# Patient Record
Sex: Male | Born: 1960 | Race: White | Hispanic: No | Smoking: Never smoker
Health system: Southern US, Community
[De-identification: ages and names within clinical notes are randomized; demographics above are authoritative.]

## PROBLEM LIST (undated history)

## (undated) ENCOUNTER — Emergency Department (HOSPITAL_COMMUNITY): Admission: EM | Payer: Self-pay | Source: Home / Self Care

## (undated) DIAGNOSIS — F419 Anxiety disorder, unspecified: Secondary | ICD-10-CM

## (undated) DIAGNOSIS — J90 Pleural effusion, not elsewhere classified: Secondary | ICD-10-CM

## (undated) DIAGNOSIS — Z9889 Other specified postprocedural states: Secondary | ICD-10-CM

## (undated) DIAGNOSIS — I10 Essential (primary) hypertension: Secondary | ICD-10-CM

## (undated) DIAGNOSIS — M51379 Other intervertebral disc degeneration, lumbosacral region without mention of lumbar back pain or lower extremity pain: Secondary | ICD-10-CM

## (undated) DIAGNOSIS — F32A Depression, unspecified: Secondary | ICD-10-CM

## (undated) DIAGNOSIS — F329 Major depressive disorder, single episode, unspecified: Secondary | ICD-10-CM

## (undated) DIAGNOSIS — I639 Cerebral infarction, unspecified: Secondary | ICD-10-CM

## (undated) DIAGNOSIS — K7290 Hepatic failure, unspecified without coma: Secondary | ICD-10-CM

## (undated) DIAGNOSIS — I429 Cardiomyopathy, unspecified: Secondary | ICD-10-CM

## (undated) DIAGNOSIS — F1011 Alcohol abuse, in remission: Secondary | ICD-10-CM

## (undated) DIAGNOSIS — K7460 Unspecified cirrhosis of liver: Secondary | ICD-10-CM

## (undated) DIAGNOSIS — I480 Paroxysmal atrial fibrillation: Secondary | ICD-10-CM

## (undated) DIAGNOSIS — M549 Dorsalgia, unspecified: Secondary | ICD-10-CM

## (undated) DIAGNOSIS — G8929 Other chronic pain: Secondary | ICD-10-CM

## (undated) DIAGNOSIS — E785 Hyperlipidemia, unspecified: Secondary | ICD-10-CM

## (undated) DIAGNOSIS — M5137 Other intervertebral disc degeneration, lumbosacral region: Secondary | ICD-10-CM

## (undated) HISTORY — DX: Other chronic pain: G89.29

## (undated) HISTORY — DX: Other intervertebral disc degeneration, lumbosacral region without mention of lumbar back pain or lower extremity pain: M51.379

## (undated) HISTORY — DX: Dorsalgia, unspecified: M54.9

## (undated) HISTORY — DX: Alcohol abuse, in remission: F10.11

## (undated) HISTORY — DX: Essential (primary) hypertension: I10

## (undated) HISTORY — DX: Major depressive disorder, single episode, unspecified: F32.9

## (undated) HISTORY — DX: Hyperlipidemia, unspecified: E78.5

## (undated) HISTORY — DX: Anxiety disorder, unspecified: F41.9

## (undated) HISTORY — DX: Other intervertebral disc degeneration, lumbosacral region: M51.37

## (undated) HISTORY — DX: Depression, unspecified: F32.A

## (undated) HISTORY — DX: Other specified postprocedural states: Z98.890

## (undated) HISTORY — DX: Cerebral infarction, unspecified: I63.9

---

## 1994-02-14 DIAGNOSIS — Z9889 Other specified postprocedural states: Secondary | ICD-10-CM

## 1994-02-14 HISTORY — PX: CARDIAC CATHETERIZATION: SHX172

## 1994-02-14 HISTORY — DX: Other specified postprocedural states: Z98.890

## 2001-02-14 DIAGNOSIS — I639 Cerebral infarction, unspecified: Secondary | ICD-10-CM

## 2001-02-14 HISTORY — DX: Cerebral infarction, unspecified: I63.9

## 2001-06-04 ENCOUNTER — Inpatient Hospital Stay (HOSPITAL_COMMUNITY): Admission: EM | Admit: 2001-06-04 | Discharge: 2001-06-05 | Payer: Self-pay | Admitting: *Deleted

## 2001-08-27 ENCOUNTER — Encounter: Payer: Self-pay | Admitting: *Deleted

## 2001-08-27 ENCOUNTER — Emergency Department (HOSPITAL_COMMUNITY): Admission: EM | Admit: 2001-08-27 | Discharge: 2001-08-27 | Payer: Self-pay | Admitting: *Deleted

## 2001-10-29 ENCOUNTER — Emergency Department (HOSPITAL_COMMUNITY): Admission: EM | Admit: 2001-10-29 | Discharge: 2001-10-29 | Payer: Self-pay | Admitting: Emergency Medicine

## 2002-07-02 ENCOUNTER — Emergency Department (HOSPITAL_COMMUNITY): Admission: EM | Admit: 2002-07-02 | Discharge: 2002-07-02 | Payer: Self-pay | Admitting: Emergency Medicine

## 2003-03-17 ENCOUNTER — Inpatient Hospital Stay (HOSPITAL_COMMUNITY): Admission: EM | Admit: 2003-03-17 | Discharge: 2003-03-18 | Payer: Self-pay | Admitting: Emergency Medicine

## 2003-03-21 ENCOUNTER — Ambulatory Visit (HOSPITAL_COMMUNITY): Admission: RE | Admit: 2003-03-21 | Discharge: 2003-03-21 | Payer: Self-pay | Admitting: Neurology

## 2007-07-24 ENCOUNTER — Emergency Department (HOSPITAL_COMMUNITY): Admission: EM | Admit: 2007-07-24 | Discharge: 2007-07-24 | Payer: Self-pay | Admitting: Emergency Medicine

## 2009-08-30 ENCOUNTER — Encounter: Payer: Self-pay | Admitting: Physician Assistant

## 2009-08-31 ENCOUNTER — Encounter: Payer: Self-pay | Admitting: Physician Assistant

## 2009-09-09 ENCOUNTER — Ambulatory Visit: Payer: Self-pay | Admitting: Family Medicine

## 2009-09-09 ENCOUNTER — Encounter: Payer: Self-pay | Admitting: Physician Assistant

## 2009-09-09 DIAGNOSIS — I10 Essential (primary) hypertension: Secondary | ICD-10-CM

## 2009-09-09 DIAGNOSIS — M5137 Other intervertebral disc degeneration, lumbosacral region: Secondary | ICD-10-CM

## 2009-09-09 DIAGNOSIS — R109 Unspecified abdominal pain: Secondary | ICD-10-CM | POA: Insufficient documentation

## 2009-09-09 DIAGNOSIS — R748 Abnormal levels of other serum enzymes: Secondary | ICD-10-CM | POA: Insufficient documentation

## 2009-09-11 ENCOUNTER — Ambulatory Visit: Payer: Self-pay | Admitting: Gastroenterology

## 2009-09-11 ENCOUNTER — Ambulatory Visit (HOSPITAL_COMMUNITY): Admission: RE | Admit: 2009-09-11 | Discharge: 2009-09-11 | Payer: Self-pay | Admitting: Internal Medicine

## 2009-09-11 ENCOUNTER — Encounter: Payer: Self-pay | Admitting: Gastroenterology

## 2009-09-14 ENCOUNTER — Encounter: Payer: Self-pay | Admitting: Gastroenterology

## 2009-09-15 ENCOUNTER — Telehealth: Payer: Self-pay | Admitting: Physician Assistant

## 2009-09-22 LAB — CONVERTED CEMR LAB
ALT: 91 units/L — ABNORMAL HIGH (ref 0–53)
AST: 125 units/L — ABNORMAL HIGH (ref 0–37)
Albumin: 4.7 g/dL (ref 3.5–5.2)
BUN: 14 mg/dL (ref 6–23)
CO2: 24 meq/L (ref 19–32)
Calcium: 9.4 mg/dL (ref 8.4–10.5)
Chloride: 99 meq/L (ref 96–112)
Creatinine, Ser: 1.05 mg/dL (ref 0.40–1.50)
HCV Ab: NEGATIVE
Hepatitis B Surface Ag: NEGATIVE
Potassium: 4.7 meq/L (ref 3.5–5.3)

## 2009-09-23 ENCOUNTER — Ambulatory Visit (HOSPITAL_COMMUNITY): Admission: RE | Admit: 2009-09-23 | Discharge: 2009-09-23 | Payer: Self-pay | Admitting: Gastroenterology

## 2009-09-23 ENCOUNTER — Ambulatory Visit: Payer: Self-pay | Admitting: Gastroenterology

## 2009-09-24 ENCOUNTER — Telehealth: Payer: Self-pay | Admitting: Physician Assistant

## 2009-09-30 ENCOUNTER — Ambulatory Visit: Payer: Self-pay | Admitting: Family Medicine

## 2009-10-01 ENCOUNTER — Ambulatory Visit (HOSPITAL_COMMUNITY): Admission: RE | Admit: 2009-10-01 | Discharge: 2009-10-01 | Payer: Self-pay | Admitting: Urology

## 2010-01-21 ENCOUNTER — Inpatient Hospital Stay (HOSPITAL_COMMUNITY): Admission: EM | Admit: 2010-01-21 | Discharge: 2009-09-01 | Payer: Self-pay | Admitting: Emergency Medicine

## 2010-03-18 NOTE — Progress Notes (Signed)
Summary: referral ortho  Phone Note Call from Patient   Summary of Call: can pt see ortho instead of neurosurgeon. Wants to see harrison. [phone] Initial call taken by: Rudene Anda,  September 15, 2009 8:24 AM  Follow-up for Phone Call        He could see Dr Romeo Apple, but I dont think he does the back injections like he had previously. If he wants to try the injections again then we could refer to Dr Eduard Clos.   Is his previous neurosurg no longer in practice? Follow-up by: Esperanza Sheets PA,  September 15, 2009 9:57 AM  Additional Follow-up for Phone Call Additional follow up Details #1::        called patient, left message Additional Follow-up by: Adella Hare LPN,  September 16, 2009 2:03 PM    Additional Follow-up for Phone Call Additional follow up Details #2::    called patient, left message Follow-up by: Adella Hare LPN,  September 21, 2009 8:28 AM

## 2010-03-18 NOTE — Assessment & Plan Note (Signed)
Summary: LEFT FLANK PAIN,ABNORMAL SERUM ENZYM LEVELS/CONSULT FOR TCS/SS   Visit Type:  Consult Referring Provider:  Syliva Overman Primary Care Provider:  Syliva Overman  Chief Complaint:  L side abd pain and goes around to back.  History of Present Illness: [name] is a pleasant 50 y/o WM, patient of Dr. Lodema Hong, who presents for further evaluation of left flank pain and abnormal LFTs. He was hospitalized a couple of weeks ago when the pain first started. He had CT which was abnormal with cecum in mid-abdomen and abnormal pneumatosis and adjacent mesenteric stranding. Appendix was normal. No free air. There was larger collection of gas along upper margin of cecum which could be luminal gas or pneumatosis. There was diverticula and fatty liver. Patient was seen by Dr. Caesar Bookman. No indication for surgery found but recommended to have colonoscopy.  Pain has persisted. It is no better. He notes pain in left flank. No dysuria or hematuria. BM 2 per day without melena, brbpr. No heartburn. No skin rash. He notes tremors since d/c from hospital. He wonders if it is from stress and pain. He has alcohol of heavy alcohol mostly on weekends. Drinks liquour (1/5th) and some beers. Drinks during the week some as well. Pain unrelated to meals. Sometimes worse with movement. H/O DDD, lumbar region but usually with back pain, pain center and radiated to buttocks.  Last alcohol yesterday, couple of mixed drinks. 24 ounces beer Tuesday. States his is stressed about recent loss of brother and son going to prison for extended period of time.  Current Medications (verified): 1)  Oxycodone-Acetaminophen 5-325 Mg Tabs (Oxycodone-Acetaminophen) .... Take 1 Every 6 Hrs As Needed For Pain 2)  Ibuprofen 200 Mg Tabs (Ibuprofen) .... As Needed  Allergies (verified): 1)  ! * Bee Stings  Past History:  Past Medical History: possible heart attack- 36 and 80 possible stroke- 2003 Hypertension DDD lumbar  spine  Past Surgical History: Heart cath- 96, Dr. Nicholaus Bloom  Family History: mother living- heart dz father deceased- cancer brother deceased- melanoma sister living- ? No FH of liver disease Paternal uncle, colon cancer  Social History: Self employed- odd jobs Single One grown child Never Smoked Alcohol use- weekends, 1/5 each week, couple beers on weekend. Drug use-no Regular exercise-yes  Review of Systems General:  Denies fever, chills, sweats, anorexia, fatigue, weakness, and weight loss. Eyes:  Denies vision loss. ENT:  Denies nasal congestion, sore throat, hoarseness, and difficulty swallowing. CV:  Denies chest pains, angina, palpitations, dyspnea on exertion, and peripheral edema. Resp:  Denies dyspnea at rest, dyspnea with exercise, cough, sputum, and wheezing. GI:  See HPI. GU:  Denies urinary burning, blood in urine, urinary frequency, and urinary hesitancy. MS:  Denies joint pain / LOM and low back pain. Derm:  Denies rash and itching. Neuro:  Denies weakness, frequent headaches, memory loss, and confusion. Psych:  Complains of anxiety; denies depression. Endo:  Denies unusual weight change. Heme:  Denies bruising and bleeding. Allergy:  Denies hives and rash.  Vital Signs:  Patient profile:   50 year old male Height:      76.5 inches Weight:      209 pounds BMI:     25.20 Temp:     98.2 degrees F oral Pulse rate:   80 / minute BP sitting:   128 / 88  (left arm) Cuff size:   regular  Vitals Entered By: Hendricks Limes LPN (September 11, 2009 11:22 AM)  Physical Exam  General:  Well developed,  well nourished, no acute distress. Head:  Normocephalic and atraumatic. Eyes:  Conjunctivae pink, no scleral icterus.  Mouth:  Oropharyngeal mucosa moist, pink.  No lesions, erythema or exudate.    Neck:  Supple; no masses or thyromegaly. Lungs:  Clear throughout to auscultation. Heart:  Regular rate and rhythm; no murmurs, rubs,  or bruits. Abdomen:  Soft. Pain in  left flank to palpation. No rebound or guarding. No CVA tenderness. No rash. No abd bruit or hernia, HSM or masses.  Rectal:  deferred until time of colonoscopy.   Extremities:  No clubbing, cyanosis, edema or deformities noted. Neurologic:  Alert and  oriented x4;  grossly normal neurologically. Skin:  Intact without significant lesions or rashes. Cervical Nodes:  No significant cervical adenopathy. Psych:  Alert and cooperative. Normal mood and affect.  Impression & Recommendations:  Problem # 1:  FLANK PAIN, LEFT (ICD-789.09)  Left flank pain unlikely related to GI source. Pain is unrelated to meals or BMs. ?referred pain from back. Given abnormal CT findings before and ongiong abd pain, will repeat CT prior to TCS.   Orders: Consultation Level IV (65784)  Problem # 2:  OTHER NONSPECIFIC ABNORMAL SERUM ENZYME LEVELS (ICD-790.5)  Likely secondary to ongoing alcohol use. Hep B and C markers are negative. Unlikely related to his pain. He does have fatty liver as well. Advise no further alcohol. Recheck LFTs in four weeks as planned by Dr. Lodema Hong.  Orders: Consultation Level IV (69629)  Problem # 3:  ALCOHOL ABUSE (ICD-305.00)  Patient with mild tremors today...persistent for couple weeks per his report. He desires to quit alcohol. Start Librium. No alcohol while using Librium. Advised to go to ED if tremors or pain worse.   Orders: Consultation Level IV ([id number]) Prescriptions: CHLORDIAZEPOXIDE HCL 25 MG CAPS (CHLORDIAZEPOXIDE HCL) one to two by mouth every 4 hours as needed anxiety/tremors. Do not exceed 10 per day. May cause drowsiness. Do not cosume alcohol.  #30 x 0   Entered and Authorized by:   [name]   Signed by:   [name] Battles Jenia Klepper PA-C on 09/11/2009   Method used:   Print then Give to Patient   RxID:   [id number]  I would like to thank Dr. Lodema Hong for allowing [place] to take part in the care of this nice patient.   Appended Document: LEFT FLANK  PAIN,ABNORMAL SERUM ENZYM LEVELS/CONSULT FOR TCS/SS Please schedule TCS with [facility] in OR due to h/o alcohol use. Reason for TCS, screening, left flank pain, abnormal CT. The sooner the better please.  Appended Document: LEFT FLANK PAIN,ABNORMAL SERUM ENZYM LEVELS/CONSULT FOR TCS/SS Spoke w/ pt.  Has appt w/ [facility] in OR on 8/10 @ 730.  Also aware of pre-op appt 8/8 @ 1230p.  Mother will be by to pick up RX and instructions per pt.  Appended Document: LEFT FLANK PAIN,ABNORMAL SERUM ENZYM LEVELS/CONSULT FOR TCS/SS Pt seen for pre-op. Pt anxious and shaking, BP 150/100, HR 90. Rx: Serax #10 1 by mouth q6h as needed anxiety, rfx0.

## 2010-03-18 NOTE — Assessment & Plan Note (Signed)
Summary: follow up - room 1   Vital Signs:  Patient profile:   50 year old male Height:      76.5 inches Weight:      216.75 pounds BMI:     26.13 O2 Sat:      99 % on Room air Pulse rate:   98 / minute Resp:     16 per minute BP sitting:   130 / 80  (left arm)  Vitals Entered By: Adella Hare LPN (September 30, 2009 1:17 PM) CC: follow-up visit Is Patient Diabetic? No Pain Assessment Patient in pain? yes     Location: left flank  Intensity: 6 Type: sharp Onset of pain  Constant Comments did not bring meds to ov   Referring Provider:  Syliva Overman Primary Provider:  Syliva Overman  CC:  follow-up visit.  History of Present Illness: Pt presents today for f/u. States he saw a urologist today.  Thinks he passed some kidney stones 5 days ago.  Still having intermittent Lt flank pain.  Per pt UA at urologist today nl.  Had blood work drawn & is having another CT scan tomorrow am.  He is out of pain meds.  Used them only when needed.  Pt has also seen GI and had colonoscopy done.  Is due for repeat liver enzymes in about 1 week.  Will need to see what was drawn at urologist office today to duplicate labs.  Pt states he has significantly cut back on alcohol.  Did drink about 10 beers this past Sat night.  Is struggling with the shakes when he doesnt drink.  He states that he doesnt feel like he is craving the alcohol, but more that he cant stand the shakes.  Dr Dian Situ prescribed Librium for him.  He states this really didnt help.  He did try his girlfriends Xanax though and this worked better for him.  No fever, nausea, vomiting or diarrhea.    Pt states he is taking a multivitamin  two times a day now.   Allergies: 1)  ! * Bee Stings  Past History:  Past medical history reviewed for relevance to current acute and chronic problems.  Past Medical History: Reviewed history from 09/11/2009 and no changes required. possible heart attack- 89 and 89 possible stroke-  2003 Hypertension DDD lumbar spine  Review of Systems General:  Denies chills and fever. CV:  Denies chest pain or discomfort and shortness of breath with exertion. Resp:  Denies shortness of breath. GI:  Denies abdominal pain, change in bowel habits, nausea, and vomiting. Psych:  Denies suicidal thoughts/plans and thoughts /plans of harming others.  Physical Exam  General:  Well-developed,well-nourished,in no acute distress; alert,appropriate and cooperative throughout examination Head:  Normocephalic and atraumatic without obvious abnormalities. No apparent alopecia or balding. Ears:  External ear exam shows no significant lesions or deformities.  Otoscopic examination reveals clear canals, tympanic membranes are intact bilaterally without bulging, retraction, inflammation or discharge. Hearing is grossly normal bilaterally. Nose:  External nasal examination shows no deformity or inflammation. Nasal mucosa are pink and moist without lesions or exudates. Mouth:  Oral mucosa and oropharynx without lesions or exudates.   Neck:  No deformities, masses, or tenderness noted. Lungs:  Normal respiratory effort, chest expands symmetrically. Lungs are clear to auscultation, no crackles or wheezes. Heart:  Normal rate and regular rhythm. S1 and S2 normal without gallop, murmur, click, rub or other extra sounds. Msk:  LS spine:  Nontender lumbar spinous processes and  musculature.  Is TTP posterolateral ribs/ flank. Pulses:  R and L carotid,radial,femoral,dorsalis pedis and posterior tibial pulses are full and equal bilaterally Neurologic:  alert & oriented X3 and gait normal.  Tremors noted. Cervical Nodes:  No lymphadenopathy noted Psych:  Cognition and judgment appear intact. Alert and cooperative with normal attention span and concentration. No apparent delusions, illusions, hallucinations   Impression & Recommendations:  Problem # 1:  FLANK PAIN, LEFT (ICD-789.09) Assessment Unchanged Renal  vs musculoskeletal.  Pt is seeing urologist.  Will await his evaluation.  His updated medication list for this problem includes:    Oxycodone-acetaminophen 5-325 Mg Tabs (Oxycodone-acetaminophen) .Marland Kitchen... Take 1 every 6 hrs as needed for pain    Ibuprofen 200 Mg Tabs (Ibuprofen) .Marland Kitchen... As needed  Problem # 2:  ALCOHOL WITHDRAWAL (ICD-291.81) Assessment: New Encouraged AA mtgs.  Pt resistant.  Feels that if he can get past the shakes he will do OK.  Discussed use of Xanax is short term only, and has the potential to be habit forming.  Advised pt not to consume ETOH and take his oxycodone or Xanax.  Problem # 3:  OTHER NONSPECIFIC ABNORMAL SERUM ENZYME LEVELS (ICD-790.5) Assessment: Comment Only F/u labs due approx 1 week.  Will see what is included in blood work drawn by urologist today.    Complete Medication List: 1)  Oxycodone-acetaminophen 5-325 Mg Tabs (Oxycodone-acetaminophen) .... Take 1 every 6 hrs as needed for pain 2)  Ibuprofen 200 Mg Tabs (Ibuprofen) .... As needed 3)  Alprazolam 0.5 Mg Tabs (Alprazolam) .... Take 1 tablet every 8 hours as needed  Patient Instructions: 1)  Please schedule a follow-up appointment in 2 weeks. 2)  I have prescribed Alprazolam (Xanax) to help with your shakes from alcohol withdrawal.  As we discussed this is for short term use and has the potential to become habit forming. 3)  I have refilled your pain medication also.  Use this as neede for pain. 4)  We will wait to see what the Urologist finds, and your test results before sending you to a back doctor. Prescriptions: ALPRAZOLAM 0.5 MG TABS (ALPRAZOLAM) take 1 tablet every 8 hours as needed  #30 x 0   Entered and Authorized by:   Esperanza Sheets PA   Signed by:   Esperanza Sheets PA on 09/30/2009   Method used:   Print then Give to Patient   RxID:   [id number] OXYCODONE-ACETAMINOPHEN 5-325 MG TABS (OXYCODONE-ACETAMINOPHEN) take 1 every 6 hrs as needed for pain  #40 x 0   Entered and Authorized by:    Esperanza Sheets PA   Signed by:   Esperanza Sheets PA on 09/30/2009   Method used:   Print then Give to Patient   RxID:   [id number]

## 2010-03-18 NOTE — Letter (Signed)
Summary: ct order  ct order   Imported By: Hendricks Limes LPN 04/54/0981 19:14:78  _____________________________________________________________________  External Attachment:    Type:   Image     Comment:   External Document

## 2010-03-18 NOTE — Letter (Signed)
Summary: TCS order  TCS order   Imported By: Minna Merritts 09/14/2009 15:22:38  _____________________________________________________________________  External Attachment:    Type:   Image     Comment:   External Document

## 2010-03-18 NOTE — Progress Notes (Signed)
  Phone Note Outgoing Call   Summary of Call: Call pt .  He needs a follow up appt with me the end of August. Initial call taken by: Esperanza Sheets PA,  September 24, 2009 9:14 AM  Follow-up for Phone Call        no answer x2 days will send a letter Follow-up by: Lind Guest,  September 25, 2009 2:43 PM  Additional Follow-up for Phone Call Additional follow up Details #1::        Called office and Luann to schedule Additional Follow-up by: Everitt Amber LPN,  September 25, 2009 2:46 PM     Appended Document:  appt 8.17.11 with dawn sampson

## 2010-03-18 NOTE — Assessment & Plan Note (Signed)
Summary: new patient- room 1   Vital Signs:  Patient profile:   50 year old male Height:      76.5 inches Weight:      211 pounds BMI:     25.44 O2 Sat:      97 % on Room air Pulse rate:   100 / minute Resp:     16 per minute BP sitting:   104 / 60  (left arm)  Vitals Entered By: Adella Hare LPN (September 09, 2009 9:14 AM) CC: new patient Is Patient Diabetic? No Pain Assessment Patient in pain? yes     Location: left flank Intensity: 7 Type: aching Onset of pain  Constant   CC:  new patient.  History of Present Illness: New pt here to establish care with new PCP. Pt was admitted last week for Lt flank pain. He states the pain started about 3 weeks ago as an aching and has worsened.  No radiation.  Constant pain but sometimes sharp.  It hurts to walk.  Bending forward increases pain, but no change with twisting.  + HS awakening due to pain. No nausea or vomiting.  BM's nl.  No blood or melena. Urination has been normal.  States 1 x yesterday looked like pepper in the commode after urinating.  Hx of htn.  Was on Toprol in the past, but hasnt taken it x yrs.  Has seen Dr Tresa Endo, cardiologist, many yrs ago.  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  Saw neurosurg in [place] previously.  Has had injections.  Last seen 7-8 yrs ago.  Labs, Abd & CT Pelvis, UA and Surg Consult reviewed.  Hosp dischg summary not avail.      Current Medications (verified): 1)  Oxycodone-Acetaminophen 5-325 Mg Tabs (Oxycodone-Acetaminophen) .... One To Two Tablets By Mouth Every Four Hours As Needed  Allergies (verified): 1)  ! * Bee Stings  Past History:  Past medical, surgical, family and social histories (including risk factors) reviewed for relevance to current acute and chronic problems.  Past Medical History: possible heart attack- 1 and 89 possible stroke- 2003 Hypertension  Past Surgical History: Heart cath- 96 PMH reviewed for relevance  Family  History: Reviewed history and no changes required. mother living- heart dz father deceased- cancer brother deceased- melanoma sister living- ?  Social History: Reviewed history and no changes required. Self employed- odd jobs Single One grown child Never Smoked Alcohol use- weekends, socially Drug use-no Regular exercise-yes Smoking Status:  never Drug Use:  no Does Patient Exercise:  yes  Review of Systems General:  Denies chills and fever. CV:  Denies chest pain or discomfort. Resp:  Denies cough and shortness of breath. GI:  Denies abdominal pain, bloody stools, change in bowel habits, dark tarry stools, nausea, and vomiting. GU:  Denies dysuria, hematuria, and urinary frequency. MS:  Complains of low back pain; PAIN LT FLANK AREA. Neuro:  Denies numbness and tingling.  Physical Exam  General:  alert, well-developed, well-nourished, well-hydrated, and uncomfortable-appearing.   Head:  Normocephalic and atraumatic without obvious abnormalities. No apparent alopecia or balding. Ears:  External ear exam shows no significant lesions or deformities.  Otoscopic examination reveals clear canals, tympanic membranes are intact bilaterally without bulging, retraction, inflammation or discharge. Hearing is grossly normal bilaterally. Nose:  External nasal examination shows no deformity or inflammation. Nasal mucosa are pink and moist without lesions or exudates. Mouth:  Oral mucosa and oropharynx without lesions or exudates.  Teeth  in good repair. Neck:  No deformities, masses, or tenderness noted. Lungs:  Normal respiratory effort, chest expands symmetrically. Lungs are clear to auscultation, no crackles or wheezes. Heart:  Normal rate and regular rhythm. S1 and S2 normal without gallop, murmur, click, rub or other extra sounds. Abdomen:  soft, normal bowel sounds, and no masses.  Liver palp 1-2 finger breadths inferior to CVA.  TTP Lateral Lt mid abd without guarding.  Is still TTP  with abd muscles flexed. Msk:  LS Spine:  FROM.  Pt able to stand on heels and toes, but reports increased pain when standing on toes.  Nontender to palp thoracic and lumbar spinous processes, paraspinal muscles, SI joints and sciatic notches bilat.  Does have soft tissue TTP Lateral Lt back/flank. Neurologic:  alert & oriented X3.  Pt changes positions slowly, and gait is slow and slightly hunched forward. Cervical Nodes:  No lymphadenopathy noted Psych:  Cognition and judgment appear intact. Alert and cooperative with normal attention span and concentration. No apparent delusions, illusions, hallucinations   Impression & Recommendations:  Problem # 1:  FLANK PAIN, LEFT (ICD-789.09) Assessment Unchanged GI and GU causes have been excluded.  Suspect pain is due to Lumbar DDD.  His updated medication list for this problem includes:    Oxycodone-acetaminophen 5-325 Mg Tabs (Oxycodone-acetaminophen) ..... One to two tablets by mouth every four hours as needed    Oxycodone-acetaminophen 5-325 Mg Tabs (Oxycodone-acetaminophen) .Marland Kitchen... Take 1 every 6 hrs as needed for pain  Orders: Gastroenterology Referral (GI) Neurosurgeon Referral (Neurosurgeon) T-Comprehensive Metabolic Panel [phone])  Problem # 2:  DEGENERATIVE DISC DISEASE, LUMBAR SPINE (ICD-722.52) Assessment: Comment Only Will refer pt back to neurosurg for eval & mgmt.  Orders: Neurosurgeon Referral (Neurosurgeon)  Problem # 3:  OTHER NONSPECIFIC ABNORMAL SERUM ENZYME LEVELS (ICD-790.5) Assessment: New Discussed with pt his elevated LFTs.  Discussed that this is most likely due to ETOH and recommended he abstain.  Orders: Gastroenterology Referral (GI) T-Comprehensive Metabolic Panel [phone]) T-Hepatitis Profile Acute ([phone])  Problem # 4:  HYPERTENSION (ICD-401.9) Assessment: Improved Hx of.  BP currently controlled.  Problem # 5:  ALCOHOL ABUSE (ICD-305.00) Assessment: Comment Only  Complete Medication  List: 1)  Oxycodone-acetaminophen 5-325 Mg Tabs (Oxycodone-acetaminophen) .... One to two tablets by mouth every four hours as needed 2)  Oxycodone-acetaminophen 5-325 Mg Tabs (Oxycodone-acetaminophen) .... Take 1 every 6 hrs as needed for pain  Patient Instructions: 1)  Please schedule a follow-up appointment in 1 month. 2)  I recommend you avoid alcohol due to your abnormal liver lab tests. 3)  I am referring you back to the back specialist you have seen before. 4)  I am referring you to GI 5)  I have refilled your pain medication to get you through until you see your back dr. Prescriptions: OXYCODONE-ACETAMINOPHEN 5-325 MG TABS (OXYCODONE-ACETAMINOPHEN) take 1 every 6 hrs as needed for pain  #40 x 0   Entered and Authorized by:   Esperanza Sheets PA   Signed by:   Esperanza Sheets PA on 09/09/2009   Method used:   Print then Give to Patient   RxID:   [id number]

## 2010-04-18 ENCOUNTER — Emergency Department (HOSPITAL_COMMUNITY)
Admission: EM | Admit: 2010-04-18 | Discharge: 2010-04-18 | Disposition: A | Discharge status: Home / Self Care | Payer: Self-pay | Attending: Emergency Medicine | Admitting: Emergency Medicine

## 2010-04-18 DIAGNOSIS — M79609 Pain in unspecified limb: Secondary | ICD-10-CM | POA: Insufficient documentation

## 2010-04-18 DIAGNOSIS — R209 Unspecified disturbances of skin sensation: Secondary | ICD-10-CM | POA: Insufficient documentation

## 2010-04-18 DIAGNOSIS — M549 Dorsalgia, unspecified: Secondary | ICD-10-CM | POA: Insufficient documentation

## 2010-05-01 LAB — DIFFERENTIAL
Basophils Absolute: 0 10*3/uL (ref 0.0–0.1)
Basophils Relative: 0 % (ref 0–1)
Eosinophils Absolute: 0.1 10*3/uL (ref 0.0–0.7)
Eosinophils Relative: 1 % (ref 0–5)
Lymphs Abs: 2 10*3/uL (ref 0.7–4.0)
Monocytes Absolute: 1.2 10*3/uL — ABNORMAL HIGH (ref 0.1–1.0)
Monocytes Relative: 16 % — ABNORMAL HIGH (ref 3–12)
Neutro Abs: 2.3 10*3/uL (ref 1.7–7.7)
Neutrophils Relative %: 44 % (ref 43–77)

## 2010-05-01 LAB — URINALYSIS, ROUTINE W REFLEX MICROSCOPIC
Bilirubin Urine: NEGATIVE
Ketones, ur: NEGATIVE mg/dL
Nitrite: NEGATIVE
Protein, ur: NEGATIVE mg/dL
Urobilinogen, UA: 0.2 mg/dL (ref 0.0–1.0)
pH: 5.5 (ref 5.0–8.0)

## 2010-05-01 LAB — BASIC METABOLIC PANEL
BUN: 29 mg/dL — ABNORMAL HIGH (ref 6–23)
CO2: 24 mEq/L (ref 19–32)
Calcium: 8.3 mg/dL — ABNORMAL LOW (ref 8.4–10.5)
Chloride: 108 mEq/L (ref 96–112)
Creatinine, Ser: 1.51 mg/dL — ABNORMAL HIGH (ref 0.4–1.5)
GFR calc Af Amer: >60 mL/min (ref >60)
GFR calc non Af Amer: >60 mL/min (ref >60)
Glucose, Bld: 127 mg/dL — ABNORMAL HIGH (ref 70–99)
Potassium: 3.9 mEq/L (ref 3.5–5.1)
Potassium: 4.3 mEq/L (ref 3.5–5.1)
Sodium: 141 mEq/L (ref 135–145)

## 2010-05-01 LAB — URINE CULTURE: Colony Count: NO GROWTH

## 2010-05-01 LAB — HEPATIC FUNCTION PANEL
ALT: 165 U/L — ABNORMAL HIGH (ref 0–53)
Albumin: 4.3 g/dL (ref 3.5–5.2)
Indirect Bilirubin: 0.5 mg/dL (ref 0.3–0.9)
Total Protein: 8 g/dL (ref 6.0–8.3)

## 2010-05-01 LAB — CBC
HCT: 42.3 % (ref 39.0–52.0)
HCT: 44.7 % (ref 39.0–52.0)
Hemoglobin: 14.3 g/dL (ref 13.0–17.0)
MCH: 35.4 pg — ABNORMAL HIGH (ref 26.0–34.0)
MCH: 35.8 pg — ABNORMAL HIGH (ref 26.0–34.0)
MCHC: 34.4 g/dL (ref 30.0–36.0)
MCV: 104.1 fL — ABNORMAL HIGH (ref 78.0–100.0)
Platelets: 140 10*3/uL — ABNORMAL LOW (ref 150–400)
RBC: 4.04 MIL/uL — ABNORMAL LOW (ref 4.22–5.81)
RDW: 14.1 % (ref 11.5–15.5)
WBC: 7.7 10*3/uL (ref 4.0–10.5)

## 2010-05-01 LAB — ETHANOL: Alcohol, Ethyl (B): 417 mg/dL (ref 0–10)

## 2010-05-05 ENCOUNTER — Emergency Department (HOSPITAL_COMMUNITY)
Admission: EM | Admit: 2010-05-05 | Discharge: 2010-05-05 | Disposition: A | Discharge status: Home / Self Care | Payer: Self-pay | Attending: Emergency Medicine | Admitting: Emergency Medicine

## 2010-05-05 DIAGNOSIS — M545 Low back pain, unspecified: Secondary | ICD-10-CM | POA: Insufficient documentation

## 2010-05-05 DIAGNOSIS — IMO0002 Reserved for concepts with insufficient information to code with codable children: Secondary | ICD-10-CM | POA: Insufficient documentation

## 2010-05-07 ENCOUNTER — Emergency Department (HOSPITAL_COMMUNITY)
Admission: EM | Admit: 2010-05-07 | Discharge: 2010-05-07 | Disposition: A | Discharge status: Home / Self Care | Payer: Self-pay | Attending: Emergency Medicine | Admitting: Emergency Medicine

## 2010-05-07 DIAGNOSIS — I10 Essential (primary) hypertension: Secondary | ICD-10-CM | POA: Insufficient documentation

## 2010-05-07 DIAGNOSIS — Z8679 Personal history of other diseases of the circulatory system: Secondary | ICD-10-CM | POA: Insufficient documentation

## 2010-05-07 DIAGNOSIS — M549 Dorsalgia, unspecified: Secondary | ICD-10-CM | POA: Insufficient documentation

## 2010-05-07 DIAGNOSIS — G8929 Other chronic pain: Secondary | ICD-10-CM | POA: Insufficient documentation

## 2010-05-07 DIAGNOSIS — I252 Old myocardial infarction: Secondary | ICD-10-CM | POA: Insufficient documentation

## 2010-05-15 ENCOUNTER — Emergency Department (HOSPITAL_COMMUNITY)
Admission: EM | Admit: 2010-05-15 | Discharge: 2010-05-15 | Disposition: A | Discharge status: Home / Self Care | Payer: Self-pay | Attending: Emergency Medicine | Admitting: Emergency Medicine

## 2010-05-15 DIAGNOSIS — G8929 Other chronic pain: Secondary | ICD-10-CM | POA: Insufficient documentation

## 2010-05-15 DIAGNOSIS — M549 Dorsalgia, unspecified: Secondary | ICD-10-CM | POA: Insufficient documentation

## 2010-05-20 ENCOUNTER — Telehealth: Payer: Self-pay | Admitting: Physician Assistant

## 2010-05-20 NOTE — Telephone Encounter (Signed)
No medication till he is seen, he has not been here since August 2011, I suggest urgent care  Until he is able to be evaluated here. He was given 2 prescriptions only from this office last year in the Summer

## 2010-05-20 NOTE — Telephone Encounter (Signed)
Will advise patient. °

## 2010-05-24 ENCOUNTER — Telehealth: Payer: Self-pay | Admitting: Physician Assistant

## 2010-05-24 ENCOUNTER — Ambulatory Visit: Payer: Self-pay | Admitting: Family Medicine

## 2010-05-24 NOTE — Telephone Encounter (Signed)
error 

## 2010-05-24 NOTE — Telephone Encounter (Signed)
His wife states that he has been in excruciating pain from his back and he had an appt this week and it was canceled until June. She said he cannot wait because he has no insurance and can't go to the urgent care or ER. Needs something to last until his rescheduled appt. Wife was upset.

## 2010-05-24 NOTE — Telephone Encounter (Signed)
pls let pt/know I am sorry there are no appts available, I suggest  He goes to the ED  For evaluation and management of his severe pain

## 2010-05-25 ENCOUNTER — Telehealth: Payer: Self-pay | Admitting: Physician Assistant

## 2010-05-25 NOTE — Telephone Encounter (Signed)
Was requesting pain meds but Dr. Lodema Hong has never seen this patient (he was Dawn's patient). He was here last 7 months ago and per Dr Lodema Hong, advised urgent care or ER for pain relief until OV

## 2010-06-16 NOTE — Telephone Encounter (Signed)
error 

## 2010-07-02 NOTE — H&P (Signed)
[name], [name]                         ACCOUNT NO.:  [id number]   MEDICAL RECORD NO.:  [id number]                   PATIENT TYPE:  EMS   LOCATION:  ED                                   FACILITY:  [facility]   PHYSICIAN:  Vania Rea, M.D.              DATE OF BIRTH:  Apr 06, 1960   DATE OF ADMISSION:  03/17/2003  DATE OF DISCHARGE:                                HISTORY & PHYSICAL   PRIMARY CARE PHYSICIAN:  Unassigned.   CHIEF COMPLAINT:  Episode of blindness yesterday evening.   HISTORY OF PRESENT ILLNESS:  This is a 50 year old Caucasian man with a  history of ETOH abuse.  He was playing cards with friends yesterday evening  when he had a sudden onset of total blindness for about 15 minutes.  The  patient says that everything went gray and he could not even see shadows.  There was no associated nausea, vomiting, or tinnitus.  There was no chest  pain or palpitations.  The patient was apparently crying in panic, although  he does not remember this.  The patient notes that after the episode he  tried to stand and felt off balance.  The patient has been noticing that the  back of his neck feels stiff.  He denies fever, cough, or pain.  The patient  drinks six to eight beers per day regularly and works as a Firefighter.  He says he can go without drinking for periods of  two weeks and has no unusual sequelae.  Never had DTs.  Denies chest pain  and shortness of breath.   The patient's medical record describe syncope associated with polysubstance  abuse when alcohol, benzodiazepines, and cocaine were found in his systems.  The patient says that apart from the alcohol, the substances were given to  him without him knowing and that he does not abuse drugs.   PAST MEDICAL HISTORY:  Significant for hypertension.   MEDICATIONS:  Toprol XL 50 mg daily.   ALLERGIES:  No known drug allergies.   SOCIAL HISTORY:  He has never used tobacco.  Alcohol as above.   Six to eight  packs per day for the past 25 years.  Denies drug abuse.  Works as a International aid/development worker  of wells.  Has a 39 year old child in good health.  He has one brother and  one sister, both in good health.  Has never been married.   FAMILY HISTORY:  His mother has a history of atrial fibrillation treated  with ablation.  His father died of cancer of the throat at age 48 in 66.   REVIEW OF SYSTEMS:  No further contributory.   PHYSICAL EXAMINATION:  GENERAL APPEARANCE:  An anxious young man sitting up  on the stretcher.  VITAL SIGNS:  Temperature 99.7 degrees, pulse 108, respirations 24, blood  pressure 150/92, saturation 98% on room air.  HEENT:  Pink and  mildly icteric.  Pupils are equal and reactive.  There is  no nystagmus.  His extraocular muscles are intact.  NECK:  There is no carotid bruit.  CHEST:  Clear to auscultation bilaterally.  CARDIOVASCULAR:  Regular rhythm.  No murmurs, rubs, or gallops.  ABDOMEN:  Soft and nontender.  No organomegaly.  EXTREMITIES:  No edema.  NERVOUS SYSTEM:  He is alert and oriented x 3.  He has a coarse generalized  tremor.  His multisensory systems are intact.  Deep tendon reflexes normal.   LABORATORY DATA:  White count 5.4 with a normal differential, hematocrit  47.9, MCV 97, RDW 13.5, platelets 214.  His chemistries are normal apart  from a glucose of 130.  His first set of troponins are normal.  His CT scan  is negative for any acute abnormality.   ASSESSMENT:  Acute bilateral blindness suggestive of central problem and  also ethanol abuse, although he seems to be in denial.   PLAN:  1. We will admit him for full neurologic workup, including an MRI.  2. Will gets laboratories for VDRL and vitamins.  3. Will do Accu-Cheks for 24 hours since he has elevated glucose.  4. We will replace vitamins B1, B11, and multivitamins.  5. Neurology consult.     ___________________________________________                                         Vania Rea, M.D.   LC/MEDQ  D:  03/17/2003  T:  03/17/2003  Job:  [id number]

## 2010-07-02 NOTE — Discharge Summary (Signed)
[place]  Patient:    [name], [name] Visit Number: [id number] MRN: [id number]          Service Type: MED Location: 2000 2004 01 Attending Physician:  Darlin Priestly Dictated by:   Marya Fossa, P.A. Admit Date:  06/04/2001 Disc. Date: 06/05/01   CC:         Lennette Bihari, M.D.   Discharge Summary  ADMISSION DIAGNOSES: 1. Syncope. 2. Polysubstance use. 3. Hypertension. 4. History of palpitations. 5. Remote normal coronaries.  DISCHARGE DIAGNOSES: 1. Syncope, secondary to polysubstance abuse, no cardiac arrhythmia    identified. 2. Polysubstance abuse - offered treatment program - patient refused. 3. Hypokalemia - repleted. 4. Hypertension. 5. History of palpitations. 6. Remote normal coronaries.  HISTORY OF PRESENT ILLNESS:  [name] is a 50 year old white male with a history of normal coronaries, occasional PVC, and hypertension.  He has seen Dr. Tresa Endo in the past, but not since 1999.  He is noncompliant with medications.  He got up this morning around 4:30 a.m., went to work to pick up some papers, went back home to get a suitcase since he was working out of town.  When he back out to the car, he promptly passed out.  He does not know how long he was out and had no prodrome.  Apparently a brother and cousin found him some time after 5:30 or 6 oclock.  The patient felt drowsy and anxious, but no chest pain, arm pain, disorientation, tongue bite or loss of bowel or bladder control.  The patient presented to [facility] for evaluation.  Upon presentation, he was hemodynamically stable, but nervous and hyperventilating.  He was treated with morphine and IV Lopressor for tachycardia in the low 110s.  His urine toxicology screen was positive for cocaine, amphetamines, benzodiazepines, and blood alcohol level was elevated at 225.  Because of the patients history of palpitations and hypertension, the emergency  room physician at [facility] felt it prudent to transfer him to [facility] for cardiac evaluation of syncope.  Upon arrival, the patient was stable.  EKG showed sinus rhythm with no acute ST or T wave abnormalities.  He will be admitted for telemetry observation to identify any potential arrhythmia.  Will check orthostatics.  Will recheck a urine toxicology screen. Will check cardiac enzymes x2.  Will treat him with beta blocker for blood pressure control and put him on DT precautions.  We have a low index of suspicion that this is cardiac and likely more related to polysubstance abuse.  PROCEDURE:  None.  CONSULTING PHYSICIANS:  Case management.  COMPLICATIONS:  None.  HOSPITAL COURSE:  [name] was admitted to [facility] on June 04, 2001, transferred from [place].  He was hemodynamically stable and experiencing no chest pain, no shortness of breath, and no lightheadedness or dizziness.  Orthostatics were done and were negative.  Admission labs showed a hemoglobin of 16.4 and platelets 220, potassium 3.4, BUN 9, creatinine 1.2. INR 1.1.  CPK elevated at 1194 and MB 8.2 with a relative index of 0.6 and troponin I of 0.01.  Cardiac enzymes were repeated and came back with a CK of 979, MB 5.6, relative index 0.6, and troponin 0.01.  His urine toxicology screen was repeated as the patient adamantly denied substance abuse and again was positive for cocaine, benzodiazepines, and amphetamines.  It was also positive for opiates, but he had received morphine in the emergency room at [facility].  He remained stable overnight and had no arrhythmia on telemetry.  On June 05, 2001, we asked care management to discuss inpatient and outpatient treatment programs for substance abuse with the patient.  The patient refused both programs.  We have found no cardiac etiology for the patients syncope and feel this is primarily related to substance abuse.  The  patient needs to establish himself with a primary care Joclyn Alsobrook.  DISCHARGE MEDICATIONS: 1. Labetolol 100 mg b.i.d. He should not take Toprol while taking labetolol. 2. Vitamin B1 100 mg a day.  ACTIVITY:  As tolerated.  DIET:  As before.  We have asked him to stop alcohol and substance abuse.  DISCHARGE INSTRUCTIONS:  He is to call with any problems or questions.  He is to keep his prior scheduled appointment with Dr. Tresa Endo and will need to establish himself with a primary care Topher Buenaventura. Dictated by:   Marya Fossa, P.A. Attending Physician:  Darlin Priestly DD:  06/05/01 TD:  06/05/01 Job: [id number]
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
## 2010-07-02 NOTE — Procedures (Signed)
[name], BABINGTON                         ACCOUNT NO.:  [id number]   MEDICAL RECORD NO.:  [id number]                   PATIENT TYPE:  OUT   LOCATION:  RESP                                 FACILITY:  [facility]   PHYSICIAN:  Kofi A. Gerilyn Pilgrim, M.D.              DATE OF BIRTH:  01/25/1961   DATE OF PROCEDURE:  DATE OF DISCHARGE:  03/21/2003                                EEG INTERPRETATION   INDICATIONS FOR PROCEDURE:  This is a 50 year old who is suspected of having  a seizure.   FINDINGS:  A 16 channel recording is conducted for approximately 20 minutes.  There is a posterior rhythm of 8 hertz, maximum __________ eye opening.  There is higher beat activity seen in the frontal areas. A significant  portion of the recording is observed during stage II sleep with sleep  spindles and K-complexes well formed. Photic stimulation does not elicit any  abnormal responses. There is no focal slowing or epileptiform activity seen.   IMPRESSION:  This recording essentially is unremarkable. There is no  evidence of epileptiform activity.      ___________________________________________                                            [name] Gerilyn Pilgrim, M.D.   KAD/MEDQ  D:  03/24/2003  T:  03/24/2003  Job:  [id number]

## 2010-07-16 ENCOUNTER — Encounter: Payer: Self-pay | Admitting: Physician Assistant

## 2010-07-19 ENCOUNTER — Encounter: Payer: Self-pay | Admitting: Family Medicine

## 2010-07-19 ENCOUNTER — Ambulatory Visit (INDEPENDENT_AMBULATORY_CARE_PROVIDER_SITE_OTHER): Payer: Self-pay | Admitting: Family Medicine

## 2010-07-19 VITALS — BP 124/84 | HR 97 | Resp 16 | Ht 74.5 in | Wt 224.1 lb

## 2010-07-19 DIAGNOSIS — Z23 Encounter for immunization: Secondary | ICD-10-CM

## 2010-07-19 DIAGNOSIS — F329 Major depressive disorder, single episode, unspecified: Secondary | ICD-10-CM

## 2010-07-19 DIAGNOSIS — R5383 Other fatigue: Secondary | ICD-10-CM

## 2010-07-19 DIAGNOSIS — M5137 Other intervertebral disc degeneration, lumbosacral region: Secondary | ICD-10-CM

## 2010-07-19 DIAGNOSIS — K429 Umbilical hernia without obstruction or gangrene: Secondary | ICD-10-CM | POA: Insufficient documentation

## 2010-07-19 DIAGNOSIS — F419 Anxiety disorder, unspecified: Secondary | ICD-10-CM | POA: Insufficient documentation

## 2010-07-19 DIAGNOSIS — M549 Dorsalgia, unspecified: Secondary | ICD-10-CM

## 2010-07-19 DIAGNOSIS — I10 Essential (primary) hypertension: Secondary | ICD-10-CM

## 2010-07-19 DIAGNOSIS — F1010 Alcohol abuse, uncomplicated: Secondary | ICD-10-CM

## 2010-07-19 DIAGNOSIS — Z125 Encounter for screening for malignant neoplasm of prostate: Secondary | ICD-10-CM

## 2010-07-19 MED ORDER — KETOROLAC TROMETHAMINE 60 MG/2ML IM SOLN
60.0000 mg | Freq: Once | INTRAMUSCULAR | Status: AC
  Administered 2010-07-19: 60 mg via INTRAMUSCULAR

## 2010-07-19 MED ORDER — FLUOXETINE HCL 10 MG PO CAPS: 10.0000 mg | ORAL_CAPSULE | Freq: Every day | ORAL | Status: DC

## 2010-07-19 NOTE — Patient Instructions (Addendum)
F/U jn  2.5 months.  Stop drinking  New med for depression.  Old meds as before  You are being referred to surgeon about the hernia, he will order any necessary tests  Labs fasting are due You will be referred for an mRI of your low back to evaluate the back pain radiating to your buttocks and causing numbness. Pain med is  prescribed, initially from this office and whenI have more info on your back if you need this , you will be referred to the specailalist you need. If the med I prescribe for pain is inadequate you will need to see a pain specialist, and I will be happy to have you see one. You cannot  get pain meds from any other prescriber while getting med from this office, this is a part of pain contract which you need to sign

## 2010-07-20 ENCOUNTER — Encounter: Payer: Self-pay | Admitting: Family Medicine

## 2010-07-20 LAB — CBC WITH DIFFERENTIAL/PLATELET
Eosinophils Absolute: 0.2 10*3/uL (ref 0.0–0.7)
Eosinophils Relative: 3 % (ref 0–5)
HCT: 47.1 % (ref 39.0–52.0)
Hemoglobin: 15.7 g/dL (ref 13.0–17.0)
Lymphs Abs: 2 10*3/uL (ref 0.7–4.0)
MCH: 35 pg — ABNORMAL HIGH (ref 26.0–34.0)
MCV: 104.9 fL — ABNORMAL HIGH (ref 78.0–100.0)
Monocytes Absolute: 0.9 10*3/uL (ref 0.1–1.0)
Monocytes Relative: 13 % — ABNORMAL HIGH (ref 3–12)
RBC: 4.49 MIL/uL (ref 4.22–5.81)

## 2010-07-20 LAB — BASIC METABOLIC PANEL
BUN: 12 mg/dL (ref 6–23)
CO2: 30 mEq/L (ref 19–32)
Calcium: 9.6 mg/dL (ref 8.4–10.5)
Creat: 0.93 mg/dL (ref 0.50–1.35)
Glucose, Bld: 90 mg/dL (ref 70–99)
Sodium: 143 mEq/L (ref 135–145)

## 2010-07-20 LAB — HEPATIC FUNCTION PANEL
Albumin: 4.1 g/dL (ref 3.5–5.2)
Alkaline Phosphatase: 80 U/L (ref 39–117)
Total Protein: 7.1 g/dL (ref 6.0–8.3)

## 2010-07-20 MED ORDER — HYDROCODONE-ACETAMINOPHEN 5-500 MG PO TABS: 1.0000 | ORAL_TABLET | Freq: Three times a day (TID) | ORAL | Status: DC | PRN

## 2010-07-20 MED ORDER — PREDNISONE (PAK) 5 MG PO TABS: 5.0000 mg | ORAL_TABLET | ORAL | Status: DC

## 2010-07-26 ENCOUNTER — Telehealth: Payer: Self-pay | Admitting: Family Medicine

## 2010-07-26 NOTE — Telephone Encounter (Signed)
Referrals for 2 MRI and 1 ct scan have already been entered I am uncertain what is being requested, pls follow up on this , let me know if I need to do anything more, also spk with pt pls so he can get an update on what's going on

## 2010-07-27 ENCOUNTER — Telehealth: Payer: Self-pay | Admitting: Family Medicine

## 2010-07-28 NOTE — Assessment & Plan Note (Signed)
uncontrolled pain with reported increased debility, will order MRI studies of spine

## 2010-07-28 NOTE — Telephone Encounter (Signed)
pls refer to the area where I showed you to look in char review under referrals, any probs , check with me pls(I believe this is already taken care of based on direct conversation yesterday, no need to respond unless there is a problem)

## 2010-07-28 NOTE — Assessment & Plan Note (Signed)
Counseled to quit alcohol use. 

## 2010-07-28 NOTE — Assessment & Plan Note (Signed)
Painful will refer to surgeon and for abd ct scan

## 2010-07-28 NOTE — Progress Notes (Signed)
  Subjective:    Patient ID: [name], male    DOB: 03-13-1960, 50 y.o.   MRN: [id number]  HPI Pt in for evaluation with a primary c/o mid and low back pain which ha been present for months. He reports radiation to the legs, denies numbness or weakness in the legs, denies incontinence of stool or urine, reports great limitation in abiltiy to function due to pain. He does state he still is drinking too much and wants to quit. He reports being depressed due to limitation in mobility and chronic pain. He denies suicidal or homicidal ideation, and does not hallucinate. C/o tender periumbilical swelling , wants help   Review of Systems    Denies recent fever or chills. Denies sinus pressure, nasal congestion, ear pain or sore throat. Denies chest congestion, productive cough or wheezing. Denies chest pains, palpitations, and leg swelling Denies  nausea, vomiting,diarrhea or constipation.   Denies seizure, numbness, or tingling.     Objective:   Physical Exam Patient alert and oriented and appears to be in pain. HEENT: No facial asymmetry, EOMI, no sinus tenderness, Oropharynx and moist.Poor dentition  Neck supple no adenopathy.  Chest: Clear to auscultation bilaterally.  CVS: S1, S2 no murmurs, no S3.No edema  ABD: Soft tender. Umbilical hernia  MS: decreased  ROM spine, shoulders, hips and knees.  Skin: Intact, no ulcerations or rash noted.  Psych: Good eye contact, normal affect. Memory intact not anxious or depressed appearing.  CNS: CN 2-12 intact,      Assessment & Plan:

## 2010-07-28 NOTE — Assessment & Plan Note (Signed)
New dx will start fluoxetine

## 2010-07-30 ENCOUNTER — Ambulatory Visit (HOSPITAL_COMMUNITY)
Admission: RE | Admit: 2010-07-30 | Discharge: 2010-07-30 | Disposition: A | Discharge status: Home / Self Care | Payer: Self-pay | Source: Ambulatory Visit | Attending: Family Medicine | Admitting: Family Medicine

## 2010-07-30 ENCOUNTER — Other Ambulatory Visit: Payer: Self-pay

## 2010-07-30 DIAGNOSIS — M5137 Other intervertebral disc degeneration, lumbosacral region: Secondary | ICD-10-CM | POA: Insufficient documentation

## 2010-07-30 DIAGNOSIS — M545 Low back pain, unspecified: Secondary | ICD-10-CM | POA: Insufficient documentation

## 2010-07-30 DIAGNOSIS — M51379 Other intervertebral disc degeneration, lumbosacral region without mention of lumbar back pain or lower extremity pain: Secondary | ICD-10-CM | POA: Insufficient documentation

## 2010-07-30 DIAGNOSIS — K429 Umbilical hernia without obstruction or gangrene: Secondary | ICD-10-CM

## 2010-07-30 DIAGNOSIS — R209 Unspecified disturbances of skin sensation: Secondary | ICD-10-CM | POA: Insufficient documentation

## 2010-07-30 DIAGNOSIS — M5126 Other intervertebral disc displacement, lumbar region: Secondary | ICD-10-CM | POA: Insufficient documentation

## 2010-07-30 MED ORDER — IOHEXOL 300 MG/ML  SOLN
100.0000 mL | Freq: Once | INTRAMUSCULAR | Status: AC | PRN
  Administered 2010-07-30: 100 mL via INTRAVENOUS

## 2010-10-06 ENCOUNTER — Encounter: Payer: Self-pay | Admitting: Family Medicine

## 2010-10-06 ENCOUNTER — Ambulatory Visit (INDEPENDENT_AMBULATORY_CARE_PROVIDER_SITE_OTHER): Payer: Self-pay | Admitting: Family Medicine

## 2010-10-06 VITALS — BP 170/80 | HR 108 | Ht 76.5 in | Wt 222.1 lb

## 2010-10-06 DIAGNOSIS — M5137 Other intervertebral disc degeneration, lumbosacral region: Secondary | ICD-10-CM

## 2010-10-06 DIAGNOSIS — F419 Anxiety disorder, unspecified: Secondary | ICD-10-CM

## 2010-10-06 DIAGNOSIS — F329 Major depressive disorder, single episode, unspecified: Secondary | ICD-10-CM

## 2010-10-06 DIAGNOSIS — F411 Generalized anxiety disorder: Secondary | ICD-10-CM

## 2010-10-06 DIAGNOSIS — M51379 Other intervertebral disc degeneration, lumbosacral region without mention of lumbar back pain or lower extremity pain: Secondary | ICD-10-CM

## 2010-10-06 DIAGNOSIS — F1010 Alcohol abuse, uncomplicated: Secondary | ICD-10-CM

## 2010-10-06 DIAGNOSIS — F3289 Other specified depressive episodes: Secondary | ICD-10-CM

## 2010-10-06 MED ORDER — GABAPENTIN 300 MG PO CAPS: 300.0000 mg | ORAL_CAPSULE | Freq: Three times a day (TID) | ORAL | Status: DC

## 2010-10-06 MED ORDER — METHYLPREDNISOLONE ACETATE 40 MG/ML IJ SUSP
40.0000 mg | Freq: Once | INTRAMUSCULAR | Status: AC
  Administered 2010-10-06: 40 mg via INTRAMUSCULAR

## 2010-10-06 MED ORDER — FLUOXETINE HCL 20 MG PO CAPS: 20.0000 mg | ORAL_CAPSULE | Freq: Every day | ORAL | Status: DC

## 2010-10-06 MED ORDER — KETOROLAC TROMETHAMINE 60 MG/2ML IM SOLN
60.0000 mg | Freq: Once | INTRAMUSCULAR | Status: AC
  Administered 2010-10-06: 60 mg via INTRAMUSCULAR

## 2010-10-06 NOTE — Assessment & Plan Note (Addendum)
The patient has mild disc bulge at L4-L5 he also has mild disc bulge at T5-T6 with mild impingement in the lumbar region. There is no evidence of spinal stenosis. Information was obtained from MRI performed in June. He appears to have significant pain even though his MRI does not show severe disc disease. He is currently maintained on hydrocodone. I do not get any refills as he was given a prescription with 5 refills 2 months ago. I was did advise him to use only for severe pain as he is running out of pain medication. I will add Neurontin to his regimen secondary to his radicular symptoms. He is uninsured therefore he is unable to pay to see a neurosurgeon for a second opinion or epidural injections. A steroid injection given for inflammation, Toradol given for pain

## 2010-10-06 NOTE — Assessment & Plan Note (Signed)
Start fluoxetine

## 2010-10-06 NOTE — Assessment & Plan Note (Signed)
The patient has both depression and anxiety. He was very weary to start the antidepressant initially secondary to his sister been started on Zoloft and had mood changes. I have recommended to him not to use his family members medications. He needs to be on long-term medication as he has history of alcoholism which is severe and very recent. He is also regarding on a narcotic and I did not feel comfortable giving him another habit-forming drug today.

## 2010-10-06 NOTE — Patient Instructions (Addendum)
For your nerves start the fluoxetine. Take 1 tablet daily, do not take any medications from your family members For your back pain continue the pain medication Start the new medication Neurontin for the nerve pain associated with your back- take 1 tablet at bedtime for 1 week, then increase to 1 tablet twice a day for 1 week, then increase to 1 tablet three times a day  Continue to walk as much as possible We will keep an eye out on your blood Follow-up visit in 2 weeks

## 2010-10-06 NOTE — Assessment & Plan Note (Signed)
Patient denies any recent alcohol. We'll continue to monitor, he does have significant tremor which concerns me that he may be drinking on and off

## 2010-10-06 NOTE — Progress Notes (Signed)
  Subjective:    Patient ID: [name], male    DOB: 22-May-1960, 50 y.o.   MRN: [id number]  HPI Chronic back pain- patient here to followup on his back pain. He states he is in severe pain today. He's been using the oxycodone 3 times a day per report he was given a prescription with 5 refills at his visit in June and states that he only has one refill left and a few pills and is bottle currently. He is currently under going workup to obtain disability. His pain is unchanged. It is in his lower back and radiates to both buttocks. He has noticed that he has fallen because of the pain and now he is unable to do any of his previous jobs. He was seen by a neurosurgeon in the past and given epidural injections however this is been greater than 5 years ago. He was given a steroid dose pack at the last visit which he said helped for the approximately 2 weeks. ROS- He denies any change in bowel or bladder, denies tingling numbness in feet, has numb sensation over tail bone  Anxiety- patient states he has severe anxiety. He has been taking Xanax from his family members to help calm himself. He has chronic shaking of the limbs however feels it has been worse recently. He denies any alcohol use since June 2012. He states his son is also going to jail for the next 10-20 years and that is making him anxious. He also is unable to work which also makes him anxious and depressed at times.ROS-  He denies any suicidal ideations, denies any hallucinations. He did not take the medication initially because he states his sister went crazy while taking  Hypertension- patient's chart states he has history of hypertension. He has not required any medications in the past few years. Patient states his blood pressure was elevated and his meeting with his new case manager yesterday his blood pressure is elevated today but he thinks is because he is in severe pain. I reviewed his past few blood pressures and they have all had a  systolic of 120 to 130. ROS- He denies chest pain   Review of Systems  per above     Objective:   Physical Exam GEN- NAD, alert and oriented x3, appears uncomfortable in chair, twisting and turning  HEENT- PERRL, EOMI, non injected sclera, pink conjunctiva,  Neck- Supple,  CVS- RRR, no murmur RESP-CTAB EXT- No edema Pulses- Radial, DP- 2+ Back- TTP in lumbar spine and across paraspinals, +SLR bilat, strength lower ext equal bilat   DTR - symmetric bilat (slightly hyper-reflexic), pain with extension and flexion Neuro- tremor in upper ext, no asterexis noted, tremor worse with arms extended. (pt reports chronic tremor) Pscyh- very anxious appearing, no apparent hallucinations, not depressed appearing, good eye contact, normal speech      Assessment & Plan:

## 2010-10-06 NOTE — Assessment & Plan Note (Signed)
He is elevated blood pressure on today's exam. I reviewed previous blood pressures which have all been normal. I will treat his pain per above. We'll recheck his blood pressure in 2 weeks when he follows up on his new medications

## 2010-10-22 ENCOUNTER — Encounter: Payer: Self-pay | Admitting: Family Medicine

## 2010-10-22 ENCOUNTER — Ambulatory Visit (INDEPENDENT_AMBULATORY_CARE_PROVIDER_SITE_OTHER): Payer: Self-pay | Admitting: Family Medicine

## 2010-10-22 VITALS — BP 162/98 | HR 98 | Resp 16 | Ht 74.5 in | Wt 219.8 lb

## 2010-10-22 DIAGNOSIS — F411 Generalized anxiety disorder: Secondary | ICD-10-CM

## 2010-10-22 DIAGNOSIS — I10 Essential (primary) hypertension: Secondary | ICD-10-CM

## 2010-10-22 DIAGNOSIS — M5137 Other intervertebral disc degeneration, lumbosacral region: Secondary | ICD-10-CM

## 2010-10-22 DIAGNOSIS — F419 Anxiety disorder, unspecified: Secondary | ICD-10-CM

## 2010-10-22 DIAGNOSIS — M51379 Other intervertebral disc degeneration, lumbosacral region without mention of lumbar back pain or lower extremity pain: Secondary | ICD-10-CM

## 2010-10-22 MED ORDER — LISINOPRIL-HYDROCHLOROTHIAZIDE 10-12.5 MG PO TABS: 1.0000 | ORAL_TABLET | Freq: Every day | ORAL | Status: DC

## 2010-10-22 NOTE — Progress Notes (Signed)
  Subjective:    Patient ID: [name], male    DOB: [birth year], 50 y.o.   MRN: [id number]  HPI  F/u meds    HTN-- Mother checked bP and it was running high yesterday, 170/80's, no CP, no HA , reviewed recent blood pressures. Was on BP meds in the past.   Back pain-- improved with neurontin, he has cut back to twice a day on his chronic pain meds, since this is helping, he is taking Neurontin three times a day   Anxiety- taking prozac, feels better, thinks this is also helping his tremor a lot. Would like to continue. No abnormal dreams, thoughts, no apparent SI   Review of Systems  Per above     Objective:   Physical Exam   GEN- NAD, alert and oriented    CVS- RRR, no murmur    RESP-CTAB    EXT- no edema     Neuro- tremor in upper ext bilat    Psych- anxious appearing, no apparent hallucinations, not depressed appearing       Assessment & Plan:

## 2010-10-22 NOTE — Patient Instructions (Signed)
F/U 6 weeks for blood pressure  Continue your neurontin Continue the prozac  Start the HCTZ for blood pressure

## 2010-10-24 NOTE — Assessment & Plan Note (Signed)
Continue prozac, hopefully pt will continue on medications Denies ETOH for past 4 months

## 2010-10-24 NOTE — Assessment & Plan Note (Signed)
Will start low dose ACE/HCTZ

## 2010-10-24 NOTE — Assessment & Plan Note (Signed)
Continue neurontin and hydrocodone

## 2010-10-27 ENCOUNTER — Emergency Department (HOSPITAL_COMMUNITY): Payer: Self-pay

## 2010-10-27 ENCOUNTER — Emergency Department (HOSPITAL_COMMUNITY)
Admission: EM | Admit: 2010-10-27 | Discharge: 2010-10-27 | Disposition: A | Discharge status: Home / Self Care | Payer: Self-pay | Attending: Surgery | Admitting: Surgery

## 2010-10-27 DIAGNOSIS — Z8673 Personal history of transient ischemic attack (TIA), and cerebral infarction without residual deficits: Secondary | ICD-10-CM | POA: Insufficient documentation

## 2010-10-27 DIAGNOSIS — S335XXA Sprain of ligaments of lumbar spine, initial encounter: Secondary | ICD-10-CM | POA: Insufficient documentation

## 2010-10-27 DIAGNOSIS — R079 Chest pain, unspecified: Secondary | ICD-10-CM | POA: Insufficient documentation

## 2010-10-27 DIAGNOSIS — G8929 Other chronic pain: Secondary | ICD-10-CM | POA: Insufficient documentation

## 2010-10-27 DIAGNOSIS — I252 Old myocardial infarction: Secondary | ICD-10-CM | POA: Insufficient documentation

## 2010-10-27 DIAGNOSIS — R109 Unspecified abdominal pain: Secondary | ICD-10-CM | POA: Insufficient documentation

## 2010-10-27 DIAGNOSIS — M545 Low back pain, unspecified: Secondary | ICD-10-CM | POA: Insufficient documentation

## 2010-10-27 DIAGNOSIS — T07XXXA Unspecified multiple injuries, initial encounter: Secondary | ICD-10-CM | POA: Insufficient documentation

## 2010-10-27 DIAGNOSIS — IMO0002 Reserved for concepts with insufficient information to code with codable children: Secondary | ICD-10-CM | POA: Insufficient documentation

## 2010-10-27 DIAGNOSIS — F1010 Alcohol abuse, uncomplicated: Secondary | ICD-10-CM | POA: Insufficient documentation

## 2010-10-27 LAB — DIFFERENTIAL
Eosinophils Absolute: 0 10*3/uL (ref 0.0–0.7)
Lymphocytes Relative: 24 % (ref 12–46)
Lymphs Abs: 2.2 10*3/uL (ref 0.7–4.0)
Monocytes Relative: 7 % (ref 3–12)
Neutro Abs: 6.3 10*3/uL (ref 1.7–7.7)
Neutrophils Relative %: 69 % (ref 43–77)

## 2010-10-27 LAB — CBC
Hemoglobin: 16.7 g/dL (ref 13.0–17.0)
MCH: 35.5 pg — ABNORMAL HIGH (ref 26.0–34.0)
MCV: 97.5 fL (ref 78.0–100.0)
Platelets: 173 10*3/uL (ref 150–400)
RBC: 4.71 MIL/uL (ref 4.22–5.81)
WBC: 9.2 10*3/uL (ref 4.0–10.5)

## 2010-10-27 LAB — POCT I-STAT, CHEM 8
BUN: 18 mg/dL (ref 6–23)
Chloride: 106 meq/L (ref 96–112)
HCT: 51 % (ref 39.0–52.0)
Potassium: 4.5 meq/L (ref 3.5–5.1)
Sodium: 137 meq/L (ref 135–145)

## 2010-11-04 ENCOUNTER — Telehealth: Payer: Self-pay | Admitting: Family Medicine

## 2010-11-04 DIAGNOSIS — M5137 Other intervertebral disc degeneration, lumbosacral region: Secondary | ICD-10-CM

## 2010-11-04 NOTE — Telephone Encounter (Signed)
He was given 5 refills in June 2012, by Dr. Lodema Hong. Can you please call his pharmacy and verify what he getting and if he has refills. If they ask about the neurontin, I am not changing his medication at this time, and I have sent forms back secondary to his alcoholism

## 2010-11-05 ENCOUNTER — Telehealth: Payer: Self-pay | Admitting: Family Medicine

## 2010-11-05 MED ORDER — HYDROCODONE-ACETAMINOPHEN 5-500 MG PO TABS: 1.0000 | ORAL_TABLET | Freq: Three times a day (TID) | ORAL | Status: DC | PRN

## 2010-11-05 NOTE — Telephone Encounter (Signed)
I reviewed database, I will give him 1 month refill, he must come pick it up and he must keep appt in October before any further medications

## 2010-11-05 NOTE — Telephone Encounter (Signed)
Patient aware script is available for pick up 

## 2010-11-05 NOTE — Telephone Encounter (Signed)
Patient aware.

## 2010-12-03 ENCOUNTER — Encounter: Payer: Self-pay | Admitting: Family Medicine

## 2010-12-03 ENCOUNTER — Ambulatory Visit (INDEPENDENT_AMBULATORY_CARE_PROVIDER_SITE_OTHER): Payer: Self-pay | Admitting: Family Medicine

## 2010-12-03 VITALS — BP 112/72 | HR 97 | Resp 16 | Ht 74.0 in | Wt 211.4 lb

## 2010-12-03 DIAGNOSIS — I10 Essential (primary) hypertension: Secondary | ICD-10-CM

## 2010-12-03 DIAGNOSIS — M51379 Other intervertebral disc degeneration, lumbosacral region without mention of lumbar back pain or lower extremity pain: Secondary | ICD-10-CM

## 2010-12-03 DIAGNOSIS — F419 Anxiety disorder, unspecified: Secondary | ICD-10-CM

## 2010-12-03 DIAGNOSIS — F411 Generalized anxiety disorder: Secondary | ICD-10-CM

## 2010-12-03 DIAGNOSIS — S161XXA Strain of muscle, fascia and tendon at neck level, initial encounter: Secondary | ICD-10-CM

## 2010-12-03 DIAGNOSIS — F1010 Alcohol abuse, uncomplicated: Secondary | ICD-10-CM

## 2010-12-03 DIAGNOSIS — M5137 Other intervertebral disc degeneration, lumbosacral region: Secondary | ICD-10-CM

## 2010-12-03 MED ORDER — METHYLPREDNISOLONE ACETATE 40 MG/ML IJ SUSP
40.0000 mg | Freq: Once | INTRAMUSCULAR | Status: AC
  Administered 2010-12-03: 40 mg via INTRAMUSCULAR

## 2010-12-03 MED ORDER — FLUOXETINE HCL 20 MG PO CAPS: 20.0000 mg | ORAL_CAPSULE | Freq: Every day | ORAL | Status: DC

## 2010-12-03 MED ORDER — GABAPENTIN 300 MG PO CAPS: 300.0000 mg | ORAL_CAPSULE | Freq: Three times a day (TID) | ORAL | Status: DC

## 2010-12-03 MED ORDER — HYDROCODONE-ACETAMINOPHEN 5-500 MG PO TABS: 1.0000 | ORAL_TABLET | Freq: Three times a day (TID) | ORAL | Status: DC | PRN

## 2010-12-03 MED ORDER — LISINOPRIL-HYDROCHLOROTHIAZIDE 10-12.5 MG PO TABS: 1.0000 | ORAL_TABLET | Freq: Every day | ORAL | Status: DC

## 2010-12-03 MED ORDER — KETOROLAC TROMETHAMINE 30 MG/ML IJ SOLN
30.0000 mg | Freq: Once | INTRAMUSCULAR | Status: AC
  Administered 2010-12-03: 30 mg via INTRAMUSCULAR

## 2010-12-03 MED ORDER — CYCLOBENZAPRINE HCL 10 MG PO TABS: 10.0000 mg | ORAL_TABLET | Freq: Every evening | ORAL | Status: DC | PRN

## 2010-12-03 NOTE — Patient Instructions (Addendum)
Continue your current medications Take the pain medication as prescribed Use the muscle relaxant at bedtime for the next week Try a heating pad to the neck I advise you not to drink any alcohol Get your blood work before your next visit - Do not eat after midnight Follow-up in 4 months
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
## 2010-12-05 DIAGNOSIS — S161XXA Strain of muscle, fascia and tendon at neck level, initial encounter: Secondary | ICD-10-CM | POA: Insufficient documentation

## 2010-12-05 NOTE — Assessment & Plan Note (Signed)
Hydrocodone refilled.  

## 2010-12-05 NOTE — Assessment & Plan Note (Addendum)
MSK strain, reviewed CT neck no acute pathology. Flexeril for next week. Enouraged ROM

## 2010-12-05 NOTE — Assessment & Plan Note (Signed)
Continue Prozac, pt feels this has helped a lot with his mood. He denies any connection between the recent events above and his mood.

## 2010-12-05 NOTE — Assessment & Plan Note (Signed)
Good control, continue current med
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
## 2010-12-22 ENCOUNTER — Telehealth: Payer: Self-pay | Admitting: Family Medicine

## 2010-12-24 NOTE — Telephone Encounter (Signed)
I do not see anything in her note stating he is out of work, if he has been disabled for some time from another provider, he either needs that provider to send the information, or to have that information sent here

## 2010-12-24 NOTE — Telephone Encounter (Signed)
Dr Rhona Raider patient

## 2010-12-27 NOTE — Telephone Encounter (Signed)
Called pt and he is aware of this. And states its okay he has the disc

## 2011-01-28 ENCOUNTER — Encounter (HOSPITAL_COMMUNITY): Payer: Self-pay

## 2011-01-28 ENCOUNTER — Emergency Department (HOSPITAL_COMMUNITY)
Admission: EM | Admit: 2011-01-28 | Discharge: 2011-01-28 | Disposition: A | Discharge status: Home / Self Care | Payer: Self-pay | Attending: Emergency Medicine | Admitting: Emergency Medicine

## 2011-01-28 DIAGNOSIS — M5431 Sciatica, right side: Secondary | ICD-10-CM

## 2011-01-28 DIAGNOSIS — M5430 Sciatica, unspecified side: Secondary | ICD-10-CM | POA: Insufficient documentation

## 2011-01-28 DIAGNOSIS — Z8673 Personal history of transient ischemic attack (TIA), and cerebral infarction without residual deficits: Secondary | ICD-10-CM | POA: Insufficient documentation

## 2011-01-28 DIAGNOSIS — Z7982 Long term (current) use of aspirin: Secondary | ICD-10-CM | POA: Insufficient documentation

## 2011-01-28 DIAGNOSIS — I10 Essential (primary) hypertension: Secondary | ICD-10-CM | POA: Insufficient documentation

## 2011-01-28 MED ORDER — CYCLOBENZAPRINE HCL 10 MG PO TABS: 10.0000 mg | ORAL_TABLET | Freq: Three times a day (TID) | ORAL | Status: AC | PRN

## 2011-01-28 MED ORDER — METHYLPREDNISOLONE ACETATE PF 80 MG/ML IJ SUSP
80.0000 mg | Freq: Once | INTRAMUSCULAR | Status: AC
  Administered 2011-01-28: 80 mg via INTRAMUSCULAR
  Filled 2011-01-28: qty 1

## 2011-01-28 MED ORDER — OXYCODONE-ACETAMINOPHEN 5-325 MG PO TABS: 1.0000 | ORAL_TABLET | ORAL | Status: AC | PRN

## 2011-01-28 MED ORDER — MORPHINE SULFATE 10 MG/ML IJ SOLN
10.0000 mg | Freq: Once | INTRAMUSCULAR | Status: AC
  Administered 2011-01-28: 10 mg via INTRAMUSCULAR
  Filled 2011-01-28: qty 1

## 2011-01-28 NOTE — ED Provider Notes (Signed)
History     CSN: [id number] Arrival date & time: 01/28/2011  4:36 PM   First MD Initiated Contact with Patient 01/28/11 1705      Chief Complaint  Patient presents with  . Back Pain    (Consider location/radiation/quality/duration/timing/severity/associated sxs/prior treatment) HPI Comments: Patient says that this week his right leg would get numb, forcing him to sit down. Then today he was out feeding his dogs in his right leg gave out and he fell to the ground. He has a history of lumbar disc disease, and is on pain management with Vicodin, prescribed by Dr. Jeanice Lim, his pain management specialist. He was going to spend [place] in [place] with his girlfriend, and she has his bottle of pain medicines.  There has been no recent injury.    Patient is a 50 y.o. male presenting with back pain.  Back Pain  This is a recurrent problem. The problem occurs every several days. The problem has been gradually worsening. The pain is associated with no known injury. The pain is present in the lumbar spine. The quality of the pain is described as shooting. The pain radiates to the right knee. The pain is at a severity of 8/10. The pain is severe. The symptoms are aggravated by bending and twisting. Associated symptoms include numbness, leg pain and weakness. Treatments tried: He says that he was planning to spend Christmas in [place] with his girlfriend, and that she has his pain medicines.   Risk factors: He has chronic back pain, and is on Vicodin as prescribed by his pain management physician, Dr. Jeanice Lim.    Past Medical History  Diagnosis Date  . Hypertension   . Stroke 2003    Past Surgical History  Procedure Date  . Cardiac catheterization 1996    Family History  Problem Relation Age of Onset  . Heart disease Mother   . Cancer Father   . Cancer Brother     melanoma    History  Substance Use Topics  . Smoking status: Never Smoker   . Smokeless tobacco: Not on file  .  Alcohol Use: No      Review of Systems  HENT: Negative.   Eyes: Negative.   Respiratory: Negative.   Cardiovascular: Negative.   Gastrointestinal: Negative.   Genitourinary: Negative.  Negative for difficulty urinating.  Musculoskeletal: Positive for back pain.  Neurological: Positive for weakness and numbness.  Psychiatric/Behavioral: Negative.     Allergies  Review of patient's allergies indicates no known allergies.  Home Medications   Current Outpatient Rx  Name Route Sig Dispense Refill  . ASPIRIN EC 325 MG PO TBEC Oral Take 325 mg by mouth daily.      Marland Kitchen FLUOXETINE HCL 20 MG PO CAPS Oral Take 20 mg by mouth daily. For nerves     . HYDROCODONE-ACETAMINOPHEN 5-500 MG PO TABS Oral Take 1 tablet by mouth every 8 (eight) hours as needed for pain. 45 tablet 3  . LISINOPRIL-HYDROCHLOROTHIAZIDE 10-12.5 MG PO TABS Oral Take 1 tablet by mouth daily. 30 tablet 3  . PSEUDOEPH-DOXYLAMINE-DM-APAP 60-7.07-13-998 MG/30ML PO LIQD Oral Take 15 mLs by mouth as needed. For cold symptoms     . CYCLOBENZAPRINE HCL 10 MG PO TABS Oral Take 1 tablet (10 mg total) by mouth 3 (three) times daily as needed for muscle spasms. 15 tablet 0  . OXYCODONE-ACETAMINOPHEN 5-325 MG PO TABS Oral Take 1 tablet by mouth every 4 (four) hours as needed for pain. 20 tablet 0  BP 124/90  Pulse 106  Temp(Src) 97.6 F (36.4 C) (Oral)  Resp 22  Ht 6\' 2"  (1.88 m)  Wt 212 lb (96.163 kg)  BMI 27.22 kg/m2  SpO2 100%  Physical Exam  Nursing note and vitals reviewed. Constitutional: He is oriented to person, place, and time.       Thin middle aged man in moderate distress complaining of back pain.  HENT:  Head: Normocephalic and atraumatic.  Right Ear: External ear normal.  Left Ear: External ear normal.  Mouth/Throat: Oropharynx is clear and moist.  Eyes: Conjunctivae and EOM are normal. Pupils are equal, round, and reactive to light.  Neck: Normal range of motion. Neck supple.  Cardiovascular: Normal rate,  regular rhythm and normal heart sounds.   Pulmonary/Chest: Effort normal and breath sounds normal.  Abdominal: Soft. Bowel sounds are normal. He exhibits no distension. There is no tenderness.  Musculoskeletal:       He localizes pain to the lumbar region and into the lateral aspect of his right thigh.  There is mild tenderness of the lumbar region to palpation.  There is no palpable bony deformity of the lumbar spine.  Neurological: He is alert and oriented to person, place, and time.       No sensory or motor deficit.   Skin: Skin is warm and dry.  Psychiatric: He has a normal mood and affect. His behavior is normal.    ED Course  Procedures (including critical care time)  5:42 PM Pt was seen and physical exam was performed.  Imaging was not ordered as there was no new injury.  Rx with IM Morphine and 80 mg Depomedrol IM for his acute symptoms, and Rx for Percocet and Flexeril until he gets his medications back from the girlfriend.  He should followup with Dr. Syliva Overman, who is covering for Dr. Jeanice Lim who is on maternity leave.  1. Sciatica of right side            Carleene Cooper III, MD 01/28/11 [phone]

## 2011-01-28 NOTE — ED Notes (Addendum)
Pt presents with flare up of chronic back pain. Pt states he was feeding dogs when pain started. Pt states his leg went numb and the pt fell onto back.

## 2011-02-22 ENCOUNTER — Encounter: Payer: Self-pay | Admitting: Family Medicine

## 2011-02-22 ENCOUNTER — Ambulatory Visit (INDEPENDENT_AMBULATORY_CARE_PROVIDER_SITE_OTHER): Payer: Self-pay | Admitting: Family Medicine

## 2011-02-22 VITALS — BP 100/60 | HR 99 | Resp 16 | Ht 74.0 in | Wt 213.1 lb

## 2011-02-22 DIAGNOSIS — F3289 Other specified depressive episodes: Secondary | ICD-10-CM

## 2011-02-22 DIAGNOSIS — F1010 Alcohol abuse, uncomplicated: Secondary | ICD-10-CM

## 2011-02-22 DIAGNOSIS — J40 Bronchitis, not specified as acute or chronic: Secondary | ICD-10-CM

## 2011-02-22 DIAGNOSIS — M5137 Other intervertebral disc degeneration, lumbosacral region: Secondary | ICD-10-CM

## 2011-02-22 DIAGNOSIS — F329 Major depressive disorder, single episode, unspecified: Secondary | ICD-10-CM

## 2011-02-22 DIAGNOSIS — M51379 Other intervertebral disc degeneration, lumbosacral region without mention of lumbar back pain or lower extremity pain: Secondary | ICD-10-CM

## 2011-02-22 DIAGNOSIS — I10 Essential (primary) hypertension: Secondary | ICD-10-CM

## 2011-02-22 MED ORDER — CYCLOBENZAPRINE HCL 10 MG PO TABS: 10.0000 mg | ORAL_TABLET | Freq: Two times a day (BID) | ORAL | Status: DC | PRN

## 2011-02-22 MED ORDER — DOXYCYCLINE HYCLATE 100 MG PO CAPS: 100.0000 mg | ORAL_CAPSULE | Freq: Two times a day (BID) | ORAL | Status: DC

## 2011-02-22 MED ORDER — KETOROLAC TROMETHAMINE 60 MG/2ML IM SOLN
60.0000 mg | Freq: Once | INTRAMUSCULAR | Status: AC
  Administered 2011-02-22: 60 mg via INTRAMUSCULAR

## 2011-02-22 MED ORDER — METHYLPREDNISOLONE ACETATE 40 MG/ML IJ SUSP
40.0000 mg | Freq: Once | INTRAMUSCULAR | Status: AC
  Administered 2011-02-22: 40 mg via INTRAMUSCULAR

## 2011-02-22 MED ORDER — FLUOXETINE HCL 40 MG PO CAPS: 40.0000 mg | ORAL_CAPSULE | Freq: Every day | ORAL | Status: DC

## 2011-02-22 NOTE — Patient Instructions (Signed)
I am treating you for bronchitis. Take the antibiotic one tablet in the morning one tablet in the evening for 7 days. For your back start the Flexeril one tablet twice a day as needed , continue the Vicodin.  He you have received a shot of steroids and pain medication today.  For your mood and anxiety complete the Prozac you have now then start the   new prescription for 40 mg  once a day. We will call you and a letter is ready for Social Security office. Followup in 6 weeks. We will discuss her labs at the next visit.

## 2011-02-22 NOTE — Progress Notes (Signed)
  Subjective:    Patient ID: [name], male    DOB: Mar 16, 1960, 51 y.o.   MRN: [id number]  HPI   10 days of cough and chest congestion, cough non productive, +fever, yesterday had temp to 101F, has tried tylenol, nyquil, sinus medication which has not helped very much +SOB, non smoker   Back pain- needs letter for medicaid stating his history and current medications. , continues to have low back pain with radicular symptoms, had 2 falls recently where he feels his hips buckle and his back gives out, seen in ED given short term course of narcotics as his meds were not available, asking for flexeril as this helped and stronger pain medication  HTN- tolerating BP , meds  Mood- feels he has been down more, especially since he cant get disabilty and would like intervention for his back. Feels he needs more meds, asked for ativan which he has used in the past. Denies recent alcohol  Review of Systems - per above  GEN- + fatigue,+ fever, weight loss,weakness, recent illness HEENT- denies eye drainage, change in vision, nasal discharge, CVS- denies chest pain, palpitations ABD- denies N/V, change in stools, abd pain GU- denies dysuria, hematuria, dribbling, incontinence Psych- +anxiety, no SI, +depression    Objective:   Physical Exam GEN- NAD, alert and oriented x3 HEENT- PERRL, EOMI, non injected sclera, pink conjunctiva, MMM, oropharynx clear, TM clear bilat, no rhinorrhea Neck- Supple,  Lymph- no cervical nodes CVS- RRR, no murmur MSK- TTP lumbar spine, +SLR bilat, strength lower ext equal bilat, no pain with inversion/eversion at hip RESP-course rhonchi bilat, no wheeze, normal WOB EXT- No edema Pulses- Radial, DP- 2+ TTP in lumbar spine and across paraspinals, +SLR bilat, strength lower ext equal bilat   DTR - symmetric bilat (slightly hyper-reflexic), pain with extension and flexion Neuro- resting tremor , no asterexis noted, Pscyh- not overly anxious, no apparent  hallucinations, not depressed appearing, good eye contact, normal speech         Assessment & Plan:

## 2011-02-23 DIAGNOSIS — J40 Bronchitis, not specified as acute or chronic: Secondary | ICD-10-CM | POA: Insufficient documentation

## 2011-02-23 MED ORDER — SULFAMETHOXAZOLE-TRIMETHOPRIM 800-160 MG PO TABS: 1.0000 | ORAL_TABLET | Freq: Two times a day (BID) | ORAL | Status: AC

## 2011-02-23 NOTE — Assessment & Plan Note (Addendum)
Add flexeril, pt to stay with hydrocodone, on pain contract, he would benefit from intervention Steroid injection and toradol given

## 2011-02-23 NOTE — Assessment & Plan Note (Addendum)
Course of antibiotics Steroid Injection given

## 2011-02-23 NOTE — Assessment & Plan Note (Signed)
Denies recent etoh use. He did have a relapse recently therefore I am avoiding benzos

## 2011-02-23 NOTE — Assessment & Plan Note (Signed)
Well controlled 

## 2011-02-23 NOTE — Assessment & Plan Note (Signed)
Deteriorated, will increase prozac to 40mg 

## 2011-02-27 ENCOUNTER — Encounter: Payer: Self-pay | Admitting: Family Medicine

## 2011-02-27 ENCOUNTER — Other Ambulatory Visit: Payer: Self-pay | Admitting: Family Medicine

## 2011-03-09 LAB — COMPREHENSIVE METABOLIC PANEL
ALT: 57 U/L — ABNORMAL HIGH (ref 0–53)
AST: 31 U/L (ref 0–37)
Albumin: 4.7 g/dL (ref 3.5–5.2)
BUN: 18 mg/dL (ref 6–23)
CO2: 27 mEq/L (ref 19–32)
Calcium: 9.8 mg/dL (ref 8.4–10.5)
Chloride: 98 mEq/L (ref 96–112)
Creat: 1.37 mg/dL — ABNORMAL HIGH (ref 0.50–1.35)
Potassium: 4.3 mEq/L (ref 3.5–5.3)

## 2011-03-09 LAB — LIPID PANEL
Cholesterol: 211 mg/dL — ABNORMAL HIGH (ref 0–200)
HDL: 48 mg/dL (ref >39)
Triglycerides: 105 mg/dL (ref <150)

## 2011-03-09 LAB — CBC
MCHC: 33.7 g/dL (ref 30.0–36.0)
RDW: 13.3 % (ref 11.5–15.5)
WBC: 8 10*3/uL (ref 4.0–10.5)

## 2011-03-25 ENCOUNTER — Telehealth: Payer: Self-pay | Admitting: Family Medicine

## 2011-03-25 NOTE — Telephone Encounter (Signed)
Completed as requested

## 2011-04-05 ENCOUNTER — Encounter: Payer: Self-pay | Admitting: Family Medicine

## 2011-04-05 ENCOUNTER — Ambulatory Visit (INDEPENDENT_AMBULATORY_CARE_PROVIDER_SITE_OTHER): Payer: Self-pay | Admitting: Family Medicine

## 2011-04-05 VITALS — BP 148/90 | HR 130 | Resp 18 | Ht 74.0 in | Wt 209.1 lb

## 2011-04-05 DIAGNOSIS — F419 Anxiety disorder, unspecified: Secondary | ICD-10-CM

## 2011-04-05 DIAGNOSIS — I10 Essential (primary) hypertension: Secondary | ICD-10-CM

## 2011-04-05 DIAGNOSIS — F411 Generalized anxiety disorder: Secondary | ICD-10-CM

## 2011-04-05 DIAGNOSIS — L503 Dermatographic urticaria: Secondary | ICD-10-CM

## 2011-04-05 DIAGNOSIS — E785 Hyperlipidemia, unspecified: Secondary | ICD-10-CM | POA: Insufficient documentation

## 2011-04-05 DIAGNOSIS — M5137 Other intervertebral disc degeneration, lumbosacral region: Secondary | ICD-10-CM

## 2011-04-05 DIAGNOSIS — R748 Abnormal levels of other serum enzymes: Secondary | ICD-10-CM

## 2011-04-05 DIAGNOSIS — F329 Major depressive disorder, single episode, unspecified: Secondary | ICD-10-CM

## 2011-04-05 DIAGNOSIS — F3289 Other specified depressive episodes: Secondary | ICD-10-CM

## 2011-04-05 MED ORDER — METHYLPREDNISOLONE ACETATE 40 MG/ML IJ SUSP
40.0000 mg | Freq: Once | INTRAMUSCULAR | Status: AC
  Administered 2011-04-05: 40 mg via INTRAMUSCULAR

## 2011-04-05 MED ORDER — HYDROCODONE-ACETAMINOPHEN 5-500 MG PO TABS: 1.0000 | ORAL_TABLET | Freq: Three times a day (TID) | ORAL | Status: DC | PRN

## 2011-04-05 MED ORDER — KETOROLAC TROMETHAMINE 30 MG/ML IJ SOLN
30.0000 mg | Freq: Once | INTRAMUSCULAR | Status: AC
  Administered 2011-04-05: 30 mg via INTRAMUSCULAR

## 2011-04-05 MED ORDER — ALPRAZOLAM 0.5 MG PO TABS: 0.5000 mg | ORAL_TABLET | Freq: Two times a day (BID) | ORAL | Status: DC | PRN

## 2011-04-05 MED ORDER — CYCLOBENZAPRINE HCL 10 MG PO TABS: 10.0000 mg | ORAL_TABLET | Freq: Two times a day (BID) | ORAL | Status: DC | PRN

## 2011-04-05 MED ORDER — LISINOPRIL-HYDROCHLOROTHIAZIDE 10-12.5 MG PO TABS: 1.0000 | ORAL_TABLET | Freq: Every day | ORAL | Status: DC

## 2011-04-05 NOTE — Assessment & Plan Note (Signed)
Elevated blood pressure, pt out of pain meds, continue BP med, previously well controlled, labs reviewed

## 2011-04-05 NOTE — Assessment & Plan Note (Signed)
Improvement in elevated liver enzymes. This is likely due to cessation of alcohol

## 2011-04-05 NOTE — Assessment & Plan Note (Signed)
Continue Prozac. I will add low-dose benzodiazepine at this time. He has been very compliant with his medications. He denies any recent alcohol use. With the rash which I believe is associated with his anxiety and his tremor he would benefit from anxiolytic in addition to SSRI. We discussed the risk and benefits of this medication. He is on a pain contract if there is any violation his Benzo and narcotic will be stopped

## 2011-04-05 NOTE — Progress Notes (Signed)
  Subjective:    Patient ID: [name], male    DOB: 1960-09-01, 50 y.o.   MRN: [id number]  HPI  Patient presents to followup his anxiety. At her last visit his Prozac was increased to 40 mg. He's been out of his Prozac and his pain medication for the past 3 days. He notices a big difference off the medication and previously it was helping with his symptoms. He's not sleeping very well and is very anxious. He shakes are worse than normal. He also notices a rash that comes up when he shakes her back in his stress level is high.  Rash- per above rash noticed when he has severe anxiety does not itch. He denies any pustules or drainage from the lesions. He typically gets a red marks that pop up over his skin. He denies any new medication, new soap, new lotion,   Back- due for refill on his chronic pain medication  Denies etoh use Labs reviewed  Review of Systems    GEN- denies fatigue, fever, weight loss,weakness, recent illness HEENT- denies eye drainage, change in vision, nasal discharge, CVS- denies chest pain, palpitations ABD- denies N/V, change in stools, abd pain GU- denies dysuria, hematuria, dribbling, incontinence Psych- +anxiety, no SI, +depression Skin- +rash    Objective:   Physical Exam GEN- NAD, alert and oriented x3 CVS- tachycardic, no murmur MSK- TTP lumbar spine, +SLR bilat, strength lower ext equal bilat, no pain with inversion/eversion at hip RESP-CTAB  Skin- blanching erythematous macules on upper ext and chest, when marks made with nail, lesion appears, no excoriations Neuro- resting tremor worse than normal , no asterexis noted, Pscyh- + anxious, no apparent hallucinations, not depressed appearing, good eye contact, normal speech        Assessment & Plan:

## 2011-04-05 NOTE — Assessment & Plan Note (Signed)
Pt to work on diet, with his elevated LFT would not start statin at this time

## 2011-04-05 NOTE — Assessment & Plan Note (Signed)
Continue Prozac 40mg , improved while on meds

## 2011-04-05 NOTE — Patient Instructions (Signed)
For your nerves take the prozac, start the xanax twice a day as needed Continue your blood pressure pill Continue vicodin for your back Watch the fatty foods, your cholesterol is a little high F/U in 1 month for your anxiety

## 2011-04-05 NOTE — Progress Notes (Signed)
Addended by: Abner Greenspan on: 04/05/2011 02:56 PM   Modules accepted: Orders

## 2011-04-05 NOTE — Assessment & Plan Note (Addendum)
Pain medication refilled, shots given

## 2011-04-05 NOTE — Assessment & Plan Note (Signed)
No additional meds

## 2011-05-05 ENCOUNTER — Ambulatory Visit (INDEPENDENT_AMBULATORY_CARE_PROVIDER_SITE_OTHER): Payer: Self-pay | Admitting: Family Medicine

## 2011-05-05 ENCOUNTER — Encounter: Payer: Self-pay | Admitting: Family Medicine

## 2011-05-05 VITALS — BP 120/74 | HR 74 | Resp 16 | Ht 74.0 in | Wt 215.0 lb

## 2011-05-05 DIAGNOSIS — F419 Anxiety disorder, unspecified: Secondary | ICD-10-CM

## 2011-05-05 DIAGNOSIS — F329 Major depressive disorder, single episode, unspecified: Secondary | ICD-10-CM

## 2011-05-05 DIAGNOSIS — F411 Generalized anxiety disorder: Secondary | ICD-10-CM

## 2011-05-05 DIAGNOSIS — M5137 Other intervertebral disc degeneration, lumbosacral region: Secondary | ICD-10-CM

## 2011-05-05 DIAGNOSIS — I10 Essential (primary) hypertension: Secondary | ICD-10-CM

## 2011-05-05 DIAGNOSIS — F3289 Other specified depressive episodes: Secondary | ICD-10-CM

## 2011-05-05 DIAGNOSIS — M51379 Other intervertebral disc degeneration, lumbosacral region without mention of lumbar back pain or lower extremity pain: Secondary | ICD-10-CM

## 2011-05-05 MED ORDER — ALPRAZOLAM 0.5 MG PO TABS: 0.5000 mg | ORAL_TABLET | Freq: Two times a day (BID) | ORAL | Status: DC | PRN

## 2011-05-05 MED ORDER — KETOROLAC TROMETHAMINE 60 MG/2ML IM SOLN
60.0000 mg | Freq: Once | INTRAMUSCULAR | Status: AC
  Administered 2011-05-05: 60 mg via INTRAMUSCULAR

## 2011-05-05 NOTE — Assessment & Plan Note (Signed)
Overall improved, pt looks well today

## 2011-05-05 NOTE — Assessment & Plan Note (Signed)
Bp at goal

## 2011-05-05 NOTE — Progress Notes (Signed)
  Subjective:    Patient ID: [name], male    DOB: [birth year], 51 y.o.   MRN: [id number]  HPI  HTN- taking BP meds daily, no concerns. No CP, no SOB  Anxiety- doing well with prozac and xanax, his shaking has also improved. He is sleeping better. He is still anxious going out in public but spent some time with family this weekend at a birthday party and was able to stay a few hours.  Depressed- feels like his mood is much better. Denies crying episodes, change in appetite.  Review of Systems - per above     Objective:   Physical Exam GEN- NAD alert and oriented x 3 CVS-RRR, no murmur RESP-CTAB Psych- not depressed or anxious appearing, no apparent SI Skin- no rash Neuro- moderate reduction in tremor       Assessment & Plan:

## 2011-05-05 NOTE — Assessment & Plan Note (Signed)
Improved, continue current meds 

## 2011-05-05 NOTE — Patient Instructions (Signed)
Continue your current medications I have refilled your anxiety medication F/U 4 months

## 2011-05-05 NOTE — Assessment & Plan Note (Signed)
Given toradol injection

## 2011-07-15 ENCOUNTER — Other Ambulatory Visit: Payer: Self-pay | Admitting: Family Medicine

## 2011-07-20 ENCOUNTER — Telehealth: Payer: Self-pay | Admitting: Family Medicine

## 2011-07-20 ENCOUNTER — Other Ambulatory Visit: Payer: Self-pay

## 2011-07-20 MED ORDER — CYCLOBENZAPRINE HCL 10 MG PO TABS: 10.0000 mg | ORAL_TABLET | Freq: Two times a day (BID) | ORAL | Status: DC | PRN

## 2011-07-20 NOTE — Telephone Encounter (Signed)
Med was originally refilled on 5/31.  Repeat refill sent in today.

## 2011-08-10 ENCOUNTER — Encounter (HOSPITAL_COMMUNITY): Payer: Self-pay | Admitting: Emergency Medicine

## 2011-08-10 ENCOUNTER — Emergency Department (HOSPITAL_COMMUNITY)
Admission: EM | Admit: 2011-08-10 | Discharge: 2011-08-10 | Disposition: A | Discharge status: Home / Self Care | Payer: Self-pay | Attending: Emergency Medicine | Admitting: Emergency Medicine

## 2011-08-10 DIAGNOSIS — I10 Essential (primary) hypertension: Secondary | ICD-10-CM | POA: Insufficient documentation

## 2011-08-10 DIAGNOSIS — Z8673 Personal history of transient ischemic attack (TIA), and cerebral infarction without residual deficits: Secondary | ICD-10-CM | POA: Insufficient documentation

## 2011-08-10 DIAGNOSIS — L509 Urticaria, unspecified: Secondary | ICD-10-CM | POA: Insufficient documentation

## 2011-08-10 DIAGNOSIS — E785 Hyperlipidemia, unspecified: Secondary | ICD-10-CM | POA: Insufficient documentation

## 2011-08-10 DIAGNOSIS — T7840XA Allergy, unspecified, initial encounter: Secondary | ICD-10-CM | POA: Insufficient documentation

## 2011-08-10 MED ORDER — EPINEPHRINE 0.3 MG/0.3ML IJ DEVI: 0.3000 mg | Freq: Once | INTRAMUSCULAR | Status: DC

## 2011-08-10 MED ORDER — CETIRIZINE HCL 10 MG PO TABS: 10.0000 mg | ORAL_TABLET | Freq: Every day | ORAL | Status: DC

## 2011-08-10 MED ORDER — PREDNISONE (PAK) 10 MG PO TABS: ORAL_TABLET | ORAL | Status: DC

## 2011-08-10 MED ORDER — DEXAMETHASONE SODIUM PHOSPHATE 4 MG/ML IJ SOLN
10.0000 mg | Freq: Once | INTRAMUSCULAR | Status: AC
  Administered 2011-08-10: 10 mg via INTRAMUSCULAR
  Filled 2011-08-10: qty 3

## 2011-08-10 NOTE — ED Provider Notes (Signed)
History     CSN: [id number]  Arrival date & time 08/10/11  0710   First MD Initiated Contact with Patient 08/10/11 0719      Chief Complaint  Patient presents with  . Rash    hives generalized on body    (Consider location/radiation/quality/duration/timing/severity/associated sxs/prior treatment) HPI Comments: 51 yo male with hx of HTN presents with acute onset pruritic rash. He was awakened at 5 am by severe itching to thighs and arms, noticed the hive like rash. Had a similar episode one year ago without any known triggers. Has taken 3 benadryl this morning, has improved on the arms but is worsening on his legs.   He denies any significant outdoor exposures, new medications, lotions, soaps, etc. No dyspnea, wheezing, mucosal involvement, stridor. Only known allergy is to bee stings.  Patient is a 51 y.o. male presenting with rash.  Rash     Past Medical History  Diagnosis Date  . Hypertension   . Stroke 2003  . Chronic back pain   . DDD (degenerative disc disease), lumbosacral   . Hx of cardiac cath 1996  . Hyperlipidemia   . History of alcohol abuse   . Anxiety   . Depression   . Elevated LFTs     secondary to ETOH    Past Surgical History  Procedure Date  . Cardiac catheterization 1996    Family History  Problem Relation Age of Onset  . Heart disease Mother   . Cancer Father   . Cancer Brother     melanoma    History  Substance Use Topics  . Smoking status: Never Smoker   . Smokeless tobacco: Not on file  . Alcohol Use: No      Review of Systems  Constitutional: Negative for fever and chills.  HENT: Negative for facial swelling, drooling and neck stiffness.   Eyes: Negative for pain.  Respiratory: Negative for cough and chest tightness.   Gastrointestinal: Negative for nausea and vomiting.  Musculoskeletal: Negative for arthralgias.  Skin: Positive for rash.  Neurological: Negative for dizziness.  All other systems reviewed and are  negative.    Allergies  Review of patient's allergies indicates no known allergies.  Home Medications   Current Outpatient Rx  Name Route Sig Dispense Refill  . ALPRAZOLAM 0.5 MG PO TABS Oral Take 1 tablet (0.5 mg total) by mouth 2 (two) times daily as needed for sleep or anxiety. 45 tablet 1  . ASPIRIN EC 325 MG PO TBEC Oral Take 325 mg by mouth daily.      . CYCLOBENZAPRINE HCL 10 MG PO TABS Oral Take 1 tablet (10 mg total) by mouth 2 (two) times daily as needed for muscle spasms. 45 tablet 2  . FLUOXETINE HCL 40 MG PO CAPS Oral Take 1 capsule (40 mg total) by mouth daily. For nerves 30 capsule 3    Note increase in dose  . HYDROCODONE-ACETAMINOPHEN 5-500 MG PO TABS Oral Take 1 tablet by mouth every 8 (eight) hours as needed for pain. 45 tablet 3  . LISINOPRIL-HYDROCHLOROTHIAZIDE 10-12.5 MG PO TABS Oral Take 1 tablet by mouth daily. 30 tablet 3    BP 114/90  Pulse 114  Temp 98.3 F (36.8 C) (Oral)  Resp 20  Ht 6\' 2"  (1.88 m)  Wt 228 lb (103.42 kg)  BMI 29.27 kg/m2  SpO2 100%  Physical Exam  Vitals reviewed. Constitutional: He is oriented to person, place, and time. He appears well-developed and well-nourished.  Scratching. Mildly anxious.  HENT:  Head: Normocephalic and atraumatic.  Nose: Nose normal.  Mouth/Throat: Oropharynx is clear and moist. No oropharyngeal exudate.  Eyes: EOM are normal. Pupils are equal, round, and reactive to light.  Neck: Neck supple.  Cardiovascular: Normal rate, regular rhythm and normal heart sounds.   No murmur heard. Pulmonary/Chest: Effort normal and breath sounds normal. No respiratory distress. He has no wheezes. He has no rales.  Abdominal: Soft.  Musculoskeletal: He exhibits no edema and no tenderness.  Neurological: He is alert and oriented to person, place, and time. No cranial nerve deficit. He exhibits normal muscle tone. Coordination normal.  Skin: Rash noted. There is erythema.       Erythematous, slightly raised  urticaria on R>L anterior thigh, buttocks, less on the flexural arms surfaces.  No palms, soles involvement.   Psychiatric: He has a normal mood and affect.    ED Course  Procedures (including critical care time)  Labs Reviewed - No data to display No results found.   1. Hives   2. Allergic reaction       MDM  51 yo with recurrent acute hives, unknown source of reaction. No respiratory involvement. Treat with decadron followed by a short course prednisone and zyrtec with benadryl prn. Follow up for worsening, dyspnea, etc. Given rx for epi-pen in case of recurrence or development of anaphylaxis.         Durwin Reges, MD 08/10/11 [phone]

## 2011-08-10 NOTE — ED Notes (Signed)
History of hives. No known source. Pt states generalized to body.

## 2011-08-10 NOTE — ED Notes (Signed)
Pt took three benadryl about 1.5 hours ago.

## 2011-08-10 NOTE — Discharge Instructions (Signed)
You seem to have an allergy or contact dermatitis. Treatment is steroid, benadryl and cetirizine.  You can continue the cetirizine for the next several weeks. If symptoms get worse then come back to the ED. If you ever have difficulty breathing or tongue swelling from an allergic reaction, then take the epi-pen.  Hives Hives (urticaria) are itchy, red, swollen patches on the skin. They may change size, shape, and location quickly and repeatedly. Hives that occur deeper in the skin can cause swelling of the hands, feet, and face. Hives may be an allergic reaction to something you or your child ate, touched, or put on the skin. Hives can also be a reaction to cold, heat, viral infections, medication, insect bites, or emotional stress. Often the cause is hard to find. Hives can come and go for several days to several weeks. Hives are not contagious. HOME CARE INSTRUCTIONS   If the cause of the hives is known, avoid exposure to that source.   To relieve itching and rash:   Apply cold compresses to the skin or take cool water baths. Do not take or give your child hot baths or showers because the warmth will make the itching worse.   The best medicine for hives is an antihistamine. An antihistamine will not cure hives, but it will reduce their severity. You can use an antihistamine available over the counter. This medicine may make your child sleepy. Teenagers should not drive while using this medicine.   Take or give an antihistamine every 6 hours until the hives are completely gone for 24 hours or as directed.   Your child may have other medications prescribed for itching. Give these as directed by your child's caregiver.   You or your child should wear loose fitting clothing, including undergarments. Skin irritations may make hives worse.   Follow-up as directed by your caregiver.  SEEK MEDICAL CARE IF:   You or your child still have considerable itching after taking the medication  (prescribed or purchased over the counter).   Joint swelling or pain occurs.  SEEK IMMEDIATE MEDICAL CARE IF:   You have a fever.   Swollen lips or tongue are noticed.   There is difficulty with breathing, swallowing, or tightness in the throat or chest.   Abdominal pain develops.   Your child starts acting very sick.  These may be the first signs of a life-threatening allergic reaction. THIS IS AN EMERGENCY. Call 911 for medical help. MAKE SURE YOU:   Understand these instructions.   Will watch your condition.   Will get help right away if you are not doing well or get worse.  Document Released: 01/31/2005 Document Revised: 01/20/2011 Document Reviewed: 09/21/2007 [facility] Patient Information 2012 [place], [place].

## 2011-08-10 NOTE — ED Provider Notes (Signed)
I have personally seen and examined the patient.  I have discussed the plan of care with the resident.  I have reviewed the documentation on PMH/FH/Soc. History.  I have reviewed the documentation of the resident and agree.  Pt well appearing, no angioedema noted.  Stable for d/c  Joya Gaskins, MD 08/10/11 [phone]

## 2011-09-06 ENCOUNTER — Ambulatory Visit (INDEPENDENT_AMBULATORY_CARE_PROVIDER_SITE_OTHER): Payer: Self-pay | Admitting: Family Medicine

## 2011-09-06 ENCOUNTER — Encounter: Payer: Self-pay | Admitting: Family Medicine

## 2011-09-06 VITALS — BP 148/88 | HR 95 | Resp 16 | Ht 74.0 in | Wt 230.0 lb

## 2011-09-06 DIAGNOSIS — M51379 Other intervertebral disc degeneration, lumbosacral region without mention of lumbar back pain or lower extremity pain: Secondary | ICD-10-CM

## 2011-09-06 DIAGNOSIS — I10 Essential (primary) hypertension: Secondary | ICD-10-CM

## 2011-09-06 DIAGNOSIS — E663 Overweight: Secondary | ICD-10-CM

## 2011-09-06 DIAGNOSIS — F411 Generalized anxiety disorder: Secondary | ICD-10-CM

## 2011-09-06 DIAGNOSIS — F419 Anxiety disorder, unspecified: Secondary | ICD-10-CM

## 2011-09-06 DIAGNOSIS — D229 Melanocytic nevi, unspecified: Secondary | ICD-10-CM

## 2011-09-06 DIAGNOSIS — E785 Hyperlipidemia, unspecified: Secondary | ICD-10-CM

## 2011-09-06 DIAGNOSIS — D239 Other benign neoplasm of skin, unspecified: Secondary | ICD-10-CM

## 2011-09-06 DIAGNOSIS — M5137 Other intervertebral disc degeneration, lumbosacral region: Secondary | ICD-10-CM

## 2011-09-06 MED ORDER — CETIRIZINE HCL 10 MG PO TABS: 10.0000 mg | ORAL_TABLET | Freq: Every day | ORAL | Status: DC

## 2011-09-06 MED ORDER — LISINOPRIL-HYDROCHLOROTHIAZIDE 10-12.5 MG PO TABS: 1.0000 | ORAL_TABLET | Freq: Every day | ORAL | Status: DC

## 2011-09-06 MED ORDER — ALPRAZOLAM 0.5 MG PO TABS: 0.5000 mg | ORAL_TABLET | Freq: Two times a day (BID) | ORAL | Status: DC | PRN

## 2011-09-06 MED ORDER — CYCLOBENZAPRINE HCL 10 MG PO TABS: 10.0000 mg | ORAL_TABLET | Freq: Two times a day (BID) | ORAL | Status: DC | PRN

## 2011-09-06 MED ORDER — FLUOXETINE HCL 40 MG PO CAPS: 40.0000 mg | ORAL_CAPSULE | Freq: Every day | ORAL | Status: DC

## 2011-09-06 MED ORDER — HYDROCODONE-ACETAMINOPHEN 5-500 MG PO TABS: 1.0000 | ORAL_TABLET | Freq: Four times a day (QID) | ORAL | Status: DC | PRN

## 2011-09-06 NOTE — Assessment & Plan Note (Signed)
He has gained 15 pounds intentionally he states that he feels healthier at this weight

## 2011-09-06 NOTE — Patient Instructions (Signed)
Get the labs done Next Monday- do not eat after midnight  Medications refilled F/U 4 weeks to recheck blood pressure, and take Mole off

## 2011-09-06 NOTE — Assessment & Plan Note (Signed)
Chronic pain he is maintained with narcotic medication at this time. Hydrocodone refilled

## 2011-09-06 NOTE — Assessment & Plan Note (Signed)
Deteriorated off medications. He is to continue Prozac and will restart his benzo diazepam. Urine drug screen was obtained today

## 2011-09-06 NOTE — Assessment & Plan Note (Signed)
He will return to have this removed in 4 weeks this will be sent for pathology

## 2011-09-06 NOTE — Assessment & Plan Note (Signed)
Check fasting lipid panel and in will start statin therapy he does have history of cerebrovascular event

## 2011-09-06 NOTE — Assessment & Plan Note (Signed)
Blood pressure suboptimal today he was previously well controlled I think this is due to been off of his benzodiazepine

## 2011-09-06 NOTE — Progress Notes (Signed)
  Subjective:    Patient ID: [name], male    DOB: [birth year], 51 y.o.   MRN: [id number]  HPI Patient presents for routine followup. He's been out of his pain medication as well as his Xanax for the past 2 weeks and history murmur and pain has worsened. He is still taking his blood pressure medication as prescribed. Colonoscopy is up to date the he is due for labs including lipid panel. He is concerned about a mole in his chest that has doubled in size over the past year and is turning colors, he is concerned because his brother died from melanoma He was evaluated in the emergency room a month ago secondary to urticaria. This has since resolved he did not take all the prednisone and had a few pills left over when he noticed urticaria coming back a few weeks ago he took a dose of prednisone and this rash also resolved.   Review of Systems  GEN- denies fatigue, fever, weight loss,weakness, recent illness HEENT- denies eye drainage, change in vision, nasal discharge, CVS- denies chest pain, palpitations RESP- denies SOB, cough, wheeze ABD- denies N/V, change in stools, abd pain GU- denies dysuria, hematuria, dribbling, incontinence MSK- + joint pain, muscle aches, injury Neuro- denies headache, dizziness, syncope, seizure activity      Objective:   Physical Exam GEN- NAD, alert and oriented x3 HEENT- PERRL, EOMI, non injected sclera, pink conjunctiva, MMM, oropharynx clear Neck- Supple, no thryomegaly CVS- RRR, no murmur RESP-CTAB EXT- No edema Pulses- Radial, DP- 2+ Psych-normal affect and Mood Neuro- tremor noted  Skin- atypical mole with multiples colors , slighty raised on left chest wall      Assessment & Plan:

## 2011-09-12 ENCOUNTER — Other Ambulatory Visit: Payer: Self-pay | Admitting: Family Medicine

## 2011-09-12 LAB — CBC
Hemoglobin: 17 g/dL (ref 13.0–17.0)
Platelets: 216 10*3/uL (ref 150–400)
RBC: 4.88 MIL/uL (ref 4.22–5.81)
WBC: 7.1 10*3/uL (ref 4.0–10.5)

## 2011-09-12 LAB — LIPID PANEL
Cholesterol: 238 mg/dL — ABNORMAL HIGH (ref 0–200)
HDL: 37 mg/dL — ABNORMAL LOW (ref >39)
Total CHOL/HDL Ratio: 6.4 Ratio
VLDL: 36 mg/dL (ref 0–40)

## 2011-09-12 LAB — COMPREHENSIVE METABOLIC PANEL
Albumin: 4.4 g/dL (ref 3.5–5.2)
Alkaline Phosphatase: 87 U/L (ref 39–117)
BUN: 15 mg/dL (ref 6–23)
CO2: 27 mEq/L (ref 19–32)
Glucose, Bld: 137 mg/dL — ABNORMAL HIGH (ref 70–99)
Potassium: 5 mEq/L (ref 3.5–5.3)
Sodium: 138 mEq/L (ref 135–145)
Total Bilirubin: 0.6 mg/dL (ref 0.3–1.2)
Total Protein: 7.3 g/dL (ref 6.0–8.3)

## 2011-09-14 MED ORDER — PRAVASTATIN SODIUM 20 MG PO TABS: 20.0000 mg | ORAL_TABLET | Freq: Every evening | ORAL | Status: DC

## 2011-09-14 NOTE — Addendum Note (Signed)
Addended by: Milinda Antis F on: 09/14/2011 05:42 PM   Modules accepted: Orders

## 2011-10-03 ENCOUNTER — Ambulatory Visit: Payer: Self-pay | Admitting: Family Medicine

## 2011-11-07 ENCOUNTER — Other Ambulatory Visit: Payer: Self-pay | Admitting: Family Medicine

## 2011-11-07 ENCOUNTER — Ambulatory Visit (INDEPENDENT_AMBULATORY_CARE_PROVIDER_SITE_OTHER): Payer: Self-pay | Admitting: Family Medicine

## 2011-11-07 ENCOUNTER — Encounter: Payer: Self-pay | Admitting: Family Medicine

## 2011-11-07 VITALS — BP 138/90 | HR 100 | Resp 16 | Ht 74.0 in | Wt 225.4 lb

## 2011-11-07 DIAGNOSIS — Z23 Encounter for immunization: Secondary | ICD-10-CM

## 2011-11-07 DIAGNOSIS — I10 Essential (primary) hypertension: Secondary | ICD-10-CM

## 2011-11-07 DIAGNOSIS — M5137 Other intervertebral disc degeneration, lumbosacral region: Secondary | ICD-10-CM

## 2011-11-07 DIAGNOSIS — D229 Melanocytic nevi, unspecified: Secondary | ICD-10-CM

## 2011-11-07 DIAGNOSIS — D239 Other benign neoplasm of skin, unspecified: Secondary | ICD-10-CM

## 2011-11-07 MED ORDER — KETOROLAC TROMETHAMINE 60 MG/2ML IJ SOLN
60.0000 mg | Freq: Once | INTRAMUSCULAR | Status: AC
  Administered 2011-11-07: 60 mg via INTRAMUSCULAR

## 2011-11-07 NOTE — Assessment & Plan Note (Signed)
S/p mole removal, await pathology, given care instructions

## 2011-11-07 NOTE — Progress Notes (Signed)
  Subjective:    Patient ID: [name], male    DOB: [birth year], 51 y.o.   MRN: [id number]  HPI Patient presents for mole removal. He said an abnormal mole on his left chest wall which is been changed for the past 6 months. He has been present for about 2-3 years. He is very concerned because his brother died from melanoma. The mole has increased in size and changed in color. No previous history of skin cancer He is also asking for Toradol injection for chronic back pain   Review of Systems - per above   GEN- no fever, no recent illness MSK-  +back pain     Objective:   Physical Exam Procedure- Mole Removal Procedure explained to patient questions answered benefits and risks discussed written consent obtained. Antiseptic-Betadine Anesthesia-1% lidocaine with epi  Mole removed from left Chest wall Specimen sent Minimal blood loss, silver nitrate applied for coagulation  Patient tolerated procedure well Bandage applied- triple antibiotic ointment        Assessment & Plan:

## 2011-11-07 NOTE — Assessment & Plan Note (Signed)
Shot of Toradol given On pain contract

## 2011-11-07 NOTE — Addendum Note (Signed)
Addended by: Kandis Fantasia B on: 11/07/2011 04:47 PM   Modules accepted: Orders

## 2011-11-07 NOTE — Patient Instructions (Signed)
Shot given for back We will call with results Keep area clean Bandage on for 24 hours  Triple antibiotic cream for next week Call if you have bleeding or signs of infection F/U 4  months

## 2011-11-07 NOTE — Assessment & Plan Note (Signed)
BP much improved today, no change to meds

## 2011-11-11 ENCOUNTER — Other Ambulatory Visit: Payer: Self-pay | Admitting: Family Medicine

## 2012-01-18 ENCOUNTER — Telehealth: Payer: Self-pay | Admitting: Family Medicine

## 2012-01-19 ENCOUNTER — Other Ambulatory Visit: Payer: Self-pay

## 2012-01-19 MED ORDER — HYDROCODONE-ACETAMINOPHEN 5-500 MG PO TABS: 1.0000 | ORAL_TABLET | Freq: Four times a day (QID) | ORAL | Status: DC | PRN

## 2012-01-19 NOTE — Telephone Encounter (Signed)
Med refilled.  Awaiting signature.  

## 2012-03-05 ENCOUNTER — Ambulatory Visit: Payer: Self-pay | Admitting: Family Medicine

## 2012-03-12 ENCOUNTER — Ambulatory Visit (INDEPENDENT_AMBULATORY_CARE_PROVIDER_SITE_OTHER): Payer: Self-pay | Admitting: Family Medicine

## 2012-03-12 ENCOUNTER — Encounter: Payer: Self-pay | Admitting: Family Medicine

## 2012-03-12 VITALS — BP 140/82 | HR 110 | Resp 18 | Ht 74.0 in | Wt 229.1 lb

## 2012-03-12 DIAGNOSIS — F3289 Other specified depressive episodes: Secondary | ICD-10-CM

## 2012-03-12 DIAGNOSIS — M549 Dorsalgia, unspecified: Secondary | ICD-10-CM

## 2012-03-12 DIAGNOSIS — R7309 Other abnormal glucose: Secondary | ICD-10-CM

## 2012-03-12 DIAGNOSIS — M5137 Other intervertebral disc degeneration, lumbosacral region: Secondary | ICD-10-CM

## 2012-03-12 DIAGNOSIS — Z79899 Other long term (current) drug therapy: Secondary | ICD-10-CM

## 2012-03-12 DIAGNOSIS — M51379 Other intervertebral disc degeneration, lumbosacral region without mention of lumbar back pain or lower extremity pain: Secondary | ICD-10-CM

## 2012-03-12 DIAGNOSIS — Z2911 Encounter for prophylactic immunotherapy for respiratory syncytial virus (RSV): Secondary | ICD-10-CM

## 2012-03-12 DIAGNOSIS — F411 Generalized anxiety disorder: Secondary | ICD-10-CM

## 2012-03-12 DIAGNOSIS — F419 Anxiety disorder, unspecified: Secondary | ICD-10-CM

## 2012-03-12 DIAGNOSIS — E785 Hyperlipidemia, unspecified: Secondary | ICD-10-CM

## 2012-03-12 DIAGNOSIS — I10 Essential (primary) hypertension: Secondary | ICD-10-CM

## 2012-03-12 DIAGNOSIS — F329 Major depressive disorder, single episode, unspecified: Secondary | ICD-10-CM

## 2012-03-12 DIAGNOSIS — R7302 Impaired glucose tolerance (oral): Secondary | ICD-10-CM

## 2012-03-12 MED ORDER — CYCLOBENZAPRINE HCL 10 MG PO TABS: 10.0000 mg | ORAL_TABLET | Freq: Two times a day (BID) | ORAL | Status: DC | PRN

## 2012-03-12 MED ORDER — HYDROCODONE-ACETAMINOPHEN 5-500 MG PO TABS: 1.0000 | ORAL_TABLET | Freq: Four times a day (QID) | ORAL | Status: DC | PRN

## 2012-03-12 MED ORDER — FLUOXETINE HCL 40 MG PO CAPS: 40.0000 mg | ORAL_CAPSULE | Freq: Every day | ORAL | Status: DC

## 2012-03-12 MED ORDER — ALPRAZOLAM 0.5 MG PO TABS: ORAL_TABLET | ORAL | Status: DC

## 2012-03-12 MED ORDER — HYDROCODONE-ACETAMINOPHEN 5-325 MG PO TABS: 1.0000 | ORAL_TABLET | ORAL | Status: DC | PRN

## 2012-03-12 MED ORDER — PRAVASTATIN SODIUM 20 MG PO TABS: 20.0000 mg | ORAL_TABLET | Freq: Every evening | ORAL | Status: DC

## 2012-03-12 NOTE — Patient Instructions (Addendum)
Continue current medications Restart Xanax and pain medications Urine drug screen to be done Get the labs done fasting  Your lawyer needs to send something on letter head  F/U 4 months

## 2012-03-13 DIAGNOSIS — M549 Dorsalgia, unspecified: Secondary | ICD-10-CM

## 2012-03-13 MED ORDER — KETOROLAC TROMETHAMINE 60 MG/2ML IJ SOLN
60.0000 mg | Freq: Once | INTRAMUSCULAR | Status: AC
  Administered 2012-03-13: 60 mg via INTRAMUSCULAR

## 2012-03-14 ENCOUNTER — Encounter: Payer: Self-pay | Admitting: Family Medicine

## 2012-03-14 DIAGNOSIS — R7302 Impaired glucose tolerance (oral): Secondary | ICD-10-CM | POA: Insufficient documentation

## 2012-03-14 NOTE — Assessment & Plan Note (Addendum)
UDS to be done Refilled pain meds He would benfit from neurosurgery intervention if he was insured Toradol shot given Shingles vaccine given

## 2012-03-14 NOTE — Progress Notes (Signed)
  Subjective:    Patient ID: [name], male    DOB: August 31, 1960, 52 y.o.   MRN: [id number]  HPI  Pt here to f/u HTN, no new concerns, out of nerve pills for the past 2 weeks, no pain meds for past week, still seeking disability Due for fasting labs, denies any ETOH intake, he stayed in during new years Taking prozac, feels good on the medicine, still has a tremor Meds reviewed, did not have with him  Review of Systems   GEN- denies fatigue, fever, weight loss,weakness, recent illness HEENT- denies eye drainage, change in vision, nasal discharge, CVS- denies chest pain, palpitations RESP- denies SOB, cough, wheeze ABD- denies N/V, change in stools, abd pain GU- denies dysuria, hematuria, dribbling, incontinence MSK- + joint pain, muscle aches, injury Neuro- denies headache, dizziness, syncope, seizure activity      Objective:   Physical Exam  GEN- NAD, alert and oriented x3 HEENT- PERRL, EOMI, non injected sclera, pink conjunctiva, MMM, oropharynx clear CVS- tachycardic, no murmur RESP-CTAB EXT- No edema Pulses- Radial, DP- 2+ Psych-normal affect and Mood Neuro- tremor noted - unchanged       Assessment & Plan:

## 2012-03-14 NOTE — Assessment & Plan Note (Signed)
Fairly well controlled, continue meds

## 2012-03-14 NOTE — Assessment & Plan Note (Signed)
Doing well on prozac.   

## 2012-03-14 NOTE — Assessment & Plan Note (Signed)
Continue prozac, refill xanax,

## 2012-03-14 NOTE — Assessment & Plan Note (Signed)
Check A1C again 

## 2012-03-14 NOTE — Assessment & Plan Note (Addendum)
Recheck FLP on statin drug 6 months

## 2012-06-11 ENCOUNTER — Telehealth: Payer: Self-pay | Admitting: Family Medicine

## 2012-06-11 ENCOUNTER — Other Ambulatory Visit: Payer: Self-pay

## 2012-06-11 ENCOUNTER — Other Ambulatory Visit: Payer: Self-pay | Admitting: Family Medicine

## 2012-06-11 MED ORDER — HYDROCODONE-ACETAMINOPHEN 5-325 MG PO TABS: 1.0000 | ORAL_TABLET | ORAL | Status: DC | PRN

## 2012-06-11 MED ORDER — ALPRAZOLAM 0.5 MG PO TABS: ORAL_TABLET | ORAL | Status: DC

## 2012-06-11 NOTE — Telephone Encounter (Signed)
Order put up front to be collected

## 2012-06-11 NOTE — Telephone Encounter (Signed)
Noted. rx awaiting signature

## 2012-06-12 LAB — BASIC METABOLIC PANEL
BUN: 11 mg/dL (ref 6–23)
Calcium: 9.3 mg/dL (ref 8.4–10.5)
Creat: 1.01 mg/dL (ref 0.50–1.35)
Glucose, Bld: 104 mg/dL — ABNORMAL HIGH (ref 70–99)

## 2012-06-12 LAB — CBC
HCT: 44.4 % (ref 39.0–52.0)
Hemoglobin: 15.5 g/dL (ref 13.0–17.0)
MCH: 35.2 pg — ABNORMAL HIGH (ref 26.0–34.0)
MCHC: 34.9 g/dL (ref 30.0–36.0)
MCV: 100.9 fL — ABNORMAL HIGH (ref 78.0–100.0)
RDW: 13.9 % (ref 11.5–15.5)

## 2012-06-12 LAB — LIPID PANEL
Cholesterol: 180 mg/dL (ref 0–200)
VLDL: 14 mg/dL (ref 0–40)

## 2012-06-12 LAB — HEMOGLOBIN A1C: Mean Plasma Glucose: 131 mg/dL — ABNORMAL HIGH (ref <117)

## 2012-07-16 ENCOUNTER — Ambulatory Visit: Payer: Self-pay | Admitting: Family Medicine

## 2012-07-26 ENCOUNTER — Encounter: Payer: Self-pay | Admitting: Family Medicine

## 2012-08-01 ENCOUNTER — Telehealth: Payer: Self-pay | Admitting: Family Medicine

## 2012-08-01 ENCOUNTER — Encounter: Payer: Self-pay | Admitting: Family Medicine

## 2012-08-01 NOTE — Telephone Encounter (Signed)
faxed

## 2012-08-01 NOTE — Telephone Encounter (Signed)
Letter written, please fax

## 2012-10-17 ENCOUNTER — Telehealth: Payer: Self-pay | Admitting: Family Medicine

## 2012-10-17 ENCOUNTER — Other Ambulatory Visit: Payer: Self-pay | Admitting: Family Medicine

## 2012-10-17 NOTE — Telephone Encounter (Signed)
Meds refilled, pt needs appt

## 2012-10-17 NOTE — Telephone Encounter (Signed)
Xanax 0.5 mg tab 1 BID prn #45 last rf 08/15/12

## 2012-10-19 NOTE — Telephone Encounter (Signed)
Pt needs office visit before any more refills.  

## 2012-10-22 ENCOUNTER — Telehealth: Payer: Self-pay | Admitting: Family Medicine

## 2012-10-22 NOTE — Telephone Encounter (Signed)
Called and spoke to pt about needing a ov for med refill he is to call back and make appt in couple days

## 2012-10-22 NOTE — Telephone Encounter (Signed)
Cyclobenzaprine 10 mg tab 1 BID prn muscle spasm #45 Epipen 0.3 mg inj inject 0.3 mLs into the muscle once for severe allergic reaction, difficulty breathing #2 Lisinopril/HCTZ 10-12.5 mg tab 1 QD #30 Xanax 0.5 mg tab 1 BID prn #45 last rf 08/15/12

## 2012-11-19 ENCOUNTER — Ambulatory Visit (INDEPENDENT_AMBULATORY_CARE_PROVIDER_SITE_OTHER): Payer: Medicare Other | Admitting: Family Medicine

## 2012-11-19 ENCOUNTER — Encounter: Payer: Self-pay | Admitting: Family Medicine

## 2012-11-19 VITALS — BP 160/88 | HR 98 | Temp 97.2°F | Resp 18 | Wt 216.0 lb

## 2012-11-19 DIAGNOSIS — F3289 Other specified depressive episodes: Secondary | ICD-10-CM

## 2012-11-19 DIAGNOSIS — M5137 Other intervertebral disc degeneration, lumbosacral region: Secondary | ICD-10-CM

## 2012-11-19 DIAGNOSIS — I10 Essential (primary) hypertension: Secondary | ICD-10-CM

## 2012-11-19 DIAGNOSIS — F329 Major depressive disorder, single episode, unspecified: Secondary | ICD-10-CM

## 2012-11-19 DIAGNOSIS — K439 Ventral hernia without obstruction or gangrene: Secondary | ICD-10-CM

## 2012-11-19 DIAGNOSIS — N529 Male erectile dysfunction, unspecified: Secondary | ICD-10-CM

## 2012-11-19 DIAGNOSIS — F419 Anxiety disorder, unspecified: Secondary | ICD-10-CM

## 2012-11-19 DIAGNOSIS — M549 Dorsalgia, unspecified: Secondary | ICD-10-CM

## 2012-11-19 DIAGNOSIS — Z23 Encounter for immunization: Secondary | ICD-10-CM

## 2012-11-19 DIAGNOSIS — E785 Hyperlipidemia, unspecified: Secondary | ICD-10-CM

## 2012-11-19 DIAGNOSIS — F411 Generalized anxiety disorder: Secondary | ICD-10-CM

## 2012-11-19 LAB — CBC WITH DIFFERENTIAL/PLATELET
Eosinophils Absolute: 0.1 10*3/uL (ref 0.0–0.7)
HCT: 42.7 % (ref 39.0–52.0)
Hemoglobin: 15.1 g/dL (ref 13.0–17.0)
Lymphs Abs: 1.6 10*3/uL (ref 0.7–4.0)
MCH: 36.1 pg — ABNORMAL HIGH (ref 26.0–34.0)
MCHC: 35.4 g/dL (ref 30.0–36.0)
MCV: 102.2 fL — ABNORMAL HIGH (ref 78.0–100.0)
Monocytes Absolute: 0.9 10*3/uL (ref 0.1–1.0)
Monocytes Relative: 15 % — ABNORMAL HIGH (ref 3–12)
Neutro Abs: 3.6 10*3/uL (ref 1.7–7.7)
Neutrophils Relative %: 57 % (ref 43–77)
RBC: 4.18 MIL/uL — ABNORMAL LOW (ref 4.22–5.81)

## 2012-11-19 LAB — COMPREHENSIVE METABOLIC PANEL
ALT: 58 U/L — ABNORMAL HIGH (ref 0–53)
AST: 72 U/L — ABNORMAL HIGH (ref 0–37)
Albumin: 4.2 g/dL (ref 3.5–5.2)
Alkaline Phosphatase: 141 U/L — ABNORMAL HIGH (ref 39–117)
BUN: 12 mg/dL (ref 6–23)
Calcium: 9.5 mg/dL (ref 8.4–10.5)
Chloride: 103 mEq/L (ref 96–112)
Creat: 0.87 mg/dL (ref 0.50–1.35)
Glucose, Bld: 155 mg/dL — ABNORMAL HIGH (ref 70–99)
Potassium: 3.8 mEq/L (ref 3.5–5.3)
Sodium: 142 mEq/L (ref 135–145)
Total Bilirubin: 0.6 mg/dL (ref 0.3–1.2)

## 2012-11-19 LAB — LIPID PANEL
Cholesterol: 234 mg/dL — ABNORMAL HIGH (ref 0–200)
HDL: 63 mg/dL (ref >39)
Total CHOL/HDL Ratio: 3.7 Ratio
Triglycerides: 106 mg/dL (ref <150)
VLDL: 21 mg/dL (ref 0–40)

## 2012-11-19 MED ORDER — SILDENAFIL CITRATE 100 MG PO TABS: 100.0000 mg | ORAL_TABLET | Freq: Every day | ORAL | Status: DC | PRN

## 2012-11-19 MED ORDER — EPINEPHRINE 0.3 MG/0.3ML IJ SOAJ: 0.3000 mg | Freq: Once | INTRAMUSCULAR | Status: DC

## 2012-11-19 MED ORDER — LISINOPRIL-HYDROCHLOROTHIAZIDE 10-12.5 MG PO TABS: ORAL_TABLET | ORAL | Status: DC

## 2012-11-19 MED ORDER — HYDROCODONE-ACETAMINOPHEN 5-325 MG PO TABS: 1.0000 | ORAL_TABLET | ORAL | Status: DC | PRN

## 2012-11-19 MED ORDER — KETOROLAC TROMETHAMINE 60 MG/2ML IM SOLN
60.0000 mg | Freq: Once | INTRAMUSCULAR | Status: AC
  Administered 2012-11-19: 60 mg via INTRAMUSCULAR

## 2012-11-19 MED ORDER — ALPRAZOLAM 0.5 MG PO TABS: ORAL_TABLET | ORAL | Status: DC

## 2012-11-19 MED ORDER — FLUOXETINE HCL 20 MG PO TABS: 20.0000 mg | ORAL_TABLET | Freq: Every day | ORAL | Status: DC

## 2012-11-19 MED ORDER — CYCLOBENZAPRINE HCL 10 MG PO TABS: 10.0000 mg | ORAL_TABLET | Freq: Two times a day (BID) | ORAL | Status: DC | PRN

## 2012-11-19 MED ORDER — CETIRIZINE HCL 10 MG PO TABS: 10.0000 mg | ORAL_TABLET | Freq: Every day | ORAL | Status: DC

## 2012-11-19 NOTE — Patient Instructions (Signed)
Restart medications We will call with lab results and how much cholesterol medication to take Start the prozac back at 20mg  once a day  Coupon given for the Viagra, try 1/2 tablet  Referral to general surgery for the hernia Flu F/U 2 weeks

## 2012-11-19 NOTE — Assessment & Plan Note (Signed)
Restart prozac and xanax

## 2012-11-19 NOTE — Assessment & Plan Note (Signed)
Check testosterone,given 3 tablets viagra with coupon

## 2012-11-19 NOTE — Assessment & Plan Note (Signed)
Restart BP meds, recheck 2 weeks, labs

## 2012-11-19 NOTE — Progress Notes (Signed)
  Subjective:    Patient ID: [name], male    DOB: Jun 11, 1960, 52 y.o.   MRN: [id number]  HPI  Pt here to f/u chronic medical problems. He has been out of his medicatoin for the past 3 months.He has taken a few BP pills from his mother who has a similar BP medications. He took a few xanax from his GF, but has not had any pain medication. He now has disability and is on Medicare.  He would like hernia to be looked at first as it is causing some discomfort. He denies abdominal pain but has had some indigestion with eating spicey and fried foods. ED- Past few months has had difficulty getting an erection, denies premature ejaculation. He has a steady GF and would like medication to assist. No difficulty with urinary stream.  Review of Systems  GEN- denies fatigue, fever, weight loss,weakness, recent illness HEENT- denies eye drainage, change in vision, nasal discharge, CVS- denies chest pain, palpitations RESP- denies SOB, cough, wheeze ABD- denies N/V, change in stools, abd pain GU- denies dysuria, hematuria, dribbling, incontinence MSK- +joint pain, muscle aches, injury Neuro- denies headache, dizziness, syncope, seizure activity      Objective:   Physical Exam GEN- NAD, alert and oriented x3 HEENT- PERRL, EOMI, non injected sclera, pink conjunctiva, MMM, oropharynx clear Neck- Supple,  CVS- RRR, no murmur RESP-CTAB ABD-NABS,soft, midline ventral hernia, easily reduced, NT EXT- No edema Pulses- Radial 2+ Psych- normal affect and mood, well groomed, good eye contact        Assessment & Plan:

## 2012-11-19 NOTE — Assessment & Plan Note (Addendum)
New pain contract signed Toradol given in office

## 2012-11-19 NOTE — Assessment & Plan Note (Signed)
Restart prozac 20mg 

## 2012-11-19 NOTE — Assessment & Plan Note (Signed)
Referral to general surgery

## 2012-11-20 LAB — TESTOSTERONE: Testosterone: 444 ng/dL (ref 300–890)

## 2012-11-22 ENCOUNTER — Telehealth: Payer: Self-pay | Admitting: Family Medicine

## 2012-11-22 NOTE — Telephone Encounter (Signed)
Patient called about his blood work results . He will be at home until 2:00 .

## 2012-11-22 NOTE — Telephone Encounter (Signed)
Left message with pt to return my call °

## 2012-11-27 NOTE — Telephone Encounter (Signed)
Left message with pt to return my call °

## 2012-11-30 NOTE — Telephone Encounter (Signed)
LMTRC

## 2012-12-03 ENCOUNTER — Ambulatory Visit: Payer: Medicare Other | Admitting: Family Medicine

## 2012-12-03 ENCOUNTER — Encounter: Payer: Self-pay | Admitting: Family Medicine

## 2012-12-05 NOTE — Telephone Encounter (Signed)
Mailed out letter to pt with resuslt

## 2012-12-14 ENCOUNTER — Other Ambulatory Visit: Payer: Self-pay | Admitting: Family Medicine

## 2012-12-14 NOTE — Telephone Encounter (Signed)
Medication refilled per protocol. 

## 2013-01-09 ENCOUNTER — Encounter: Payer: Self-pay | Admitting: *Deleted

## 2013-01-28 ENCOUNTER — Telehealth: Payer: Self-pay | Admitting: Family Medicine

## 2013-01-28 NOTE — Telephone Encounter (Signed)
noted 

## 2013-01-28 NOTE — Telephone Encounter (Signed)
Pt is calling today to let Dr Jeanice Lim know that he has scheduled his surgery with Dr Girtha Rm on 03-19-13 and he would like someone to cal him back and let him know that Dr Jeanice Lim got this Call back number [phone]

## 2013-01-28 NOTE — Telephone Encounter (Signed)
FYI

## 2013-08-12 ENCOUNTER — Emergency Department (HOSPITAL_COMMUNITY)
Admission: EM | Admit: 2013-08-12 | Discharge: 2013-08-12 | Disposition: A | Discharge status: Home / Self Care | Payer: Medicare Other | Attending: Emergency Medicine | Admitting: Emergency Medicine

## 2013-08-12 ENCOUNTER — Encounter (HOSPITAL_COMMUNITY): Payer: Self-pay | Admitting: Emergency Medicine

## 2013-08-12 ENCOUNTER — Emergency Department (HOSPITAL_COMMUNITY): Payer: Medicare Other

## 2013-08-12 DIAGNOSIS — M51379 Other intervertebral disc degeneration, lumbosacral region without mention of lumbar back pain or lower extremity pain: Secondary | ICD-10-CM | POA: Insufficient documentation

## 2013-08-12 DIAGNOSIS — Z79899 Other long term (current) drug therapy: Secondary | ICD-10-CM | POA: Diagnosis not present

## 2013-08-12 DIAGNOSIS — F329 Major depressive disorder, single episode, unspecified: Secondary | ICD-10-CM | POA: Diagnosis not present

## 2013-08-12 DIAGNOSIS — Y929 Unspecified place or not applicable: Secondary | ICD-10-CM | POA: Insufficient documentation

## 2013-08-12 DIAGNOSIS — S8990XA Unspecified injury of unspecified lower leg, initial encounter: Secondary | ICD-10-CM | POA: Diagnosis present

## 2013-08-12 DIAGNOSIS — Y9389 Activity, other specified: Secondary | ICD-10-CM | POA: Insufficient documentation

## 2013-08-12 DIAGNOSIS — M5137 Other intervertebral disc degeneration, lumbosacral region: Secondary | ICD-10-CM | POA: Insufficient documentation

## 2013-08-12 DIAGNOSIS — W172XXA Fall into hole, initial encounter: Secondary | ICD-10-CM | POA: Diagnosis not present

## 2013-08-12 DIAGNOSIS — I10 Essential (primary) hypertension: Secondary | ICD-10-CM | POA: Diagnosis not present

## 2013-08-12 DIAGNOSIS — Z9889 Other specified postprocedural states: Secondary | ICD-10-CM | POA: Diagnosis not present

## 2013-08-12 DIAGNOSIS — Z8781 Personal history of (healed) traumatic fracture: Secondary | ICD-10-CM | POA: Insufficient documentation

## 2013-08-12 DIAGNOSIS — Z87828 Personal history of other (healed) physical injury and trauma: Secondary | ICD-10-CM | POA: Insufficient documentation

## 2013-08-12 DIAGNOSIS — Z7982 Long term (current) use of aspirin: Secondary | ICD-10-CM | POA: Insufficient documentation

## 2013-08-12 DIAGNOSIS — X500XXA Overexertion from strenuous movement or load, initial encounter: Secondary | ICD-10-CM | POA: Diagnosis not present

## 2013-08-12 DIAGNOSIS — E785 Hyperlipidemia, unspecified: Secondary | ICD-10-CM | POA: Diagnosis not present

## 2013-08-12 DIAGNOSIS — G8929 Other chronic pain: Secondary | ICD-10-CM | POA: Diagnosis not present

## 2013-08-12 DIAGNOSIS — F3289 Other specified depressive episodes: Secondary | ICD-10-CM | POA: Insufficient documentation

## 2013-08-12 DIAGNOSIS — F411 Generalized anxiety disorder: Secondary | ICD-10-CM | POA: Diagnosis not present

## 2013-08-12 DIAGNOSIS — Z8673 Personal history of transient ischemic attack (TIA), and cerebral infarction without residual deficits: Secondary | ICD-10-CM | POA: Diagnosis not present

## 2013-08-12 DIAGNOSIS — S93409A Sprain of unspecified ligament of unspecified ankle, initial encounter: Secondary | ICD-10-CM | POA: Diagnosis not present

## 2013-08-12 DIAGNOSIS — S93402A Sprain of unspecified ligament of left ankle, initial encounter: Secondary | ICD-10-CM

## 2013-08-12 MED ORDER — NAPROXEN 500 MG PO TABS: 500.0000 mg | ORAL_TABLET | Freq: Two times a day (BID) | ORAL | Status: DC

## 2013-08-12 MED ORDER — OXYCODONE-ACETAMINOPHEN 5-325 MG PO TABS
1.0000 | ORAL_TABLET | Freq: Once | ORAL | Status: AC
  Administered 2013-08-12: 1 via ORAL
  Filled 2013-08-12: qty 1

## 2013-08-12 MED ORDER — NAPROXEN 375 MG PO TABS: 375.0000 mg | ORAL_TABLET | Freq: Two times a day (BID) | ORAL | Status: DC

## 2013-08-12 MED ORDER — OXYCODONE HCL 5 MG PO TABS: 5.0000 mg | ORAL_TABLET | ORAL | Status: DC | PRN

## 2013-08-12 NOTE — ED Notes (Addendum)
Lt foot and ankle painful and swollen.  Pt walking with crutches.  Took 2 percocets pta

## 2013-08-12 NOTE — ED Provider Notes (Signed)
History/physical exam/procedure(s) were performed by non-physician practitioner and as supervising physician I was immediately available for consultation/collaboration. I have reviewed all notes and am in agreement with care and plan.   [name], MD 08/12/13 [phone]

## 2013-08-12 NOTE — ED Notes (Signed)
Pt verbalized understanding of no driving within 4 hours of taking pain med due to med causes drowsiness , also made aware of med can cause constipation
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
## 2013-08-12 NOTE — Discharge Instructions (Signed)
Your x-ray today shows no fracture or dislocation. Wear the splint for comfort, apply ice, elevate and follow up with your doctor.

## 2013-08-16 ENCOUNTER — Other Ambulatory Visit: Payer: Self-pay | Admitting: Family Medicine

## 2013-08-17 NOTE — Telephone Encounter (Signed)
?   OK to Refill  

## 2013-08-18 NOTE — Telephone Encounter (Signed)
Needs appt, deny refill

## 2013-08-19 NOTE — Telephone Encounter (Signed)
Refill denied.   Call placed to patient to make aware. VM full.

## 2013-09-10 ENCOUNTER — Emergency Department (HOSPITAL_COMMUNITY): Payer: Medicare Other

## 2013-09-10 ENCOUNTER — Emergency Department (HOSPITAL_COMMUNITY)
Admission: EM | Admit: 2013-09-10 | Discharge: 2013-09-10 | Disposition: A | Discharge status: Home / Self Care | Payer: Medicare Other | Attending: Emergency Medicine | Admitting: Emergency Medicine

## 2013-09-10 ENCOUNTER — Encounter (HOSPITAL_COMMUNITY): Payer: Self-pay | Admitting: Emergency Medicine

## 2013-09-10 DIAGNOSIS — S0181XA Laceration without foreign body of other part of head, initial encounter: Secondary | ICD-10-CM

## 2013-09-10 DIAGNOSIS — Z7982 Long term (current) use of aspirin: Secondary | ICD-10-CM | POA: Insufficient documentation

## 2013-09-10 DIAGNOSIS — T07XXXA Unspecified multiple injuries, initial encounter: Secondary | ICD-10-CM

## 2013-09-10 DIAGNOSIS — S59919A Unspecified injury of unspecified forearm, initial encounter: Secondary | ICD-10-CM

## 2013-09-10 DIAGNOSIS — F411 Generalized anxiety disorder: Secondary | ICD-10-CM | POA: Diagnosis not present

## 2013-09-10 DIAGNOSIS — Y9389 Activity, other specified: Secondary | ICD-10-CM | POA: Insufficient documentation

## 2013-09-10 DIAGNOSIS — M5137 Other intervertebral disc degeneration, lumbosacral region: Secondary | ICD-10-CM | POA: Diagnosis not present

## 2013-09-10 DIAGNOSIS — S0990XA Unspecified injury of head, initial encounter: Secondary | ICD-10-CM | POA: Diagnosis not present

## 2013-09-10 DIAGNOSIS — E785 Hyperlipidemia, unspecified: Secondary | ICD-10-CM | POA: Insufficient documentation

## 2013-09-10 DIAGNOSIS — G8929 Other chronic pain: Secondary | ICD-10-CM | POA: Insufficient documentation

## 2013-09-10 DIAGNOSIS — S62501B Fracture of unspecified phalanx of right thumb, initial encounter for open fracture: Secondary | ICD-10-CM

## 2013-09-10 DIAGNOSIS — I10 Essential (primary) hypertension: Secondary | ICD-10-CM | POA: Diagnosis not present

## 2013-09-10 DIAGNOSIS — S6990XA Unspecified injury of unspecified wrist, hand and finger(s), initial encounter: Secondary | ICD-10-CM

## 2013-09-10 DIAGNOSIS — Z9889 Other specified postprocedural states: Secondary | ICD-10-CM | POA: Insufficient documentation

## 2013-09-10 DIAGNOSIS — S59909A Unspecified injury of unspecified elbow, initial encounter: Secondary | ICD-10-CM | POA: Insufficient documentation

## 2013-09-10 DIAGNOSIS — Z79899 Other long term (current) drug therapy: Secondary | ICD-10-CM | POA: Diagnosis not present

## 2013-09-10 DIAGNOSIS — S62609B Fracture of unspecified phalanx of unspecified finger, initial encounter for open fracture: Secondary | ICD-10-CM | POA: Insufficient documentation

## 2013-09-10 DIAGNOSIS — M51379 Other intervertebral disc degeneration, lumbosacral region without mention of lumbar back pain or lower extremity pain: Secondary | ICD-10-CM | POA: Insufficient documentation

## 2013-09-10 DIAGNOSIS — Y92410 Unspecified street and highway as the place of occurrence of the external cause: Secondary | ICD-10-CM | POA: Insufficient documentation

## 2013-09-10 DIAGNOSIS — Z8673 Personal history of transient ischemic attack (TIA), and cerebral infarction without residual deficits: Secondary | ICD-10-CM | POA: Insufficient documentation

## 2013-09-10 DIAGNOSIS — S060X9A Concussion with loss of consciousness of unspecified duration, initial encounter: Secondary | ICD-10-CM | POA: Insufficient documentation

## 2013-09-10 DIAGNOSIS — S0180XA Unspecified open wound of other part of head, initial encounter: Secondary | ICD-10-CM | POA: Insufficient documentation

## 2013-09-10 LAB — CBC WITH DIFFERENTIAL/PLATELET
Basophils Absolute: 0.1 10*3/uL (ref 0.0–0.1)
Basophils Relative: 1 % (ref 0–1)
Eosinophils Absolute: 0.1 10*3/uL (ref 0.0–0.7)
Eosinophils Relative: 1 % (ref 0–5)
HCT: 40 % (ref 39.0–52.0)
Hemoglobin: 13.9 g/dL (ref 13.0–17.0)
Lymphocytes Relative: 19 % (ref 12–46)
Lymphs Abs: 2.4 10*3/uL (ref 0.7–4.0)
MCH: 36.7 pg — ABNORMAL HIGH (ref 26.0–34.0)
MCHC: 34.8 g/dL (ref 30.0–36.0)
MCV: 105.5 fL — ABNORMAL HIGH (ref 78.0–100.0)
Monocytes Absolute: 1.2 10*3/uL — ABNORMAL HIGH (ref 0.1–1.0)
Monocytes Relative: 9 % (ref 3–12)
Neutro Abs: 9.4 10*3/uL — ABNORMAL HIGH (ref 1.7–7.7)
Neutrophils Relative %: 72 % (ref 43–77)
Platelets: 159 10*3/uL (ref 150–400)
RBC: 3.79 MIL/uL — ABNORMAL LOW (ref 4.22–5.81)
RDW: 14.9 % (ref 11.5–15.5)
WBC: 13.1 10*3/uL — ABNORMAL HIGH (ref 4.0–10.5)

## 2013-09-10 LAB — BASIC METABOLIC PANEL
Anion gap: 16 — ABNORMAL HIGH (ref 5–15)
BUN: 13 mg/dL (ref 6–23)
CO2: 21 mEq/L (ref 19–32)
Calcium: 8.8 mg/dL (ref 8.4–10.5)
Chloride: 103 mEq/L (ref 96–112)
Creatinine, Ser: 0.91 mg/dL (ref 0.50–1.35)
GFR calc Af Amer: >90 mL/min (ref >90)
GFR calc non Af Amer: >90 mL/min (ref >90)
Glucose, Bld: 163 mg/dL — ABNORMAL HIGH (ref 70–99)
Potassium: 4 mEq/L (ref 3.7–5.3)
Sodium: 140 mEq/L (ref 137–147)

## 2013-09-10 LAB — TYPE AND SCREEN
ABO/RH(D): O POS
Antibody Screen: NEGATIVE

## 2013-09-10 MED ORDER — POVIDONE-IODINE 10 % EX SOLN
CUTANEOUS | Status: AC
  Filled 2013-09-10: qty 118

## 2013-09-10 MED ORDER — KETOROLAC TROMETHAMINE 30 MG/ML IJ SOLN
15.0000 mg | Freq: Once | INTRAMUSCULAR | Status: AC
  Administered 2013-09-10: 15 mg via INTRAVENOUS
  Filled 2013-09-10: qty 1

## 2013-09-10 MED ORDER — BUPIVACAINE HCL (PF) 0.5 % IJ SOLN
10.0000 mL | Freq: Once | INTRAMUSCULAR | Status: AC
Start: 2013-09-10 — End: 2013-09-10
  Administered 2013-09-10: 10 mL
  Filled 2013-09-10: qty 30

## 2013-09-10 MED ORDER — CEPHALEXIN 500 MG PO CAPS: 500.0000 mg | ORAL_CAPSULE | Freq: Four times a day (QID) | ORAL | Status: DC

## 2013-09-10 MED ORDER — MORPHINE SULFATE 4 MG/ML IJ SOLN
8.0000 mg | Freq: Once | INTRAMUSCULAR | Status: AC
  Administered 2013-09-10: 8 mg via INTRAVENOUS
  Filled 2013-09-10: qty 2

## 2013-09-10 MED ORDER — OXYCODONE-ACETAMINOPHEN 5-325 MG PO TABS: 1.0000 | ORAL_TABLET | ORAL | Status: DC | PRN

## 2013-09-10 MED ORDER — CEFAZOLIN SODIUM 1 G IJ SOLR
1.0000 g | Freq: Once | INTRAMUSCULAR | Status: AC
  Administered 2013-09-10: 1 g via INTRAMUSCULAR
  Filled 2013-09-10: qty 10

## 2013-09-10 MED ORDER — ONDANSETRON HCL 4 MG/2ML IJ SOLN
4.0000 mg | Freq: Once | INTRAMUSCULAR | Status: AC
  Administered 2013-09-10: 4 mg via INTRAMUSCULAR
  Filled 2013-09-10: qty 2

## 2013-09-10 MED ORDER — HYDROMORPHONE HCL PF 1 MG/ML IJ SOLN
1.0000 mg | Freq: Once | INTRAMUSCULAR | Status: AC
  Administered 2013-09-10: 1 mg via INTRAVENOUS
  Filled 2013-09-10: qty 1

## 2013-09-10 MED ORDER — DIAZEPAM 5 MG/ML IJ SOLN
2.5000 mg | Freq: Once | INTRAMUSCULAR | Status: AC
  Administered 2013-09-10: 2.5 mg via INTRAVENOUS
  Filled 2013-09-10: qty 2

## 2013-09-10 MED ORDER — SODIUM CHLORIDE 0.9 % IV BOLUS (SEPSIS)
1000.0000 mL | Freq: Once | INTRAVENOUS | Status: AC
  Administered 2013-09-10: 1000 mL via INTRAVENOUS
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
## 2013-09-10 NOTE — Discharge Instructions (Signed)
Cast or Splint Care °Casts and splints support injured limbs and keep bones from moving while they heal. It is important to care for your cast or splint at home.   °HOME CARE INSTRUCTIONS °· Keep the cast or splint uncovered during the drying period. It can take 24 to 48 hours to dry if it is made of plaster. A fiberglass cast will dry in less than 1 hour. °· Do not rest the cast on anything harder than a pillow for the first 24 hours. °· Do not put weight on your injured limb or apply pressure to the cast until your health care provider gives you permission. °· Keep the cast or splint dry. Wet casts or splints can lose their shape and may not support the limb as well. A wet cast that has lost its shape can also create harmful pressure on your skin when it dries. Also, wet skin can become infected. °¨ Cover the cast or splint with a plastic bag when bathing or when out in the rain or snow. If the cast is on the trunk of the body, take sponge baths until the cast is removed. °¨ If your cast does become wet, dry it with a towel or a blow dryer on the cool setting only. °· Keep your cast or splint clean. Soiled casts may be wiped with a moistened cloth. °· Do not place any hard or soft foreign objects under your cast or splint, such as cotton, toilet paper, lotion, or powder. °· Do not try to scratch the skin under the cast with any object. The object could get stuck inside the cast. Also, scratching could lead to an infection. If itching is a problem, use a blow dryer on a cool setting to relieve discomfort. °· Do not trim or cut your cast or remove padding from inside of it. °· Exercise all joints next to the injury that are not immobilized by the cast or splint. For example, if you have a long leg cast, exercise the hip joint and toes. If you have an arm cast or splint, exercise the shoulder, elbow, thumb, and fingers. °· Elevate your injured arm or leg on 1 or 2 pillows for the first 1 to 3 days to decrease  swelling and pain. It is best if you can comfortably elevate your cast so it is higher than your heart. °SEEK MEDICAL CARE IF:  °· Your cast or splint cracks. °· Your cast or splint is too tight or too loose. °· You have unbearable itching inside the cast. °· Your cast becomes wet or develops a soft spot or area. °· You have a bad smell coming from inside your cast. °· You get an object stuck under your cast. °· Your skin around the cast becomes red or raw. °· You have new pain or worsening pain after the cast has been applied. °SEEK IMMEDIATE MEDICAL CARE IF:  °· You have fluid leaking through the cast. °· You are unable to move your fingers or toes. °· You have discolored (blue or white), cool, painful, or very swollen fingers or toes beyond the cast. °· You have tingling or numbness around the injured area. °· You have severe pain or pressure under the cast. °· You have any difficulty with your breathing or have shortness of breath. °· You have chest pain. °Document Released: 01/29/2000 Document Revised: 11/21/2012 Document Reviewed: 08/09/2012 °[employer]® Patient Information ©2015 [employer]. This information is not intended to replace advice given to you by your health care   provider. Make sure you discuss any questions you have with your health care provider.  Facial Laceration A facial laceration is a cut on the face. These injuries can be painful and cause bleeding. Some cuts may need to be closed with stitches (sutures), skin adhesive strips, or wound glue. Cuts usually heal quickly but can leave a scar. It can take 1-2 years for the scar to go away completely. HOME CARE   Only take medicines as told by your doctor.  Follow your doctor's instructions for wound care. For Stitches:  Keep the cut clean and dry.  If you have a bandage (dressing), change it at least once a day. Change the bandage if it gets wet or dirty, or as told by your doctor.  Wash the cut with soap and water 2 times a day.  Rinse the cut with water. Pat it dry with a clean towel.  Put a thin layer of medicated cream on the cut as told by your doctor.  You may shower after the first 24 hours. Do not soak the cut in water until the stitches are removed.  Have your stitches removed as told by your doctor.  Do not wear any makeup until a few days after your stitches are removed. For Skin Adhesive Strips:  Keep the cut clean and dry.  Do not get the strips wet. You may take a bath, but be careful to keep the cut dry.  If the cut gets wet, pat it dry with a clean towel.  The strips will fall off on their own. Do not remove the strips that are still stuck to the cut. For Wound Glue:  You may shower or take baths. Do not soak or scrub the cut. Do not swim. Avoid heavy sweating until the glue falls off on its own. After a shower or bath, pat the cut dry with a clean towel.  Do not put medicine or makeup on your cut until the glue falls off.  If you have a bandage, do not put tape over the glue.  Avoid lots of sunlight or tanning lamps until the glue falls off.  The glue will fall off on its own in 5-10 days. Do not pick at the glue. After Healing: Put sunscreen on the cut for the first year to reduce your scar. GET HELP RIGHT AWAY IF:   Your cut area gets red, painful, or puffy (swollen).  You see a yellowish-white fluid (pus) coming from the cut.  You have chills or a fever. MAKE SURE YOU:   Understand these instructions.  Will watch your condition.  Will get help right away if you are not doing well or get worse. Document Released: 07/20/2007 Document Revised: 11/21/2012 Document Reviewed: 09/13/2012 [facility] Patient Information 2015 [place], [place]. This information is not intended to replace advice given to you by your health care provider. Make sure you discuss any questions you have with your health care provider.

## 2013-09-10 NOTE — ED Notes (Signed)
Patient verbalizes understanding of discharge instructions, follow up care, pain management, and splint care. Patient ambulatory out of department at this time waiting for ride.

## 2013-09-10 NOTE — ED Notes (Signed)
MD at bedside. 

## 2013-09-10 NOTE — ED Notes (Signed)
Reported to me by previous RN that patient refused C-Collar placement. No C-Collar in place upon this nurses assessment.

## 2013-09-10 NOTE — ED Notes (Signed)
Patient resting in a position of comfort. A&OX4. No needs voiced at this time.

## 2014-03-02 ENCOUNTER — Emergency Department (HOSPITAL_COMMUNITY): Payer: Medicare Other

## 2014-03-02 ENCOUNTER — Emergency Department (HOSPITAL_COMMUNITY)
Admission: EM | Admit: 2014-03-02 | Discharge: 2014-03-02 | Disposition: A | Discharge status: Home / Self Care | Payer: Medicare Other | Attending: Emergency Medicine | Admitting: Emergency Medicine

## 2014-03-02 ENCOUNTER — Encounter (HOSPITAL_COMMUNITY): Payer: Self-pay | Admitting: *Deleted

## 2014-03-02 DIAGNOSIS — Z792 Long term (current) use of antibiotics: Secondary | ICD-10-CM | POA: Insufficient documentation

## 2014-03-02 DIAGNOSIS — S42402A Unspecified fracture of lower end of left humerus, initial encounter for closed fracture: Secondary | ICD-10-CM

## 2014-03-02 DIAGNOSIS — R05 Cough: Secondary | ICD-10-CM

## 2014-03-02 DIAGNOSIS — S24109A Unspecified injury at unspecified level of thoracic spinal cord, initial encounter: Secondary | ICD-10-CM | POA: Insufficient documentation

## 2014-03-02 DIAGNOSIS — R55 Syncope and collapse: Secondary | ICD-10-CM

## 2014-03-02 DIAGNOSIS — F1310 Sedative, hypnotic or anxiolytic abuse, uncomplicated: Secondary | ICD-10-CM | POA: Diagnosis not present

## 2014-03-02 DIAGNOSIS — Y92410 Unspecified street and highway as the place of occurrence of the external cause: Secondary | ICD-10-CM | POA: Diagnosis not present

## 2014-03-02 DIAGNOSIS — F1010 Alcohol abuse, uncomplicated: Secondary | ICD-10-CM

## 2014-03-02 DIAGNOSIS — G8929 Other chronic pain: Secondary | ICD-10-CM | POA: Insufficient documentation

## 2014-03-02 DIAGNOSIS — S3991XA Unspecified injury of abdomen, initial encounter: Secondary | ICD-10-CM | POA: Insufficient documentation

## 2014-03-02 DIAGNOSIS — F419 Anxiety disorder, unspecified: Secondary | ICD-10-CM | POA: Insufficient documentation

## 2014-03-02 DIAGNOSIS — M25529 Pain in unspecified elbow: Secondary | ICD-10-CM

## 2014-03-02 DIAGNOSIS — Y998 Other external cause status: Secondary | ICD-10-CM | POA: Diagnosis not present

## 2014-03-02 DIAGNOSIS — S52042A Displaced fracture of coronoid process of left ulna, initial encounter for closed fracture: Secondary | ICD-10-CM | POA: Diagnosis not present

## 2014-03-02 DIAGNOSIS — S299XXA Unspecified injury of thorax, initial encounter: Secondary | ICD-10-CM | POA: Diagnosis not present

## 2014-03-02 DIAGNOSIS — T148 Other injury of unspecified body region: Secondary | ICD-10-CM | POA: Diagnosis not present

## 2014-03-02 DIAGNOSIS — R42 Dizziness and giddiness: Secondary | ICD-10-CM | POA: Diagnosis not present

## 2014-03-02 DIAGNOSIS — S199XXA Unspecified injury of neck, initial encounter: Secondary | ICD-10-CM | POA: Diagnosis not present

## 2014-03-02 DIAGNOSIS — F329 Major depressive disorder, single episode, unspecified: Secondary | ICD-10-CM | POA: Insufficient documentation

## 2014-03-02 DIAGNOSIS — Z79899 Other long term (current) drug therapy: Secondary | ICD-10-CM | POA: Diagnosis not present

## 2014-03-02 DIAGNOSIS — Y9389 Activity, other specified: Secondary | ICD-10-CM | POA: Diagnosis not present

## 2014-03-02 DIAGNOSIS — D696 Thrombocytopenia, unspecified: Secondary | ICD-10-CM | POA: Diagnosis not present

## 2014-03-02 DIAGNOSIS — E785 Hyperlipidemia, unspecified: Secondary | ICD-10-CM | POA: Insufficient documentation

## 2014-03-02 DIAGNOSIS — Z8673 Personal history of transient ischemic attack (TIA), and cerebral infarction without residual deficits: Secondary | ICD-10-CM | POA: Insufficient documentation

## 2014-03-02 DIAGNOSIS — S0990XA Unspecified injury of head, initial encounter: Secondary | ICD-10-CM | POA: Diagnosis not present

## 2014-03-02 DIAGNOSIS — S3992XA Unspecified injury of lower back, initial encounter: Secondary | ICD-10-CM | POA: Diagnosis not present

## 2014-03-02 DIAGNOSIS — Z9889 Other specified postprocedural states: Secondary | ICD-10-CM | POA: Diagnosis not present

## 2014-03-02 DIAGNOSIS — T07XXXA Unspecified multiple injuries, initial encounter: Secondary | ICD-10-CM

## 2014-03-02 DIAGNOSIS — I10 Essential (primary) hypertension: Secondary | ICD-10-CM | POA: Diagnosis not present

## 2014-03-02 DIAGNOSIS — R059 Cough, unspecified: Secondary | ICD-10-CM

## 2014-03-02 LAB — CBC WITH DIFFERENTIAL/PLATELET
BASOS ABS: 0.1 10*3/uL (ref 0.0–0.1)
Basophils Relative: 1 % (ref 0–1)
EOS PCT: 2 % (ref 0–5)
Eosinophils Absolute: 0.2 10*3/uL (ref 0.0–0.7)
HCT: 36.5 % — ABNORMAL LOW (ref 39.0–52.0)
HEMOGLOBIN: 12.1 g/dL — AB (ref 13.0–17.0)
LYMPHS PCT: 21 % (ref 12–46)
Lymphs Abs: 1.9 10*3/uL (ref 0.7–4.0)
MCH: 36.4 pg — ABNORMAL HIGH (ref 26.0–34.0)
MCHC: 33.2 g/dL (ref 30.0–36.0)
MCV: 109.9 fL — AB (ref 78.0–100.0)
Monocytes Absolute: 0.6 10*3/uL (ref 0.1–1.0)
Monocytes Relative: 7 % (ref 3–12)
Neutro Abs: 6.5 10*3/uL (ref 1.7–7.7)
Neutrophils Relative %: 70 % (ref 43–77)
Platelets: 78 10*3/uL — ABNORMAL LOW (ref 150–400)
RBC: 3.32 MIL/uL — ABNORMAL LOW (ref 4.22–5.81)
RDW: 15.4 % (ref 11.5–15.5)
WBC: 9.2 10*3/uL (ref 4.0–10.5)

## 2014-03-02 LAB — URINALYSIS, ROUTINE W REFLEX MICROSCOPIC
Bilirubin Urine: NEGATIVE
GLUCOSE, UA: NEGATIVE mg/dL
HGB URINE DIPSTICK: NEGATIVE
KETONES UR: NEGATIVE mg/dL
LEUKOCYTES UA: NEGATIVE
Nitrite: NEGATIVE
Protein, ur: NEGATIVE mg/dL
Urobilinogen, UA: 0.2 mg/dL (ref 0.0–1.0)
pH: 6 (ref 5.0–8.0)

## 2014-03-02 LAB — COMPREHENSIVE METABOLIC PANEL
ALT: 34 U/L (ref 0–53)
AST: 122 U/L — ABNORMAL HIGH (ref 0–37)
Albumin: 3.4 g/dL — ABNORMAL LOW (ref 3.5–5.2)
Alkaline Phosphatase: 175 U/L — ABNORMAL HIGH (ref 39–117)
Anion gap: 8 (ref 5–15)
BUN: 15 mg/dL (ref 6–23)
CO2: 24 mmol/L (ref 19–32)
Calcium: 8.6 mg/dL (ref 8.4–10.5)
Chloride: 105 mEq/L (ref 96–112)
Creatinine, Ser: 0.87 mg/dL (ref 0.50–1.35)
GFR calc Af Amer: >90 mL/min (ref >90)
GFR calc non Af Amer: >90 mL/min (ref >90)
Glucose, Bld: 144 mg/dL — ABNORMAL HIGH (ref 70–99)
Potassium: 4.1 mmol/L (ref 3.5–5.1)
Sodium: 137 mmol/L (ref 135–145)
Total Bilirubin: 2.1 mg/dL — ABNORMAL HIGH (ref 0.3–1.2)
Total Protein: 7.8 g/dL (ref 6.0–8.3)

## 2014-03-02 LAB — RAPID URINE DRUG SCREEN, HOSP PERFORMED
AMPHETAMINES: NOT DETECTED
BENZODIAZEPINES: POSITIVE — AB
Barbiturates: NOT DETECTED
Cocaine: NOT DETECTED
Opiates: NOT DETECTED
Tetrahydrocannabinol: NOT DETECTED

## 2014-03-02 LAB — PROTIME-INR
INR: 1.29 (ref 0.00–1.49)
Prothrombin Time: 16.3 seconds — ABNORMAL HIGH (ref 11.6–15.2)

## 2014-03-02 LAB — ETHANOL: Alcohol, Ethyl (B): 275 mg/dL — ABNORMAL HIGH (ref 0–9)

## 2014-03-02 LAB — I-STAT TROPONIN, ED: Troponin i, poc: 0 ng/mL (ref 0.00–0.08)

## 2014-03-02 MED ORDER — SODIUM CHLORIDE 0.9 % IV BOLUS (SEPSIS)
500.0000 mL | Freq: Once | INTRAVENOUS | Status: AC
  Administered 2014-03-02: 500 mL via INTRAVENOUS

## 2014-03-02 MED ORDER — OXYCODONE HCL 5 MG PO TABS: 5.0000 mg | ORAL_TABLET | Freq: Four times a day (QID) | ORAL | Status: DC | PRN

## 2014-03-02 MED ORDER — TETANUS-DIPHTH-ACELL PERTUSSIS 5-2.5-18.5 LF-MCG/0.5 IM SUSP
0.5000 mL | Freq: Once | INTRAMUSCULAR | Status: AC
  Administered 2014-03-02: 0.5 mL via INTRAMUSCULAR
  Filled 2014-03-02: qty 0.5
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
## 2014-03-02 NOTE — ED Notes (Signed)
Pt has bruising to left side and under arm

## 2014-03-19 ENCOUNTER — Ambulatory Visit: Payer: Medicare Other | Attending: Orthopaedic Surgery | Admitting: Physical Therapy

## 2014-03-19 DIAGNOSIS — I10 Essential (primary) hypertension: Secondary | ICD-10-CM | POA: Diagnosis not present

## 2014-03-19 DIAGNOSIS — W109XXD Fall (on) (from) unspecified stairs and steps, subsequent encounter: Secondary | ICD-10-CM | POA: Insufficient documentation

## 2014-03-19 DIAGNOSIS — S52045D Nondisplaced fracture of coronoid process of left ulna, subsequent encounter for closed fracture with routine healing: Secondary | ICD-10-CM | POA: Diagnosis not present

## 2014-03-19 DIAGNOSIS — M25522 Pain in left elbow: Secondary | ICD-10-CM | POA: Diagnosis present

## 2014-03-19 DIAGNOSIS — R200 Anesthesia of skin: Secondary | ICD-10-CM | POA: Insufficient documentation

## 2014-03-24 ENCOUNTER — Ambulatory Visit: Payer: Medicare Other | Admitting: *Deleted

## 2014-03-24 DIAGNOSIS — S52045D Nondisplaced fracture of coronoid process of left ulna, subsequent encounter for closed fracture with routine healing: Secondary | ICD-10-CM | POA: Diagnosis not present

## 2014-03-26 ENCOUNTER — Ambulatory Visit: Payer: Medicare Other | Admitting: *Deleted

## 2014-03-26 DIAGNOSIS — S52045D Nondisplaced fracture of coronoid process of left ulna, subsequent encounter for closed fracture with routine healing: Secondary | ICD-10-CM | POA: Diagnosis not present

## 2014-04-07 ENCOUNTER — Other Ambulatory Visit: Payer: Self-pay | Admitting: Family Medicine

## 2014-04-07 ENCOUNTER — Ambulatory Visit (INDEPENDENT_AMBULATORY_CARE_PROVIDER_SITE_OTHER): Payer: Medicare Other | Admitting: Family Medicine

## 2014-04-07 ENCOUNTER — Encounter: Payer: Self-pay | Admitting: Family Medicine

## 2014-04-07 VITALS — BP 158/92 | HR 86 | Temp 98.6°F | Resp 16 | Ht 74.0 in | Wt 210.0 lb

## 2014-04-07 DIAGNOSIS — R040 Epistaxis: Secondary | ICD-10-CM

## 2014-04-07 DIAGNOSIS — F1010 Alcohol abuse, uncomplicated: Secondary | ICD-10-CM

## 2014-04-07 DIAGNOSIS — K759 Inflammatory liver disease, unspecified: Secondary | ICD-10-CM | POA: Diagnosis not present

## 2014-04-07 DIAGNOSIS — R7302 Impaired glucose tolerance (oral): Secondary | ICD-10-CM

## 2014-04-07 DIAGNOSIS — S42402D Unspecified fracture of lower end of left humerus, subsequent encounter for fracture with routine healing: Secondary | ICD-10-CM

## 2014-04-07 DIAGNOSIS — Z1211 Encounter for screening for malignant neoplasm of colon: Secondary | ICD-10-CM | POA: Diagnosis not present

## 2014-04-07 DIAGNOSIS — E785 Hyperlipidemia, unspecified: Secondary | ICD-10-CM

## 2014-04-07 DIAGNOSIS — I10 Essential (primary) hypertension: Secondary | ICD-10-CM

## 2014-04-07 DIAGNOSIS — F419 Anxiety disorder, unspecified: Secondary | ICD-10-CM

## 2014-04-07 DIAGNOSIS — R945 Abnormal results of liver function studies: Principal | ICD-10-CM

## 2014-04-07 DIAGNOSIS — R7989 Other specified abnormal findings of blood chemistry: Secondary | ICD-10-CM

## 2014-04-07 DIAGNOSIS — S42402A Unspecified fracture of lower end of left humerus, initial encounter for closed fracture: Secondary | ICD-10-CM | POA: Insufficient documentation

## 2014-04-07 MED ORDER — FLUOXETINE HCL 20 MG PO CAPS: 20.0000 mg | ORAL_CAPSULE | Freq: Every day | ORAL | Status: DC

## 2014-04-07 MED ORDER — OXYCODONE HCL 5 MG PO TABS: 5.0000 mg | ORAL_TABLET | Freq: Four times a day (QID) | ORAL | Status: DC | PRN

## 2014-04-07 MED ORDER — LISINOPRIL-HYDROCHLOROTHIAZIDE 10-12.5 MG PO TABS: 1.0000 | ORAL_TABLET | Freq: Every day | ORAL | Status: DC
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
## 2014-04-07 NOTE — Assessment & Plan Note (Signed)
I think this is due to alcoholic hepatitis. However he did have hyperlipidemia in the past. We will need to do an ultrasound of his liver to get some imaging. Of note he also requests a referral to GI to have colonoscopy

## 2014-04-07 NOTE — Patient Instructions (Signed)
Restart your blood pressure medication Restart the prozac for your anxiety Pain medication refilled, you need to get further refills from Dr. Luna Glasgow REferral to GI for colonoscopy We will call with lab results F/U 4 weeks for blood pressure

## 2014-04-07 NOTE — Assessment & Plan Note (Signed)
Blood pressure uncontrolled restart lisinopril HCTZ

## 2014-04-07 NOTE — Assessment & Plan Note (Signed)
He states that he does not drink every day and typically only on the weekends he last drank on Saturday states that he had beer and then he had some mixed drinks I do not think that he is telling the truth regarding how much he is drinking

## 2014-04-07 NOTE — Assessment & Plan Note (Signed)
Restart Prozac 20 mg once a day. I will not given any benzodiazepines at this time

## 2014-04-07 NOTE — Assessment & Plan Note (Signed)
Unclear what the cause of this is maybe related to his blood pressure may be due to sinus congestion or could be due to his liver disease

## 2014-04-07 NOTE — Assessment & Plan Note (Signed)
I did review the database. He was given 120 tablets of Percocet by Dr. Luna Glasgow. I've given him 30 tablets of oxycodone without the acetaminophen secondary to his liver disease. He is advised that we will need to get further pain medicine from his orthopedist

## 2014-04-08 ENCOUNTER — Other Ambulatory Visit: Payer: Self-pay | Admitting: *Deleted

## 2014-04-08 LAB — CBC WITH DIFFERENTIAL/PLATELET
BASOS ABS: 0.1 10*3/uL (ref 0.0–0.1)
Basophils Relative: 1 % (ref 0–1)
EOS PCT: 1 % (ref 0–5)
Eosinophils Absolute: 0.1 10*3/uL (ref 0.0–0.7)
HCT: 38.4 % — ABNORMAL LOW (ref 39.0–52.0)
Hemoglobin: 12.6 g/dL — ABNORMAL LOW (ref 13.0–17.0)
LYMPHS ABS: 1.4 10*3/uL (ref 0.7–4.0)
LYMPHS PCT: 18 % (ref 12–46)
MCH: 35 pg — AB (ref 26.0–34.0)
MCHC: 32.8 g/dL (ref 30.0–36.0)
MCV: 106.7 fL — AB (ref 78.0–100.0)
MONO ABS: 1 10*3/uL (ref 0.1–1.0)
MONOS PCT: 12 % (ref 3–12)
MPV: 11.2 fL (ref 8.6–12.4)
Neutro Abs: 5.4 10*3/uL (ref 1.7–7.7)
Neutrophils Relative %: 68 % (ref 43–77)
PLATELETS: 116 10*3/uL — AB (ref 150–400)
RBC: 3.6 MIL/uL — AB (ref 4.22–5.81)
RDW: 15.4 % (ref 11.5–15.5)
WBC: 8 10*3/uL (ref 4.0–10.5)

## 2014-04-08 LAB — COMPREHENSIVE METABOLIC PANEL
ALBUMIN: 3.2 g/dL — AB (ref 3.5–5.2)
ALK PHOS: 178 U/L — AB (ref 39–117)
ALT: 30 U/L (ref 0–53)
AST: 90 U/L — ABNORMAL HIGH (ref 0–37)
BUN: 8 mg/dL (ref 6–23)
CO2: 26 meq/L (ref 19–32)
Calcium: 8.9 mg/dL (ref 8.4–10.5)
Chloride: 102 mEq/L (ref 96–112)
Creat: 0.68 mg/dL (ref 0.50–1.35)
GLUCOSE: 134 mg/dL — AB (ref 70–99)
POTASSIUM: 4.1 meq/L (ref 3.5–5.3)
SODIUM: 139 meq/L (ref 135–145)
TOTAL PROTEIN: 7.4 g/dL (ref 6.0–8.3)
Total Bilirubin: 1 mg/dL (ref 0.2–1.2)

## 2014-04-08 LAB — ETHANOL: Alcohol, Ethyl (B): 181 mg/dL — ABNORMAL HIGH (ref 0–10)

## 2014-04-08 LAB — HEMOGLOBIN A1C
Hgb A1c MFr Bld: 6 % — ABNORMAL HIGH (ref <5.7)
MEAN PLASMA GLUCOSE: 126 mg/dL — AB (ref <117)

## 2014-04-08 MED ORDER — CYCLOBENZAPRINE HCL 10 MG PO TABS: 10.0000 mg | ORAL_TABLET | Freq: Two times a day (BID) | ORAL | Status: DC | PRN

## 2014-04-08 MED ORDER — EPINEPHRINE 0.3 MG/0.3ML IJ SOAJ
0.3000 mg | Freq: Once | INTRAMUSCULAR | Status: DC
Start: 2014-04-08 — End: 2015-06-11

## 2014-04-08 NOTE — Telephone Encounter (Signed)
Received fax requesting refill on Flexeril and Epi-Pen.   Refill appropriate and filled per protocol.

## 2014-04-09 ENCOUNTER — Encounter (INDEPENDENT_AMBULATORY_CARE_PROVIDER_SITE_OTHER): Payer: Self-pay | Admitting: *Deleted

## 2014-04-09 LAB — HEPATITIS PANEL, ACUTE
HCV Ab: NEGATIVE
HEP B C IGM: NONREACTIVE
Hep A IgM: NONREACTIVE
Hepatitis B Surface Ag: NEGATIVE

## 2014-04-10 ENCOUNTER — Other Ambulatory Visit: Payer: Self-pay | Admitting: *Deleted

## 2014-04-10 DIAGNOSIS — K759 Inflammatory liver disease, unspecified: Secondary | ICD-10-CM

## 2014-04-16 ENCOUNTER — Ambulatory Visit (HOSPITAL_COMMUNITY)
Admission: RE | Admit: 2014-04-16 | Discharge: 2014-04-16 | Disposition: A | Discharge status: Home / Self Care | Payer: Medicare Other | Source: Ambulatory Visit | Attending: Family Medicine | Admitting: Family Medicine

## 2014-04-16 DIAGNOSIS — K759 Inflammatory liver disease, unspecified: Secondary | ICD-10-CM | POA: Insufficient documentation

## 2014-04-25 ENCOUNTER — Ambulatory Visit (INDEPENDENT_AMBULATORY_CARE_PROVIDER_SITE_OTHER): Payer: Medicare Other | Admitting: Internal Medicine

## 2014-04-28 ENCOUNTER — Ambulatory Visit (INDEPENDENT_AMBULATORY_CARE_PROVIDER_SITE_OTHER): Payer: Medicare Other | Admitting: Internal Medicine

## 2014-05-05 ENCOUNTER — Ambulatory Visit (INDEPENDENT_AMBULATORY_CARE_PROVIDER_SITE_OTHER): Payer: Medicare Other | Admitting: Family Medicine

## 2014-05-05 ENCOUNTER — Encounter: Payer: Self-pay | Admitting: Family Medicine

## 2014-05-05 VITALS — BP 138/78 | HR 82 | Temp 98.5°F | Resp 16 | Ht 74.0 in | Wt 212.0 lb

## 2014-05-05 DIAGNOSIS — R945 Abnormal results of liver function studies: Secondary | ICD-10-CM

## 2014-05-05 DIAGNOSIS — R251 Tremor, unspecified: Secondary | ICD-10-CM | POA: Diagnosis not present

## 2014-05-05 DIAGNOSIS — F419 Anxiety disorder, unspecified: Secondary | ICD-10-CM

## 2014-05-05 DIAGNOSIS — E785 Hyperlipidemia, unspecified: Secondary | ICD-10-CM | POA: Diagnosis not present

## 2014-05-05 DIAGNOSIS — F1010 Alcohol abuse, uncomplicated: Secondary | ICD-10-CM

## 2014-05-05 DIAGNOSIS — I10 Essential (primary) hypertension: Secondary | ICD-10-CM | POA: Diagnosis not present

## 2014-05-05 DIAGNOSIS — R7989 Other specified abnormal findings of blood chemistry: Secondary | ICD-10-CM | POA: Diagnosis not present

## 2014-05-05 MED ORDER — PROPRANOLOL HCL 10 MG PO TABS
10.0000 mg | ORAL_TABLET | Freq: Two times a day (BID) | ORAL | Status: DC
Start: 2014-05-05 — End: 2014-08-08

## 2014-05-05 NOTE — Progress Notes (Signed)
Patient ID: [name], male   DOB: 06/12/1960, 54 y.o.   MRN: [id number]   Subjective:    Patient ID: [name], male    DOB: Jul 08, 1960, 54 y.o.   MRN: [id number]  Patient presents for 4 week F/U  Pt here for intermin follow up on meds, last visit restarted BP meds and prozac. I declines restarting xanax, as his UDS had benzo's he has been taking from his GF also had high ETOH level which was repeat and still elevated. LFT also elevated, ultrasound showed fatty liver vs other hepatocellular disease. Has appt with GI on 4/13, has appt with neurology for persistant numbness in his left hand/arm  Tomorrow.    He does not his orthopedics declined giving him further oxycodone.     Review Of Systems:  GEN- denies fatigue, fever, weight loss,weakness, recent illness HEENT- denies eye drainage, change in vision, nasal discharge, CVS- denies chest pain, palpitations RESP- denies SOB, cough, wheeze ABD- denies N/V, change in stools, abd pain GU- denies dysuria, hematuria, dribbling, incontinence MSK- + joint pain, muscle aches, injury Neuro- denies headache, dizziness, syncope, seizure activity       Objective:    BP 138/78 mmHg  Pulse 82  Temp(Src) 98.5 F (36.9 C) (Oral)  Resp 16  Ht 6\' 2"  (1.88 m)  Wt 212 lb (96.163 kg)  BMI 27.21 kg/m2 GEN- NAD, alert and oriented x3, very strong [place] odor HEENT- PERRL, EOMI, non injected sclera, pink conjunctiva, MMM, oropharynx clear CVS- tachycardic HR 98, no murmur RESP-CTAB Neuro- resting tremor, worse with arms outstretched Mood- a little anxious appearing, not depressed, well groomed, normal speech EXT- No edema Pulses- Radial, 2+        Assessment & Plan:      Problem List Items Addressed This Visit      Unprioritized   Tremor   Essential hypertension   Relevant Medications   propranolol (INDERAL) IR tablet   Elevated LFTs   Relevant Orders   Comprehensive metabolic panel   Alcohol abuse - Primary   Relevant Orders   Comprehensive metabolic panel   Ethanol      Note: This dictation was prepared with Dragon dictation along with smaller phrase technology. Any transcriptional errors that result from this process are unintentional.

## 2014-05-05 NOTE — Assessment & Plan Note (Signed)
Will be done before GI appt

## 2014-05-05 NOTE — Assessment & Plan Note (Signed)
Blood pressure much improved today, no change to meds

## 2014-05-05 NOTE — Assessment & Plan Note (Signed)
Continue prozac Add propranolol to help with the anxiety and Tremor

## 2014-05-05 NOTE — Assessment & Plan Note (Signed)
Discussed importance of ETOH cessation,states he drinks 2-3 beers not every night but heavy on weekends, I think his tremor is also associated with his alcohol/anxiety

## 2014-05-05 NOTE — Patient Instructions (Addendum)
Do not eat before stomach doctor appointment- CHolesterol needs to be checked Liver check today Continue current medications Try propranolol 1 tablet twice a day for tremor ( wait until you see the neurologist)  F/u 3 MONTHS

## 2014-05-05 NOTE — Assessment & Plan Note (Signed)
Per above add propranolol , I did tell pt to wait until see by neurology first, before starting in case of medication change

## 2014-05-06 ENCOUNTER — Ambulatory Visit: Payer: Medicare Other | Admitting: Neurology

## 2014-05-06 LAB — COMPREHENSIVE METABOLIC PANEL
ALT: 23 U/L (ref 0–53)
AST: 69 U/L — ABNORMAL HIGH (ref 0–37)
Albumin: 3.3 g/dL — ABNORMAL LOW (ref 3.5–5.2)
Alkaline Phosphatase: 191 U/L — ABNORMAL HIGH (ref 39–117)
BILIRUBIN TOTAL: 1 mg/dL (ref 0.2–1.2)
BUN: 14 mg/dL (ref 6–23)
CO2: 25 meq/L (ref 19–32)
CREATININE: 0.8 mg/dL (ref 0.50–1.35)
Calcium: 8.7 mg/dL (ref 8.4–10.5)
Chloride: 107 mEq/L (ref 96–112)
Glucose, Bld: 157 mg/dL — ABNORMAL HIGH (ref 70–99)
Potassium: 4.5 mEq/L (ref 3.5–5.3)
Sodium: 140 mEq/L (ref 135–145)
Total Protein: 7.4 g/dL (ref 6.0–8.3)

## 2014-05-06 LAB — ETHANOL: Alcohol, Ethyl (B): 118 mg/dL — ABNORMAL HIGH (ref 0–10)

## 2014-05-15 ENCOUNTER — Encounter: Payer: Self-pay | Admitting: Neurology

## 2014-05-15 ENCOUNTER — Ambulatory Visit (INDEPENDENT_AMBULATORY_CARE_PROVIDER_SITE_OTHER): Payer: Medicare Other | Admitting: Neurology

## 2014-05-15 VITALS — BP 128/72 | HR 76 | Ht 74.0 in | Wt 209.4 lb

## 2014-05-15 DIAGNOSIS — R202 Paresthesia of skin: Secondary | ICD-10-CM | POA: Diagnosis not present

## 2014-05-15 DIAGNOSIS — R251 Tremor, unspecified: Secondary | ICD-10-CM

## 2014-05-15 DIAGNOSIS — M79622 Pain in left upper arm: Secondary | ICD-10-CM

## 2014-05-15 DIAGNOSIS — M25522 Pain in left elbow: Secondary | ICD-10-CM

## 2014-05-15 DIAGNOSIS — M25529 Pain in unspecified elbow: Secondary | ICD-10-CM | POA: Insufficient documentation

## 2014-05-15 NOTE — Patient Instructions (Signed)

## 2014-05-15 NOTE — Progress Notes (Signed)
Reason for visit: Left arm pain and numbness  Referring physician: Dr. Julieta Bellini is a 54 y.o. male  History of present illness:  [name] is a 54 year old right-handed white male with a history of alcohol overuse. The patient had a fall about 6 weeks ago, and he landed on his left hand and elbow. The patient indicates that he fractured his left elbow, and he has had numbness into the thumb, second, third, and fourth fingers of his left hand. The patient has discomfort around the left elbow. He denies any neck pain or pain down the arm. The patient denies problems with the right arm or with the lower extremities. He believes there is weakness in the hand, he is dropping things from the hand. He denies issues with changes in balance or difficulty controlling the bowels or the bladder. He indicates that he had recent MRI evaluations of the hand and elbow done at [facility], but the reports of this study are not available to me. The patient is sent to this office for further evaluation.  Past Medical History  Diagnosis Date  . Hypertension   . Stroke 2003  . Chronic back pain   . DDD (degenerative disc disease), lumbosacral   . Hx of cardiac cath 1996  . Hyperlipidemia   . History of alcohol abuse   . Anxiety   . Depression   . Elevated LFTs     secondary to ETOH    Past Surgical History  Procedure Laterality Date  . Cardiac catheterization  1996    Family History  Problem Relation Age of Onset  . Heart disease Mother   . Cancer Father   . Cancer Brother     melanoma  . Thyroid disease Sister     Social history:  reports that he has never smoked. His smokeless tobacco use includes Chew. He reports that he drinks alcohol. He reports that he does not use illicit drugs.  Medications:  Prior to Admission medications   Medication Sig Start Date End Date Taking? Authorizing Provider  aspirin EC 325 MG tablet Take 325 mg by mouth every evening.    Yes  Historical Provider, MD  cyclobenzaprine (FLEXERIL) 10 MG tablet Take 1 tablet (10 mg total) by mouth 2 (two) times daily as needed. 04/08/14  Yes [name], MD  EPINEPHrine 0.3 mg/0.3 mL IJ SOAJ injection Inject 0.3 mLs (0.3 mg total) into the muscle once. 04/08/14  Yes [name], MD  FLUoxetine (PROZAC) 20 MG capsule Take 1 capsule (20 mg total) by mouth at bedtime. 04/07/14  Yes [name], MD  lisinopril-hydrochlorothiazide (PRINZIDE,ZESTORETIC) 10-12.5 MG per tablet Take 1 tablet by mouth daily. 04/07/14  Yes [name], MD  oxyCODONE (ROXICODONE) 5 MG immediate release tablet Take 1 tablet (5 mg total) by mouth every 6 (six) hours as needed for severe pain. 04/07/14  Yes [name], MD  propranolol (INDERAL) 10 MG tablet Take 1 tablet (10 mg total) by mouth 2 (two) times daily. 05/05/14  Yes [name], MD      Allergies  Allergen Reactions  . Bee Venom Anaphylaxis and Nausea Only    ROS:  Out of a complete 14 system review of symptoms, the patient complains only of the following symptoms, and all other reviewed systems are negative.  Joint pain Numbness  Blood pressure 128/72, pulse 76, height 6\' 2"  (1.88 m), weight 209 lb 6.4 oz (94.983 kg).  Physical  Exam  General: The patient is alert and cooperative at the time of the examination.  Eyes: Pupils are equal, round, and reactive to light. Discs are flat bilaterally.  Neck: The neck is supple, no carotid bruits are noted.  Respiratory: The respiratory examination is clear.  Cardiovascular: The cardiovascular examination reveals a regular rate and rhythm, no obvious murmurs or rubs are noted.  Skin: Extremities are without significant edema.  Neurologic Exam  Mental status: The patient is alert and oriented x 3 at the time of the examination. The patient has apparent normal recent and remote memory, with an apparently normal attention span and concentration ability.  Cranial nerves:  Facial symmetry is present. There is good sensation of the face to pinprick and soft touch bilaterally. The strength of the facial muscles and the muscles to head turning and shoulder shrug are normal bilaterally. Speech is well enunciated, no aphasia or dysarthria is noted. Extraocular movements are full. Visual fields are full. The tongue is midline, and the patient has symmetric elevation of the soft palate. No obvious hearing deficits are noted.  Motor: The motor testing reveals 5 over 5 strength of all 4 extremities. No definite weakness is seen, the patient does have some giveaway weakness on the left hand and arm. Good symmetric motor tone is noted throughout.  Sensory: Sensory testing is intact to pinprick, soft touch, vibration sensation, and position sense on all 4 extremities, with exception that there is some decreased pinprick sensation on the left hand, with decreased vibration and position sense is well. There is some decrease in position and of the left foot. No evidence of extinction is noted.  Coordination: Cerebellar testing reveals good finger-nose-finger and heel-to-shin bilaterally. The patient is tremulous on all 4 extremities.  Gait and station: Gait is slightly wide-based. Tandem gait is unsteady. Romberg is negative. No drift is seen.  Reflexes: Deep tendon reflexes are symmetric and normal bilaterally. Toes are downgoing bilaterally.   CT head 03/02/14:  IMPRESSION: 1. No acute intracranial pathology seen on CT. 2. Mild cortical volume loss and scattered small vessel ischemic Microangiopathy.  * CT scan images were reviewed online. I agree with the written report.   Assessment/Plan:  1. Left arm numbness, pain  2. Tremor  The patient has fallen recently and he has sustained injury to the left elbow. The patient reports numbness in the left hand associated with some weakness. No definite weakness was seen on clinical examination, the patient appears to have a lot  of giveaway with motor testing. He will be set up for nerve conduction studies on both arms, and EMG on the left arm. He will return for the study.  [name] Alexanders MD 05/15/2014 6:22 PM  [facility] [address] [place], [place] [address]  Phone [phone] Fax [phone]

## 2014-05-28 ENCOUNTER — Ambulatory Visit (INDEPENDENT_AMBULATORY_CARE_PROVIDER_SITE_OTHER): Payer: Medicare Other | Admitting: Internal Medicine

## 2014-05-28 ENCOUNTER — Encounter: Payer: Medicare Other | Admitting: Neurology

## 2014-05-29 ENCOUNTER — Encounter: Payer: Self-pay | Admitting: Neurology

## 2014-05-29 ENCOUNTER — Ambulatory Visit (INDEPENDENT_AMBULATORY_CARE_PROVIDER_SITE_OTHER): Payer: Self-pay | Admitting: Neurology

## 2014-05-29 ENCOUNTER — Encounter: Payer: Medicare Other | Admitting: Neurology

## 2014-05-29 ENCOUNTER — Ambulatory Visit (INDEPENDENT_AMBULATORY_CARE_PROVIDER_SITE_OTHER): Payer: Medicare Other | Admitting: Neurology

## 2014-05-29 DIAGNOSIS — M79622 Pain in left upper arm: Secondary | ICD-10-CM

## 2014-05-29 DIAGNOSIS — R251 Tremor, unspecified: Secondary | ICD-10-CM

## 2014-05-29 DIAGNOSIS — R202 Paresthesia of skin: Secondary | ICD-10-CM | POA: Diagnosis not present

## 2014-05-29 DIAGNOSIS — M25522 Pain in left elbow: Secondary | ICD-10-CM

## 2014-05-29 MED ORDER — TRAMADOL HCL 50 MG PO TABS: 50.0000 mg | ORAL_TABLET | Freq: Four times a day (QID) | ORAL | Status: DC | PRN

## 2014-05-29 NOTE — Progress Notes (Signed)
[name] is a 54 year old gentleman with a history of a fall and suffered injuries to the left elbow. The patient has reported some numbness and tingling sensations into the thumb, index finger, and middle finger on the left hand. He has gained some sensation back since the fall. He comes in today for EMG and nerve conduction study evaluation.  Nerve conduction and EMG shows diffuse sensory abnormalities in both arms, the possibility of an overlying peripheral neuropathy needs to be considered. EMG of the left arm does not show evidence of an acute mononeuropathy, and no evidence of an overlying cervical radiculopathy.  The patient will be followed conservatively, we may consider blood work in the future. He will follow-up in 3-4 months.

## 2014-05-29 NOTE — Progress Notes (Signed)
Please refer to EMG and nerve conduction study procedure note. 

## 2014-05-29 NOTE — Procedures (Signed)
     HISTORY:  [name] is a 54 year old gentleman with a history of a fall with injury to the left elbow. The patient reports numbness in the thumb, index, and middle finger of the left hand. The patient is being evaluated for a possible neuropathy or a cervical radiculopathy. He denies any neck discomfort.  NERVE CONDUCTION STUDIES:  Nerve conduction studies were performed on both upper extremities. The distal motor latencies and motor amplitudes for the median nerves were normal bilaterally. The distal motor latencies for the ulnar nerves were normal on the right, borderline normal on the left, with normal motor amplitudes bilaterally. The F wave latencies for the median and ulnar nerves were prolonged bilaterally, with normal nerve conduction velocities for these nerves bilaterally. The sensory latencies for the median, ulnar, and radial nerves were prolonged bilaterally.  EMG STUDIES:  EMG study was performed on the left upper extremity:  The first dorsal interosseous muscle reveals 2 to 5 K units with slightly decreased recruitment. No fibrillations or positive waves were noted. The abductor pollicis brevis muscle reveals 2 to 4 K units with full recruitment. No fibrillations or positive waves were noted. The extensor indicis proprius muscle reveals 1 to 3 K units with full recruitment. No fibrillations or positive waves were noted. The pronator teres muscle reveals 2 to 3 K units with full recruitment. No fibrillations or positive waves were noted. The flexor digitorum profundus muscle (III-IV) reveals 1 to 3 K units with full recruitment. No fibrillations or positive waves were seen. The biceps muscle reveals 1 to 2 K units with full recruitment. No fibrillations or positive waves were noted. The triceps muscle reveals 2 to 4 K units with full recruitment. No fibrillations or positive waves were noted. The anterior deltoid muscle reveals 2 to 3 K units with full recruitment. No  fibrillations or positive waves were noted. The cervical paraspinal muscles were tested at 2 levels. No abnormalities of insertional activity were seen at either level tested. There was fair relaxation.   IMPRESSION:  Nerve conduction studies done on both upper extremities reveals diffuse mild sensory abnormalities. The possibility of an overlying peripheral neuropathy should be considered. EMG evaluation of the left upper extremity is relatively unremarkable, without clear evidence of a focal neuropathy involving the left arm, and no evidence of an overlying cervical radiculopathy.  Jill Alexanders MD 05/29/2014 2:31 PM  [facility] [address] [place], [place] [address]  Phone [phone] Fax [phone]

## 2014-06-19 ENCOUNTER — Other Ambulatory Visit (INDEPENDENT_AMBULATORY_CARE_PROVIDER_SITE_OTHER): Payer: Self-pay | Admitting: *Deleted

## 2014-06-19 ENCOUNTER — Telehealth (INDEPENDENT_AMBULATORY_CARE_PROVIDER_SITE_OTHER): Payer: Self-pay | Admitting: *Deleted

## 2014-06-19 ENCOUNTER — Ambulatory Visit (INDEPENDENT_AMBULATORY_CARE_PROVIDER_SITE_OTHER): Payer: Medicare Other | Admitting: Internal Medicine

## 2014-06-19 ENCOUNTER — Encounter (INDEPENDENT_AMBULATORY_CARE_PROVIDER_SITE_OTHER): Payer: Self-pay | Admitting: Internal Medicine

## 2014-06-19 VITALS — BP 92/38 | HR 80 | Temp 98.0°F | Ht 74.0 in | Wt 209.7 lb

## 2014-06-19 DIAGNOSIS — R748 Abnormal levels of other serum enzymes: Secondary | ICD-10-CM | POA: Diagnosis not present

## 2014-06-19 DIAGNOSIS — Z1211 Encounter for screening for malignant neoplasm of colon: Secondary | ICD-10-CM

## 2014-06-19 DIAGNOSIS — K760 Fatty (change of) liver, not elsewhere classified: Secondary | ICD-10-CM

## 2014-06-19 DIAGNOSIS — Z86010 Personal history of colonic polyps: Secondary | ICD-10-CM

## 2014-06-19 NOTE — Patient Instructions (Signed)
Labs today, OV in 3 months

## 2014-06-19 NOTE — Progress Notes (Signed)
Subjective:    Patient ID: [name], male    DOB: 09-26-1960, 54 y.o.   MRN: [id number]  HPI Referred to our office by Dr. Buelah Manis for elevated liver enzymes. He tells me in the past he has been a heavy drinker.   When he drank heavy he would drink 4 times a weeks. He would drink a 12 pack a day every day. He slowed his drinking down a couple of years ago. On the weekends he will drink at least a case of beer from Friday to Sunday. No prior hx of IV drugs. No tattoos.  Appetite is good. No weight loss. No abdominal pain. He usually has a BM x 3  every day. No melena or BRRB.   04/16/2014 US abdomen: elevated liver enzymes 1. Thickened gallbladder wall. This could be related to hyperproteinemia states. Acalculous cholecystitis cannot be excluded. No gallstones noted. 2. Echogenic prominent liver suggesting fatty infiltration and/or hepatocellular disease.  CMP Latest Ref Rng 05/05/2014 04/07/2014 03/02/2014  Glucose 70 - 99 mg/dL 157(H) 134(H) 144(H)  BUN 6 - 23 mg/dL 14 8 15   Creatinine 0.50 - 1.35 mg/dL 0.80 0.68 0.87  Sodium 135 - 145 mEq/L 140 139 137  Potassium 3.5 - 5.3 mEq/L 4.5 4.1 4.1  Chloride 96 - 112 mEq/L 107 102 105  CO2 19 - 32 mEq/L 25 26 24   Calcium 8.4 - 10.5 mg/dL 8.7 8.9 8.6  Total Protein 6.0 - 8.3 g/dL 7.4 7.4 7.8  Total Bilirubin 0.2 - 1.2 mg/dL 1.0 1.0 2.1(H)  Alkaline Phos 39 - 117 U/L 191(H) 178(H) 175(H)  AST 0 - 37 U/L 69(H) 90(H) 122(H)  ALT 0 - 53 U/L 23 30 34   09/23/2009 Colonoscopy with polypectomy Dr. Geroge Baseman One 73mm sesile mid transverse colon polyp removed via cold forceps. One 33mm sessile proximal transverse colon polyp removed via cold forcepts. One 24mm descending colon polyp removed via cold forceps. One 24mm sessile sigmoid colon polyp removed via cold forcepts. Two 2 mm sessile rectal polyps removed via cold forceps. Polyps less than 56mm would have been missed. Frequent descenind colon, sigmoid colon diverticula. Otherwise no masses,  inflammatory changes or AVMs seen. 1. COLON, POLYP(S), TRANSVERSE : - TUBULAR ADENOMA (TWO FRAGMENTS). - POLYPOID FRAGMENT OF BENIGN COLONIC MUCOSA. - NO HIGH GRADE DYSPLASIA OR MALIGNANCY IDENTIFIED. 2. COLON, POLYP(S), DESCENDING, SIGMOID, AND RECTAL : - HYPERPLASTIC POLYP AND POLYPOID FRAGMENTS OF BENIGN COLONIC MUCOSA, NO EVIDENCE OF ADENOMATOUS CHANGES  Review of Systems Past Medical History  Diagnosis Date  . Hypertension   . Stroke 2003  . Chronic back pain   . DDD (degenerative disc disease), lumbosacral   . Hx of cardiac cath 1996  . Hyperlipidemia   . History of alcohol abuse   . Anxiety   . Depression   . Elevated LFTs     secondary to ETOH    Past Surgical History  Procedure Laterality Date  . Cardiac catheterization  1996    Allergies  Allergen Reactions  . Bee Venom Anaphylaxis and Nausea Only    Current Outpatient Prescriptions on File Prior to Visit  Medication Sig Dispense Refill  . aspirin EC 325 MG tablet Take 325 mg by mouth every evening.     . cyclobenzaprine (FLEXERIL) 10 MG tablet Take 1 tablet (10 mg total) by mouth 2 (two) times daily as needed. 45 tablet 3  . EPINEPHrine 0.3 mg/0.3 mL IJ SOAJ injection Inject 0.3 mLs (0.3 mg total) into the muscle once. 1  Device 1  . FLUoxetine (PROZAC) 20 MG capsule Take 1 capsule (20 mg total) by mouth at bedtime. 30 capsule 3  . lisinopril-hydrochlorothiazide (PRINZIDE,ZESTORETIC) 10-12.5 MG per tablet Take 1 tablet by mouth daily. 30 tablet 3  . oxyCODONE (ROXICODONE) 5 MG immediate release tablet Take 1 tablet (5 mg total) by mouth every 6 (six) hours as needed for severe pain. 30 tablet 0  . propranolol (INDERAL) 10 MG tablet Take 1 tablet (10 mg total) by mouth 2 (two) times daily. 60 tablet 3  . traMADol (ULTRAM) 50 MG tablet Take 1 tablet (50 mg total) by mouth every 6 (six) hours as needed. 40 tablet 1   No current facility-administered medications on file prior to visit.        Objective:    Physical ExamBlood pressure 92/38, pulse 80, temperature 98 F (36.7 C), height 6\' 2"  (1.88 m), weight 209 lb 11.2 oz (95.119 kg). Alert and oriented. Skin warm and dry. Oral mucosa is moist.   . Sclera anicteric, conjunctivae is pink. Thyroid not enlarged. No cervical lymphadenopathy. Lungs clear. Heart regular rate and rhythm.  Abdomen is soft. Bowel sounds are positive. No hepatomegaly. No abdominal masses felt. No tenderness.  No edema to lower extremities. Patient has a tremor to both hand but not a flap. (Family hx of tremor)        Assessment & Plan:  Elevated liver enzymes with fatty liver probably due to Etoh abuse. Will rule out Hepatitis C and autoimmune disease. CBC, Hepatic function, Hep C quain, SMA, ANA, mitochonidrial antibody, Ferritin, Ceruloplasmin Alpha 1 antitrypsin OV in 3 months. Patient is also due for a colonoscopy. Will schedule.

## 2014-06-19 NOTE — Telephone Encounter (Signed)
Patient needs trilyte 

## 2014-06-20 LAB — CBC WITH DIFFERENTIAL/PLATELET
BASOS PCT: 1 % (ref 0–1)
Basophils Absolute: 0.1 10*3/uL (ref 0.0–0.1)
EOS ABS: 0.2 10*3/uL (ref 0.0–0.7)
Eosinophils Relative: 2 % (ref 0–5)
HEMATOCRIT: 35.5 % — AB (ref 39.0–52.0)
Hemoglobin: 12.2 g/dL — ABNORMAL LOW (ref 13.0–17.0)
Lymphocytes Relative: 19 % (ref 12–46)
Lymphs Abs: 1.7 10*3/uL (ref 0.7–4.0)
MCH: 35.5 pg — ABNORMAL HIGH (ref 26.0–34.0)
MCHC: 34.4 g/dL (ref 30.0–36.0)
MCV: 103.2 fL — AB (ref 78.0–100.0)
MONO ABS: 0.9 10*3/uL (ref 0.1–1.0)
MONOS PCT: 10 % (ref 3–12)
MPV: 10.9 fL (ref 8.6–12.4)
Neutro Abs: 5.9 10*3/uL (ref 1.7–7.7)
Neutrophils Relative %: 68 % (ref 43–77)
Platelets: 138 10*3/uL — ABNORMAL LOW (ref 150–400)
RBC: 3.44 MIL/uL — ABNORMAL LOW (ref 4.22–5.81)
RDW: 15.2 % (ref 11.5–15.5)
WBC: 8.7 10*3/uL (ref 4.0–10.5)

## 2014-06-20 LAB — HEPATITIS C ANTIBODY: HCV Ab: NEGATIVE

## 2014-06-20 LAB — ANTI-SMOOTH MUSCLE ANTIBODY, IGG: SMOOTH MUSCLE AB: 8 U (ref <20)

## 2014-06-20 LAB — ANA: Anti Nuclear Antibody(ANA): NEGATIVE

## 2014-06-20 LAB — FERRITIN: Ferritin: 280 ng/mL (ref 22–322)

## 2014-06-23 LAB — CERULOPLASMIN: CERULOPLASMIN: 23 mg/dL (ref 18–36)

## 2014-06-25 LAB — MITOCHONDRIAL ANTIBODIES: Mitochondrial M2 Ab, IgG: 1.16 — ABNORMAL HIGH (ref <0.91)

## 2014-06-26 MED ORDER — PEG 3350-KCL-NA BICARB-NACL 420 G PO SOLR: 4000.0000 mL | Freq: Once | ORAL | Status: DC

## 2014-07-26 ENCOUNTER — Encounter (HOSPITAL_COMMUNITY): Payer: Self-pay | Admitting: Neurological Surgery

## 2014-07-26 ENCOUNTER — Emergency Department (HOSPITAL_COMMUNITY): Payer: Medicare Other

## 2014-07-26 ENCOUNTER — Inpatient Hospital Stay (HOSPITAL_COMMUNITY)
Admission: EM | Admit: 2014-07-26 | Discharge: 2014-07-30 | DRG: 552 | Disposition: A | Discharge status: Home / Self Care | Payer: Medicare Other | Attending: Internal Medicine | Admitting: Internal Medicine

## 2014-07-26 DIAGNOSIS — E861 Hypovolemia: Secondary | ICD-10-CM | POA: Diagnosis present

## 2014-07-26 DIAGNOSIS — N179 Acute kidney failure, unspecified: Secondary | ICD-10-CM | POA: Diagnosis present

## 2014-07-26 DIAGNOSIS — K219 Gastro-esophageal reflux disease without esophagitis: Secondary | ICD-10-CM | POA: Diagnosis present

## 2014-07-26 DIAGNOSIS — F10929 Alcohol use, unspecified with intoxication, unspecified: Secondary | ICD-10-CM

## 2014-07-26 DIAGNOSIS — E872 Acidosis, unspecified: Secondary | ICD-10-CM

## 2014-07-26 DIAGNOSIS — F1010 Alcohol abuse, uncomplicated: Secondary | ICD-10-CM | POA: Diagnosis not present

## 2014-07-26 DIAGNOSIS — E860 Dehydration: Secondary | ICD-10-CM | POA: Diagnosis present

## 2014-07-26 DIAGNOSIS — S22089G Unspecified fracture of T11-T12 vertebra, subsequent encounter for fracture with delayed healing: Secondary | ICD-10-CM | POA: Diagnosis not present

## 2014-07-26 DIAGNOSIS — F419 Anxiety disorder, unspecified: Secondary | ICD-10-CM | POA: Diagnosis present

## 2014-07-26 DIAGNOSIS — Z7982 Long term (current) use of aspirin: Secondary | ICD-10-CM

## 2014-07-26 DIAGNOSIS — I10 Essential (primary) hypertension: Secondary | ICD-10-CM | POA: Diagnosis present

## 2014-07-26 DIAGNOSIS — I959 Hypotension, unspecified: Secondary | ICD-10-CM | POA: Diagnosis present

## 2014-07-26 DIAGNOSIS — S22009A Unspecified fracture of unspecified thoracic vertebra, initial encounter for closed fracture: Secondary | ICD-10-CM

## 2014-07-26 DIAGNOSIS — F10129 Alcohol abuse with intoxication, unspecified: Secondary | ICD-10-CM | POA: Diagnosis present

## 2014-07-26 DIAGNOSIS — T1490XA Injury, unspecified, initial encounter: Secondary | ICD-10-CM

## 2014-07-26 DIAGNOSIS — S22089A Unspecified fracture of T11-T12 vertebra, initial encounter for closed fracture: Secondary | ICD-10-CM | POA: Diagnosis present

## 2014-07-26 DIAGNOSIS — E876 Hypokalemia: Secondary | ICD-10-CM | POA: Diagnosis present

## 2014-07-26 DIAGNOSIS — M542 Cervicalgia: Secondary | ICD-10-CM

## 2014-07-26 LAB — PREPARE FRESH FROZEN PLASMA
Unit division: 0
Unit division: 0

## 2014-07-26 LAB — URINALYSIS, ROUTINE W REFLEX MICROSCOPIC
Bilirubin Urine: NEGATIVE
Glucose, UA: NEGATIVE mg/dL
HGB URINE DIPSTICK: NEGATIVE
KETONES UR: NEGATIVE mg/dL
Leukocytes, UA: NEGATIVE
Nitrite: NEGATIVE
PROTEIN: NEGATIVE mg/dL
Specific Gravity, Urine: 1.005 (ref 1.005–1.030)
Urobilinogen, UA: 1 mg/dL (ref 0.0–1.0)
pH: 7 (ref 5.0–8.0)

## 2014-07-26 LAB — CBC WITH DIFFERENTIAL/PLATELET
BASOS ABS: 0 10*3/uL (ref 0.0–0.1)
BASOS PCT: 0 % (ref 0–1)
Basophils Absolute: 0 10*3/uL (ref 0.0–0.1)
Basophils Relative: 0 % (ref 0–1)
Eosinophils Absolute: 0.1 10*3/uL (ref 0.0–0.7)
Eosinophils Absolute: 0.1 10*3/uL (ref 0.0–0.7)
Eosinophils Relative: 1 % (ref 0–5)
Eosinophils Relative: 1 % (ref 0–5)
HCT: 33.6 % — ABNORMAL LOW (ref 39.0–52.0)
HEMATOCRIT: 32.8 % — AB (ref 39.0–52.0)
HEMOGLOBIN: 11.3 g/dL — AB (ref 13.0–17.0)
Hemoglobin: 11.9 g/dL — ABNORMAL LOW (ref 13.0–17.0)
LYMPHS ABS: 1.3 10*3/uL (ref 0.7–4.0)
Lymphocytes Relative: 17 % (ref 12–46)
Lymphocytes Relative: 15 % (ref 12–46)
Lymphs Abs: 1.6 10*3/uL (ref 0.7–4.0)
MCH: 36.1 pg — ABNORMAL HIGH (ref 26.0–34.0)
MCH: 35.2 pg — AB (ref 26.0–34.0)
MCHC: 35.4 g/dL (ref 30.0–36.0)
MCHC: 34.5 g/dL (ref 30.0–36.0)
MCV: 101.8 fL — AB (ref 78.0–100.0)
MCV: 102.2 fL — AB (ref 78.0–100.0)
MONOS PCT: 9 % (ref 3–12)
MONOS PCT: 9 % (ref 3–12)
Monocytes Absolute: 0.9 10*3/uL (ref 0.1–1.0)
Monocytes Absolute: 0.7 10*3/uL (ref 0.1–1.0)
NEUTROS ABS: 7 10*3/uL (ref 1.7–7.7)
NEUTROS ABS: 6.5 10*3/uL (ref 1.7–7.7)
NEUTROS PCT: 72 % (ref 43–77)
NEUTROS PCT: 76 % (ref 43–77)
PLATELETS: 106 10*3/uL — AB (ref 150–400)
Platelets: 85 10*3/uL — ABNORMAL LOW (ref 150–400)
RBC: 3.3 MIL/uL — AB (ref 4.22–5.81)
RBC: 3.21 MIL/uL — ABNORMAL LOW (ref 4.22–5.81)
RDW: 15 % (ref 11.5–15.5)
RDW: 15.1 % (ref 11.5–15.5)
WBC: 9.6 10*3/uL (ref 4.0–10.5)
WBC: 8.6 10*3/uL (ref 4.0–10.5)

## 2014-07-26 LAB — COMPREHENSIVE METABOLIC PANEL
ALBUMIN: 2.6 g/dL — AB (ref 3.5–5.0)
ALK PHOS: 120 U/L (ref 38–126)
ALT: 28 U/L (ref 17–63)
AST: 75 U/L — ABNORMAL HIGH (ref 15–41)
Anion gap: 11 (ref 5–15)
BUN: 8 mg/dL (ref 6–20)
CHLORIDE: 103 mmol/L (ref 101–111)
CO2: 20 mmol/L — ABNORMAL LOW (ref 22–32)
Calcium: 7.3 mg/dL — ABNORMAL LOW (ref 8.9–10.3)
Creatinine, Ser: 1.43 mg/dL — ABNORMAL HIGH (ref 0.61–1.24)
GFR calc Af Amer: >60 mL/min (ref >60)
GFR, EST NON AFRICAN AMERICAN: 54 mL/min — AB (ref >60)
GLUCOSE: 109 mg/dL — AB (ref 65–99)
Potassium: 3.8 mmol/L (ref 3.5–5.1)
SODIUM: 134 mmol/L — AB (ref 135–145)
Total Bilirubin: 1.9 mg/dL — ABNORMAL HIGH (ref 0.3–1.2)
Total Protein: 6.5 g/dL (ref 6.5–8.1)

## 2014-07-26 LAB — I-STAT CG4 LACTIC ACID, ED: LACTIC ACID, VENOUS: 4.46 mmol/L — AB (ref 0.5–2.0)

## 2014-07-26 LAB — I-STAT CHEM 8, ED
BUN: 10 mg/dL (ref 6–20)
CHLORIDE: 101 mmol/L (ref 101–111)
CREATININE: 2.4 mg/dL — AB (ref 0.61–1.24)
Calcium, Ion: 0.99 mmol/L — ABNORMAL LOW (ref 1.12–1.23)
GLUCOSE: 105 mg/dL — AB (ref 65–99)
HCT: 37 % — ABNORMAL LOW (ref 39.0–52.0)
HEMOGLOBIN: 12.6 g/dL — AB (ref 13.0–17.0)
Potassium: 3.8 mmol/L (ref 3.5–5.1)
Sodium: 138 mmol/L (ref 135–145)
TCO2: 19 mmol/L (ref 0–100)

## 2014-07-26 LAB — PROTIME-INR
INR: 1.52 — ABNORMAL HIGH (ref 0.00–1.49)
Prothrombin Time: 18.4 seconds — ABNORMAL HIGH (ref 11.6–15.2)

## 2014-07-26 LAB — ETHANOL: Alcohol, Ethyl (B): 296 mg/dL — ABNORMAL HIGH (ref <5)

## 2014-07-26 LAB — APTT: aPTT: 36 seconds (ref 24–37)

## 2014-07-26 MED ORDER — SODIUM CHLORIDE 0.9 % IV BOLUS (SEPSIS)
1000.0000 mL | Freq: Once | INTRAVENOUS | Status: AC
Start: 2014-07-26 — End: 2014-07-26
  Administered 2014-07-26: 1000 mL via INTRAVENOUS

## 2014-07-26 MED ORDER — LORAZEPAM 2 MG/ML IJ SOLN
2.0000 mg | INTRAMUSCULAR | Status: DC | PRN
  Administered 2014-07-27 (×4): 2 mg via INTRAVENOUS
  Filled 2014-07-26 (×4): qty 1

## 2014-07-26 MED ORDER — SODIUM CHLORIDE 0.9 % IJ SOLN
3.0000 mL | Freq: Two times a day (BID) | INTRAMUSCULAR | Status: DC
  Administered 2014-07-27 – 2014-07-30 (×5): 3 mL via INTRAVENOUS

## 2014-07-26 MED ORDER — ACETAMINOPHEN 325 MG PO TABS
650.0000 mg | ORAL_TABLET | Freq: Four times a day (QID) | ORAL | Status: DC | PRN
  Administered 2014-07-28: 650 mg via ORAL
  Filled 2014-07-26: qty 2

## 2014-07-26 MED ORDER — SODIUM CHLORIDE 0.9 % IV BOLUS (SEPSIS)
1000.0000 mL | Freq: Once | INTRAVENOUS | Status: AC
  Administered 2014-07-26: 1000 mL via INTRAVENOUS

## 2014-07-26 MED ORDER — CYCLOBENZAPRINE HCL 5 MG PO TABS
5.0000 mg | ORAL_TABLET | Freq: Three times a day (TID) | ORAL | Status: DC | PRN
  Administered 2014-07-27 – 2014-07-29 (×5): 5 mg via ORAL
  Filled 2014-07-26 (×5): qty 1

## 2014-07-26 MED ORDER — ACETAMINOPHEN 650 MG RE SUPP: 650.0000 mg | Freq: Four times a day (QID) | RECTAL | Status: DC | PRN

## 2014-07-26 MED ORDER — ONDANSETRON HCL 4 MG/2ML IJ SOLN: 4.0000 mg | Freq: Four times a day (QID) | INTRAMUSCULAR | Status: DC | PRN

## 2014-07-26 MED ORDER — ONDANSETRON HCL 4 MG PO TABS
4.0000 mg | ORAL_TABLET | Freq: Four times a day (QID) | ORAL | Status: DC | PRN
Start: 2014-07-26 — End: 2014-07-30

## 2014-07-26 MED ORDER — THIAMINE HCL 100 MG/ML IJ SOLN
Freq: Once | INTRAVENOUS | Status: AC
  Administered 2014-07-27: 01:00:00 via INTRAVENOUS
  Filled 2014-07-26: qty 1000
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
## 2014-07-26 NOTE — ED Notes (Signed)
Patient transported to X-ray 

## 2014-07-26 NOTE — ED Notes (Signed)
Dr Miller at bedside. 

## 2014-07-26 NOTE — ED Notes (Signed)
Pt to xray at this time.

## 2014-07-26 NOTE — ED Notes (Signed)
Dr Sabra Heck given a copy of lactic acid results 4.46
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
## 2014-07-26 NOTE — ED Notes (Signed)
Pt returned from x-ray, c/o pain in upper back
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
## 2014-07-26 NOTE — ED Notes (Signed)
Dr. Patel at bedside 

## 2014-07-26 NOTE — ED Notes (Signed)
Fast exam competed by Dr. Hazle Nordmann and Res.
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
## 2014-07-27 ENCOUNTER — Inpatient Hospital Stay (HOSPITAL_COMMUNITY): Payer: Medicare Other

## 2014-07-27 ENCOUNTER — Encounter (HOSPITAL_COMMUNITY): Payer: Self-pay | Admitting: *Deleted

## 2014-07-27 DIAGNOSIS — I959 Hypotension, unspecified: Secondary | ICD-10-CM | POA: Diagnosis present

## 2014-07-27 DIAGNOSIS — S22089A Unspecified fracture of T11-T12 vertebra, initial encounter for closed fracture: Secondary | ICD-10-CM | POA: Diagnosis present

## 2014-07-27 DIAGNOSIS — E861 Hypovolemia: Secondary | ICD-10-CM | POA: Diagnosis present

## 2014-07-27 LAB — CBC WITH DIFFERENTIAL/PLATELET
BASOS PCT: 1 % (ref 0–1)
Basophils Absolute: 0 10*3/uL (ref 0.0–0.1)
EOS ABS: 0.2 10*3/uL (ref 0.0–0.7)
Eosinophils Relative: 2 % (ref 0–5)
HEMATOCRIT: 32.3 % — AB (ref 39.0–52.0)
Hemoglobin: 11.3 g/dL — ABNORMAL LOW (ref 13.0–17.0)
LYMPHS ABS: 1.7 10*3/uL (ref 0.7–4.0)
Lymphocytes Relative: 22 % (ref 12–46)
MCH: 35.4 pg — ABNORMAL HIGH (ref 26.0–34.0)
MCHC: 35 g/dL (ref 30.0–36.0)
MCV: 101.3 fL — ABNORMAL HIGH (ref 78.0–100.0)
Monocytes Absolute: 0.7 10*3/uL (ref 0.1–1.0)
Monocytes Relative: 9 % (ref 3–12)
Neutro Abs: 5.3 10*3/uL (ref 1.7–7.7)
Neutrophils Relative %: 67 % (ref 43–77)
PLATELETS: 81 10*3/uL — AB (ref 150–400)
RBC: 3.19 MIL/uL — AB (ref 4.22–5.81)
RDW: 15.2 % (ref 11.5–15.5)
WBC: 7.9 10*3/uL (ref 4.0–10.5)

## 2014-07-27 LAB — RAPID URINE DRUG SCREEN, HOSP PERFORMED
Amphetamines: NOT DETECTED
Barbiturates: NOT DETECTED
Benzodiazepines: NOT DETECTED
Cocaine: NOT DETECTED
Opiates: NOT DETECTED
Tetrahydrocannabinol: NOT DETECTED

## 2014-07-27 LAB — COMPREHENSIVE METABOLIC PANEL
ALK PHOS: 112 U/L (ref 38–126)
ALT: 29 U/L (ref 17–63)
AST: 78 U/L — ABNORMAL HIGH (ref 15–41)
Albumin: 2.7 g/dL — ABNORMAL LOW (ref 3.5–5.0)
Anion gap: 9 (ref 5–15)
BUN: 8 mg/dL (ref 6–20)
CO2: 24 mmol/L (ref 22–32)
Calcium: 7.8 mg/dL — ABNORMAL LOW (ref 8.9–10.3)
Chloride: 107 mmol/L (ref 101–111)
Creatinine, Ser: 0.98 mg/dL (ref 0.61–1.24)
GFR calc Af Amer: >60 mL/min (ref >60)
GFR calc non Af Amer: >60 mL/min (ref >60)
Glucose, Bld: 99 mg/dL (ref 65–99)
POTASSIUM: 3.4 mmol/L — AB (ref 3.5–5.1)
Sodium: 140 mmol/L (ref 135–145)
TOTAL PROTEIN: 6.7 g/dL (ref 6.5–8.1)
Total Bilirubin: 2.4 mg/dL — ABNORMAL HIGH (ref 0.3–1.2)

## 2014-07-27 LAB — LACTIC ACID, PLASMA
LACTIC ACID, VENOUS: 2.5 mmol/L — AB (ref 0.5–2.0)
Lactic Acid, Venous: 2.4 mmol/L (ref 0.5–2.0)

## 2014-07-27 LAB — TSH: TSH: 0.572 u[IU]/mL (ref 0.350–4.500)

## 2014-07-27 LAB — MRSA PCR SCREENING: MRSA BY PCR: NEGATIVE

## 2014-07-27 MED ORDER — TRAMADOL HCL 50 MG PO TABS
50.0000 mg | ORAL_TABLET | Freq: Three times a day (TID) | ORAL | Status: DC | PRN
  Administered 2014-07-27 – 2014-07-30 (×7): 50 mg via ORAL
  Filled 2014-07-27 (×8): qty 1

## 2014-07-27 MED ORDER — FOLIC ACID 1 MG PO TABS
1.0000 mg | ORAL_TABLET | Freq: Every day | ORAL | Status: DC
  Administered 2014-07-27 – 2014-07-30 (×4): 1 mg via ORAL
  Filled 2014-07-27 (×4): qty 1

## 2014-07-27 MED ORDER — POTASSIUM CHLORIDE CRYS ER 20 MEQ PO TBCR
40.0000 meq | EXTENDED_RELEASE_TABLET | Freq: Once | ORAL | Status: AC
  Administered 2014-07-27: 40 meq via ORAL
  Filled 2014-07-27: qty 2

## 2014-07-27 MED ORDER — VITAMIN B-1 100 MG PO TABS
100.0000 mg | ORAL_TABLET | Freq: Every day | ORAL | Status: DC
  Administered 2014-07-27 – 2014-07-30 (×4): 100 mg via ORAL
  Filled 2014-07-27 (×4): qty 1

## 2014-07-27 NOTE — ED Notes (Signed)
CRITICAL VALUE ALERT  Critical value received:  lactid acid 2.4
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
## 2014-07-27 NOTE — Progress Notes (Signed)
Orthopedic Tech Progress Note Patient Details:  [name] [birth year] [id number]  Patient ID: [name], male   DOB: Jun 17, 1960, 54 y.o.   MRN: [id number] Called in bio-tech brace order; spoke with Harl Favor, Vertie Dibbern 07/27/2014, 9:09 AM

## 2014-07-28 ENCOUNTER — Telehealth (INDEPENDENT_AMBULATORY_CARE_PROVIDER_SITE_OTHER): Payer: Self-pay | Admitting: *Deleted

## 2014-07-28 DIAGNOSIS — E861 Hypovolemia: Secondary | ICD-10-CM

## 2014-07-28 MED ORDER — ALPRAZOLAM 0.5 MG PO TABS
1.0000 mg | ORAL_TABLET | Freq: Two times a day (BID) | ORAL | Status: DC
  Administered 2014-07-28 – 2014-07-30 (×5): 1 mg via ORAL
  Filled 2014-07-28 (×5): qty 2

## 2014-07-28 MED ORDER — POTASSIUM CHLORIDE CRYS ER 20 MEQ PO TBCR
40.0000 meq | EXTENDED_RELEASE_TABLET | Freq: Two times a day (BID) | ORAL | Status: AC
  Administered 2014-07-28 (×2): 40 meq via ORAL
  Filled 2014-07-28 (×2): qty 2

## 2014-07-28 MED ORDER — ENOXAPARIN SODIUM 40 MG/0.4ML ~~LOC~~ SOLN
40.0000 mg | SUBCUTANEOUS | Status: DC
  Administered 2014-07-28 – 2014-07-30 (×3): 40 mg via SUBCUTANEOUS
  Filled 2014-07-28 (×4): qty 0.4
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
## 2014-07-29 DIAGNOSIS — S22089G Unspecified fracture of T11-T12 vertebra, subsequent encounter for fracture with delayed healing: Secondary | ICD-10-CM

## 2014-07-29 DIAGNOSIS — I10 Essential (primary) hypertension: Secondary | ICD-10-CM

## 2014-07-29 MED ORDER — PROPRANOLOL HCL 10 MG PO TABS
10.0000 mg | ORAL_TABLET | Freq: Two times a day (BID) | ORAL | Status: DC
  Administered 2014-07-29 – 2014-07-30 (×2): 10 mg via ORAL
  Filled 2014-07-29 (×2): qty 1
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
## 2014-07-30 ENCOUNTER — Ambulatory Visit (HOSPITAL_COMMUNITY): Admission: RE | Admit: 2014-07-30 | Payer: Medicare Other | Source: Ambulatory Visit | Admitting: Internal Medicine

## 2014-07-30 ENCOUNTER — Encounter (HOSPITAL_COMMUNITY): Admission: RE | Payer: Self-pay | Source: Ambulatory Visit

## 2014-07-30 DIAGNOSIS — F1010 Alcohol abuse, uncomplicated: Secondary | ICD-10-CM

## 2014-07-30 DIAGNOSIS — N179 Acute kidney failure, unspecified: Secondary | ICD-10-CM

## 2014-07-30 LAB — COMPREHENSIVE METABOLIC PANEL
ALBUMIN: 2.6 g/dL — AB (ref 3.5–5.0)
ALT: 25 U/L (ref 17–63)
ANION GAP: 7 (ref 5–15)
AST: 64 U/L — ABNORMAL HIGH (ref 15–41)
Alkaline Phosphatase: 145 U/L — ABNORMAL HIGH (ref 38–126)
BUN: 8 mg/dL (ref 6–20)
CALCIUM: 8.8 mg/dL — AB (ref 8.9–10.3)
CO2: 24 mmol/L (ref 22–32)
Chloride: 103 mmol/L (ref 101–111)
Creatinine, Ser: 0.85 mg/dL (ref 0.61–1.24)
GFR calc non Af Amer: >60 mL/min (ref >60)
GLUCOSE: 109 mg/dL — AB (ref 65–99)
POTASSIUM: 4.1 mmol/L (ref 3.5–5.1)
SODIUM: 134 mmol/L — AB (ref 135–145)
Total Bilirubin: 2.2 mg/dL — ABNORMAL HIGH (ref 0.3–1.2)
Total Protein: 6.5 g/dL (ref 6.5–8.1)

## 2014-07-30 LAB — LACTIC ACID, PLASMA: Lactic Acid, Venous: 1.6 mmol/L (ref 0.5–2.0)

## 2014-07-30 LAB — CBC
HCT: 33.6 % — ABNORMAL LOW (ref 39.0–52.0)
HEMOGLOBIN: 11.9 g/dL — AB (ref 13.0–17.0)
MCH: 36.1 pg — AB (ref 26.0–34.0)
MCHC: 35.4 g/dL (ref 30.0–36.0)
MCV: 101.8 fL — ABNORMAL HIGH (ref 78.0–100.0)
Platelets: 75 10*3/uL — ABNORMAL LOW (ref 150–400)
RBC: 3.3 MIL/uL — AB (ref 4.22–5.81)
RDW: 15.5 % (ref 11.5–15.5)
WBC: 7.7 10*3/uL (ref 4.0–10.5)

## 2014-07-30 SURGERY — COLONOSCOPY
Anesthesia: Moderate Sedation

## 2014-07-30 MED ORDER — THIAMINE HCL 100 MG PO TABS: 100.0000 mg | ORAL_TABLET | Freq: Every day | ORAL | Status: DC

## 2014-07-30 MED ORDER — TRAMADOL HCL 50 MG PO TABS: 50.0000 mg | ORAL_TABLET | Freq: Three times a day (TID) | ORAL | Status: DC | PRN

## 2014-07-30 MED ORDER — FOLIC ACID 1 MG PO TABS: 1.0000 mg | ORAL_TABLET | Freq: Every day | ORAL | Status: DC

## 2014-07-30 NOTE — Progress Notes (Signed)
Appointments made for pt.  Discharge instructions reviewed with pt and prescriptions given.  Pt verbalized understanding and had no questions.  Pt discharged in stable condition via wheelchair with family.  [name]
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
## 2014-07-31 ENCOUNTER — Encounter (INDEPENDENT_AMBULATORY_CARE_PROVIDER_SITE_OTHER): Payer: Self-pay | Admitting: Internal Medicine

## 2014-08-08 ENCOUNTER — Encounter: Payer: Self-pay | Admitting: Family Medicine

## 2014-08-08 ENCOUNTER — Ambulatory Visit (INDEPENDENT_AMBULATORY_CARE_PROVIDER_SITE_OTHER): Payer: Medicare Other | Admitting: Family Medicine

## 2014-08-08 ENCOUNTER — Ambulatory Visit: Payer: Medicare Other | Admitting: Family Medicine

## 2014-08-08 VITALS — BP 144/86 | HR 88 | Temp 98.3°F | Resp 16 | Ht 74.0 in | Wt 201.0 lb

## 2014-08-08 DIAGNOSIS — F1010 Alcohol abuse, uncomplicated: Secondary | ICD-10-CM | POA: Diagnosis not present

## 2014-08-08 DIAGNOSIS — S22089G Unspecified fracture of T11-T12 vertebra, subsequent encounter for fracture with delayed healing: Secondary | ICD-10-CM

## 2014-08-08 DIAGNOSIS — R7989 Other specified abnormal findings of blood chemistry: Secondary | ICD-10-CM

## 2014-08-08 DIAGNOSIS — I10 Essential (primary) hypertension: Secondary | ICD-10-CM | POA: Diagnosis not present

## 2014-08-08 DIAGNOSIS — R945 Abnormal results of liver function studies: Secondary | ICD-10-CM

## 2014-08-08 MED ORDER — TRAMADOL HCL 50 MG PO TABS: 50.0000 mg | ORAL_TABLET | Freq: Four times a day (QID) | ORAL | Status: DC | PRN

## 2014-08-08 MED ORDER — FLUOXETINE HCL 40 MG PO CAPS: 40.0000 mg | ORAL_CAPSULE | Freq: Every day | ORAL | Status: DC

## 2014-08-08 MED ORDER — LISINOPRIL-HYDROCHLOROTHIAZIDE 10-12.5 MG PO TABS: 1.0000 | ORAL_TABLET | Freq: Every day | ORAL | Status: DC

## 2014-08-08 MED ORDER — PROPRANOLOL HCL 10 MG PO TABS: 10.0000 mg | ORAL_TABLET | Freq: Two times a day (BID) | ORAL | Status: DC

## 2014-08-08 NOTE — Assessment & Plan Note (Signed)
I had another long discussion with him about his alcohol use. He does not want to seek intervention at this time we discussed AA or putting him in a detox program inpatient he declined both of these. States he can do it on his own

## 2014-08-08 NOTE — Patient Instructions (Addendum)
We will call with lab results Take the tramadol for pain Follow-up with Dr. Ronnald Ramp for your back  Continue current medication Prozac increased to help your nerves F/U 3 months

## 2014-08-08 NOTE — Assessment & Plan Note (Signed)
Service management per the charts. He has follow-up with his neurosurgeon. I provided him with another prescription of tramadol but I'm not prescribing anything higher than this.

## 2014-08-08 NOTE — Assessment & Plan Note (Signed)
He had hypotension while he was admitted however now back at his baseline because of his alcohol use and a fluctuation with his blood pressures him and continue him on the current medications lisinopril hydrochlorothiazide as well as propanolol
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
## 2014-08-09 LAB — CBC WITH DIFFERENTIAL/PLATELET
BASOS PCT: 1 % (ref 0–1)
Basophils Absolute: 0.1 10*3/uL (ref 0.0–0.1)
EOS PCT: 1 % (ref 0–5)
Eosinophils Absolute: 0.1 10*3/uL (ref 0.0–0.7)
HEMATOCRIT: 37.3 % — AB (ref 39.0–52.0)
Hemoglobin: 12.7 g/dL — ABNORMAL LOW (ref 13.0–17.0)
LYMPHS PCT: 12 % (ref 12–46)
Lymphs Abs: 1.3 10*3/uL (ref 0.7–4.0)
MCH: 35.2 pg — ABNORMAL HIGH (ref 26.0–34.0)
MCHC: 34 g/dL (ref 30.0–36.0)
MCV: 103.3 fL — ABNORMAL HIGH (ref 78.0–100.0)
MPV: 11 fL (ref 8.6–12.4)
Monocytes Absolute: 1.6 10*3/uL — ABNORMAL HIGH (ref 0.1–1.0)
Monocytes Relative: 14 % — ABNORMAL HIGH (ref 3–12)
NEUTROS ABS: 8 10*3/uL — AB (ref 1.7–7.7)
Neutrophils Relative %: 72 % (ref 43–77)
Platelets: 140 10*3/uL — ABNORMAL LOW (ref 150–400)
RBC: 3.61 MIL/uL — AB (ref 4.22–5.81)
RDW: 16.1 % — AB (ref 11.5–15.5)
WBC: 11.1 10*3/uL — ABNORMAL HIGH (ref 4.0–10.5)

## 2014-08-09 LAB — COMPREHENSIVE METABOLIC PANEL
ALBUMIN: 3.4 g/dL — AB (ref 3.5–5.2)
ALT: 33 U/L (ref 0–53)
AST: 69 U/L — AB (ref 0–37)
Alkaline Phosphatase: 162 U/L — ABNORMAL HIGH (ref 39–117)
BILIRUBIN TOTAL: 2.2 mg/dL — AB (ref 0.2–1.2)
BUN: 12 mg/dL (ref 6–23)
CO2: 25 meq/L (ref 19–32)
CREATININE: 0.78 mg/dL (ref 0.50–1.35)
Calcium: 9.6 mg/dL (ref 8.4–10.5)
Chloride: 99 mEq/L (ref 96–112)
GLUCOSE: 161 mg/dL — AB (ref 70–99)
Potassium: 4.1 mEq/L (ref 3.5–5.3)
Sodium: 140 mEq/L (ref 135–145)
Total Protein: 7.7 g/dL (ref 6.0–8.3)

## 2014-08-13 ENCOUNTER — Emergency Department (HOSPITAL_COMMUNITY)
Admission: EM | Admit: 2014-08-13 | Discharge: 2014-08-14 | Disposition: A | Discharge status: Home / Self Care | Payer: Medicare Other | Attending: Emergency Medicine | Admitting: Emergency Medicine

## 2014-08-13 ENCOUNTER — Encounter (HOSPITAL_COMMUNITY): Payer: Self-pay | Admitting: Emergency Medicine

## 2014-08-13 ENCOUNTER — Encounter: Payer: Self-pay | Admitting: *Deleted

## 2014-08-13 DIAGNOSIS — I10 Essential (primary) hypertension: Secondary | ICD-10-CM | POA: Diagnosis not present

## 2014-08-13 DIAGNOSIS — K920 Hematemesis: Secondary | ICD-10-CM | POA: Insufficient documentation

## 2014-08-13 DIAGNOSIS — F419 Anxiety disorder, unspecified: Secondary | ICD-10-CM | POA: Diagnosis not present

## 2014-08-13 DIAGNOSIS — Z79899 Other long term (current) drug therapy: Secondary | ICD-10-CM | POA: Insufficient documentation

## 2014-08-13 DIAGNOSIS — G8929 Other chronic pain: Secondary | ICD-10-CM | POA: Insufficient documentation

## 2014-08-13 DIAGNOSIS — Z9889 Other specified postprocedural states: Secondary | ICD-10-CM | POA: Diagnosis not present

## 2014-08-13 DIAGNOSIS — Z7982 Long term (current) use of aspirin: Secondary | ICD-10-CM | POA: Insufficient documentation

## 2014-08-13 DIAGNOSIS — F329 Major depressive disorder, single episode, unspecified: Secondary | ICD-10-CM | POA: Diagnosis not present

## 2014-08-13 DIAGNOSIS — E785 Hyperlipidemia, unspecified: Secondary | ICD-10-CM | POA: Insufficient documentation

## 2014-08-13 LAB — BASIC METABOLIC PANEL
ANION GAP: 10 (ref 5–15)
BUN: 7 mg/dL (ref 6–20)
CO2: 24 mmol/L (ref 22–32)
Calcium: 8.2 mg/dL — ABNORMAL LOW (ref 8.9–10.3)
Chloride: 107 mmol/L (ref 101–111)
Creatinine, Ser: 0.92 mg/dL (ref 0.61–1.24)
GFR calc Af Amer: >60 mL/min (ref >60)
GFR calc non Af Amer: >60 mL/min (ref >60)
GLUCOSE: 125 mg/dL — AB (ref 65–99)
POTASSIUM: 3.4 mmol/L — AB (ref 3.5–5.1)
SODIUM: 141 mmol/L (ref 135–145)

## 2014-08-13 LAB — CBC WITH DIFFERENTIAL/PLATELET
BASOS ABS: 0 10*3/uL (ref 0.0–0.1)
Basophils Relative: 1 % (ref 0–1)
Eosinophils Absolute: 0.2 10*3/uL (ref 0.0–0.7)
Eosinophils Relative: 2 % (ref 0–5)
HCT: 35.6 % — ABNORMAL LOW (ref 39.0–52.0)
Hemoglobin: 12.3 g/dL — ABNORMAL LOW (ref 13.0–17.0)
Lymphocytes Relative: 22 % (ref 12–46)
Lymphs Abs: 1.9 10*3/uL (ref 0.7–4.0)
MCH: 36.2 pg — ABNORMAL HIGH (ref 26.0–34.0)
MCHC: 34.6 g/dL (ref 30.0–36.0)
MCV: 104.7 fL — ABNORMAL HIGH (ref 78.0–100.0)
MONO ABS: 0.5 10*3/uL (ref 0.1–1.0)
Monocytes Relative: 6 % (ref 3–12)
NEUTROS ABS: 6 10*3/uL (ref 1.7–7.7)
Neutrophils Relative %: 69 % (ref 43–77)
PLATELETS: 103 10*3/uL — AB (ref 150–400)
RBC: 3.4 MIL/uL — ABNORMAL LOW (ref 4.22–5.81)
RDW: 15.7 % — AB (ref 11.5–15.5)
Smear Review: DECREASED
WBC: 8.7 10*3/uL (ref 4.0–10.5)

## 2014-08-13 NOTE — ED Notes (Signed)
Pt states he drank a six pack of alcohol.

## 2014-08-13 NOTE — ED Notes (Signed)
Pt c/o vomiting up blood that started this afternoon.

## 2014-08-13 NOTE — ED Notes (Signed)
Pt tolerating fluids without difficulty.  

## 2014-08-13 NOTE — ED Notes (Addendum)
Pt reports vomiting blood and water mixture today. Per parent, last about of emesis was about 45 minutes ago.  Pt does report back pain as well.   Pt reports drinking aprox a six pack tonight.

## 2014-08-13 NOTE — ED Notes (Signed)
POC occult blood result, negative.

## 2014-08-13 NOTE — ED Provider Notes (Signed)
CSN: [id number]     Arrival date & time 08/13/14  2115 History  This chart was scribed for Ripley Fraise, MD by Chester Holstein, ED Scribe. This patient was seen in room APA03/APA03 and the patient's care was started at 10:27 PM.    Chief Complaint  Patient presents with  . Hematemesis      Patient is a 54 y.o. male presenting with vomiting. The history is provided by the patient. No language interpreter was used.  Emesis Severity:  Mild Duration:  2 hours Timing:  Sporadic Number of daily episodes:  1 Quality:  Bright red blood Able to tolerate:  Liquids and solids Progression:  Unchanged Chronicity:  New Context: not post-tussive and not self-induced   Relieved by:  Nothing Worsened by:  Nothing tried Ineffective treatments:  None tried Associated symptoms: no abdominal pain, no chills and no fever   Risk factors: alcohol use    HPI Comments: [name] is a 54 y.o. male with PMHx of HTN, CVA, DDD, HLD and EtOH abuse who presents to the Emergency Department complaining of hematemesis once with onset just PTA. He notes associated epistaxis this morning which has resolved. He states emesis was mostly blood mixed in with water. Pt also reports back pain. Pt with healing T12 fracture, however reports he has not worn his back brace today. Pt notes EtOH use approximately 1 6-pack. Pt is on BP medication. Pt denies fever, chills, abdominal pain, chest pain, hematochezia, and melena.   Past Medical History  Diagnosis Date  . Hypertension   . Stroke 2003  . Chronic back pain   . DDD (degenerative disc disease), lumbosacral   . Hx of cardiac cath 1996  . Hyperlipidemia   . History of alcohol abuse   . Anxiety   . Depression   . Elevated LFTs     secondary to ETOH   Past Surgical History  Procedure Laterality Date  . Cardiac catheterization  1996   Family History  Problem Relation Age of Onset  . Heart disease Mother   . Cancer Father     throat cancer  . Cancer  Brother     melanoma  . Thyroid disease Sister    History  Substance Use Topics  . Smoking status: Never Smoker   . Smokeless tobacco: Former Systems developer    Types: Chew  . Alcohol Use: 0.0 oz/week    0 Standard drinks or equivalent per week     Comment: ocassionally    Review of Systems  Constitutional: Negative for chills.  Cardiovascular: Negative for chest pain.  Gastrointestinal: Positive for vomiting. Negative for abdominal pain and blood in stool.  All other systems reviewed and are negative.     Allergies  Bee venom and Bee venom  Home Medications   Prior to Admission medications   Medication Sig Start Date End Date Taking? Authorizing Provider  aspirin EC 325 MG tablet Take 325 mg by mouth every evening.    Historical Provider, MD  cyclobenzaprine (FLEXERIL) 10 MG tablet Take 1 tablet (10 mg total) by mouth 2 (two) times daily as needed. 04/08/14   [name], MD  EPINEPHrine 0.3 mg/0.3 mL IJ SOAJ injection Inject 0.3 mLs (0.3 mg total) into the muscle once. 04/08/14   [name], MD  FLUoxetine (PROZAC) 40 MG capsule Take 1 capsule (40 mg total) by mouth at bedtime. 08/08/14   [name], MD  folic acid (FOLVITE) 1 MG tablet Take 1 tablet (1  mg total) by mouth daily. 07/30/14   Annita Brod, MD  lisinopril-hydrochlorothiazide (PRINZIDE,ZESTORETIC) 10-12.5 MG per tablet Take 1 tablet by mouth daily. 08/08/14   [name], MD  Multiple Vitamin (MULTIVITAMIN WITH MINERALS) TABS tablet Take 1 tablet by mouth daily. Centrum    Historical Provider, MD  polyethylene glycol-electrolytes (NULYTELY/GOLYTELY) 420 G solution Take 4,000 mLs by mouth once. Patient not taking: Reported on 08/08/2014 06/26/14   Butch Penny, NP  propranolol (INDERAL) 10 MG tablet Take 1 tablet (10 mg total) by mouth 2 (two) times daily. 08/08/14   [name], MD  Tetrahydrozoline HCl (VISINE OP) Place 1 drop into both eyes daily as needed (dry eyes).    Historical Provider, MD   thiamine 100 MG tablet Take 1 tablet (100 mg total) by mouth daily. 07/30/14   Annita Brod, MD  traMADol (ULTRAM) 50 MG tablet Take 1 tablet (50 mg total) by mouth every 6 (six) hours as needed. 08/08/14   [name], MD   BP 119/75 mmHg  Pulse 96  Temp(Src) 97.9 F (36.6 C) (Oral)  Resp 16  Ht 6\' 2"  (1.88 m)  Wt 210 lb (95.255 kg)  BMI 26.95 kg/m2  SpO2 94% Physical Exam  Nursing note and vitals reviewed.  CONSTITUTIONAL: Well developed/well nourished HEAD: Normocephalic/atraumatic EYES: EOMI/PERRL ENMT: Mucous membranes moist; no blood in either nare; no blood in mouth NECK: supple no meningeal signs SPINE/BACK:entire spine nontender CV: S1/S2 noted, no murmurs/rubs/gallops noted LUNGS: Lungs are clear to auscultation bilaterally, no apparent distress ABDOMEN: soft, nontender, no rebound or guarding, bowel sounds noted throughout abdomen GU:no cva tenderness RECTAL: no blood or melena noted heme negative NEURO: Pt is awake/alert/appropriate, moves all extremitiesx4.  No facial droop.   EXTREMITIES: pulses normal/equal, full ROM SKIN: warm, color normal PSYCH: no abnormalities of mood noted, alert and oriented to situation  Chaperone present during exam  ED Course  Procedures  DIAGNOSTIC STUDIES: Oxygen Saturation is 94% on room air, adequate by my interpretation.    COORDINATION OF CARE: 10:32 PM Discussed treatment plan with patient at beside, the patient agrees with the plan and has no further questions at this time.  10:45 PM Per records, no recent EGD He has had colonoscopy previously He has h/o ETOH abuse He is not on anticoagulants 11:57 PM Pt is a very poor historian He now reports he had an episode of black stool earlier in the day and later had 2 episodes of hematemesis On my exam no melena and hemoccult negative Initial labs unremarkable, though mild thrombocytopenia noted He does have h/o liver dysfunction but not known if he has  ulcers/varices.  He denies NSAID abuse Given intoxication and poor historian will monitor in ED, encourage PO fluids and recheck CBC At signout to dr Dina Rich, f/u on CBC at [place].  If unchanged can go home and f/u with GI  Labs Review Labs Reviewed  CBC WITH DIFFERENTIAL/PLATELET - Abnormal; Notable for the following:    RBC 3.40 (*)    Hemoglobin 12.3 (*)    HCT 35.6 (*)    MCV 104.7 (*)    MCH 36.2 (*)    RDW 15.7 (*)    Platelets 103 (*)    All other components within normal limits  BASIC METABOLIC PANEL - Abnormal; Notable for the following:    Potassium 3.4 (*)    Glucose, Bld 125 (*)    Calcium 8.2 (*)    All other components within normal limits  POC OCCULT BLOOD, ED     MDM   Final diagnoses:  Hematemesis with nausea    Nursing notes including past medical history and social history reviewed and considered in documentation Labs/vital reviewed myself and considered during evaluation   I personally performed the services described in this documentation, which was scribed in my presence. The recorded information has been reviewed and is accurate.       Ripley Fraise, MD 08/14/14 0000

## 2014-08-14 ENCOUNTER — Encounter: Payer: Self-pay | Admitting: *Deleted

## 2014-08-14 DIAGNOSIS — K920 Hematemesis: Secondary | ICD-10-CM | POA: Diagnosis not present

## 2014-08-14 LAB — CBC
HCT: 39.1 % (ref 39.0–52.0)
Hemoglobin: 13.5 g/dL (ref 13.0–17.0)
MCH: 36.3 pg — AB (ref 26.0–34.0)
MCHC: 34.5 g/dL (ref 30.0–36.0)
MCV: 105.1 fL — AB (ref 78.0–100.0)
Platelets: 130 10*3/uL — ABNORMAL LOW (ref 150–400)
RBC: 3.72 MIL/uL — ABNORMAL LOW (ref 4.22–5.81)
RDW: 15.6 % — AB (ref 11.5–15.5)
WBC: 10.4 10*3/uL (ref 4.0–10.5)

## 2014-08-14 LAB — POC OCCULT BLOOD, ED: FECAL OCCULT BLD: NEGATIVE

## 2014-08-14 NOTE — Discharge Instructions (Signed)

## 2014-08-14 NOTE — ED Notes (Signed)
Pt did not want to wait for results of lab work or to discuss with physician.  Pt left department with parent, states that he has been here long enough and is angry that we're unable to tell him what is wrong with him.

## 2014-08-27 ENCOUNTER — Ambulatory Visit: Payer: Medicare Other | Admitting: Nurse Practitioner

## 2014-08-28 ENCOUNTER — Encounter: Payer: Self-pay | Admitting: Nurse Practitioner

## 2014-09-03 ENCOUNTER — Inpatient Hospital Stay: Payer: Medicare Other | Admitting: Family Medicine

## 2014-09-18 ENCOUNTER — Ambulatory Visit (INDEPENDENT_AMBULATORY_CARE_PROVIDER_SITE_OTHER): Payer: Medicare Other | Admitting: Internal Medicine

## 2014-10-03 ENCOUNTER — Telehealth: Payer: Self-pay | Admitting: Family Medicine

## 2014-10-03 DIAGNOSIS — M79602 Pain in left arm: Secondary | ICD-10-CM

## 2014-10-03 DIAGNOSIS — R202 Paresthesia of skin: Secondary | ICD-10-CM

## 2014-10-03 DIAGNOSIS — M79601 Pain in right arm: Secondary | ICD-10-CM

## 2014-10-03 DIAGNOSIS — R251 Tremor, unspecified: Secondary | ICD-10-CM

## 2014-10-03 NOTE — Telephone Encounter (Signed)
To MD

## 2014-10-03 NOTE — Telephone Encounter (Signed)
Okay to send referral- Tremor and paresthesia Upper erxt- send Neurology notes with this in chart

## 2014-10-03 NOTE — Telephone Encounter (Signed)
Patient is requesting a new neurologist if possible  She would like to go dr Merlene Laughter in [place]  [phone]

## 2014-10-03 NOTE — Telephone Encounter (Signed)
Referral orders placed

## 2014-10-15 ENCOUNTER — Encounter (INDEPENDENT_AMBULATORY_CARE_PROVIDER_SITE_OTHER): Payer: Self-pay | Admitting: *Deleted

## 2014-11-11 ENCOUNTER — Ambulatory Visit: Payer: Medicare Other | Admitting: Family Medicine

## 2014-12-24 ENCOUNTER — Encounter: Payer: Self-pay | Admitting: Family Medicine

## 2014-12-24 ENCOUNTER — Ambulatory Visit (INDEPENDENT_AMBULATORY_CARE_PROVIDER_SITE_OTHER): Payer: Medicare Other | Admitting: Family Medicine

## 2014-12-24 VITALS — BP 140/78 | HR 66 | Temp 97.9°F | Resp 14 | Ht 74.0 in | Wt 214.0 lb

## 2014-12-24 DIAGNOSIS — F329 Major depressive disorder, single episode, unspecified: Secondary | ICD-10-CM

## 2014-12-24 DIAGNOSIS — F418 Other specified anxiety disorders: Secondary | ICD-10-CM | POA: Diagnosis not present

## 2014-12-24 DIAGNOSIS — F32A Depression, unspecified: Secondary | ICD-10-CM

## 2014-12-24 DIAGNOSIS — R7302 Impaired glucose tolerance (oral): Secondary | ICD-10-CM | POA: Diagnosis not present

## 2014-12-24 DIAGNOSIS — Z23 Encounter for immunization: Secondary | ICD-10-CM | POA: Diagnosis not present

## 2014-12-24 DIAGNOSIS — R188 Other ascites: Secondary | ICD-10-CM | POA: Insufficient documentation

## 2014-12-24 DIAGNOSIS — I10 Essential (primary) hypertension: Secondary | ICD-10-CM | POA: Diagnosis not present

## 2014-12-24 DIAGNOSIS — K7030 Alcoholic cirrhosis of liver without ascites: Secondary | ICD-10-CM | POA: Diagnosis not present

## 2014-12-24 DIAGNOSIS — M5137 Other intervertebral disc degeneration, lumbosacral region: Secondary | ICD-10-CM | POA: Diagnosis not present

## 2014-12-24 DIAGNOSIS — F419 Anxiety disorder, unspecified: Secondary | ICD-10-CM

## 2014-12-24 DIAGNOSIS — S22088G Other fracture of T11-T12 vertebra, subsequent encounter for fracture with delayed healing: Secondary | ICD-10-CM

## 2014-12-24 DIAGNOSIS — K7460 Unspecified cirrhosis of liver: Secondary | ICD-10-CM | POA: Insufficient documentation

## 2014-12-24 DIAGNOSIS — F1010 Alcohol abuse, uncomplicated: Secondary | ICD-10-CM

## 2014-12-24 MED ORDER — LISINOPRIL-HYDROCHLOROTHIAZIDE 10-12.5 MG PO TABS: 1.0000 | ORAL_TABLET | Freq: Every day | ORAL | Status: DC

## 2014-12-24 MED ORDER — FLUOXETINE HCL 40 MG PO CAPS: 40.0000 mg | ORAL_CAPSULE | Freq: Every day | ORAL | Status: DC

## 2014-12-24 MED ORDER — PROPRANOLOL HCL 10 MG PO TABS: 10.0000 mg | ORAL_TABLET | Freq: Two times a day (BID) | ORAL | Status: DC

## 2014-12-24 MED ORDER — CYCLOBENZAPRINE HCL 10 MG PO TABS: 10.0000 mg | ORAL_TABLET | Freq: Two times a day (BID) | ORAL | Status: DC | PRN

## 2014-12-24 NOTE — Assessment & Plan Note (Signed)
Referral to GI, CT showed early varices as well Recheck LFT

## 2014-12-24 NOTE — Assessment & Plan Note (Signed)
BP looks okay, unclear if he is taking meds as all bottles empty and various fill dates

## 2014-12-24 NOTE — Patient Instructions (Signed)
We will check into the viagra Continue prozac Referral to therapist FLu shot given Referral to stomach doctor Referral to back specialist F/U 4 months
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
## 2014-12-24 NOTE — Assessment & Plan Note (Signed)
MDD and anxiety, intermixed with ETOH abuse. He is taking prozac, I will not refill xanax with his ETOH abuse and he was getting from previous GF. Will send to psychiatry for further treatment for this and substance abuse. He declines AA

## 2014-12-24 NOTE — Assessment & Plan Note (Signed)
Based on nature , I would expected this to be healed, but he does have underlying DDD as well. Will see if he can get re-evaluated by Neurosurgery.  I am not going to dispense pain meds or xanax. Concern about abuse, not clear if he is off ETOH

## 2014-12-25 LAB — COMPREHENSIVE METABOLIC PANEL
ALT: 23 U/L (ref 9–46)
AST: 55 U/L — AB (ref 10–35)
Albumin: 3.2 g/dL — ABNORMAL LOW (ref 3.6–5.1)
Alkaline Phosphatase: 159 U/L — ABNORMAL HIGH (ref 40–115)
BILIRUBIN TOTAL: 1.9 mg/dL — AB (ref 0.2–1.2)
BUN: 13 mg/dL (ref 7–25)
CO2: 27 mmol/L (ref 20–31)
CREATININE: 0.66 mg/dL — AB (ref 0.70–1.33)
Calcium: 9.2 mg/dL (ref 8.6–10.3)
Chloride: 102 mmol/L (ref 98–110)
Glucose, Bld: 89 mg/dL (ref 70–99)
Potassium: 5.1 mmol/L (ref 3.5–5.3)
SODIUM: 135 mmol/L (ref 135–146)
TOTAL PROTEIN: 8 g/dL (ref 6.1–8.1)

## 2014-12-25 LAB — CBC WITH DIFFERENTIAL/PLATELET
BASOS PCT: 0 % (ref 0–1)
Basophils Absolute: 0 10*3/uL (ref 0.0–0.1)
Eosinophils Absolute: 0.3 10*3/uL (ref 0.0–0.7)
Eosinophils Relative: 4 % (ref 0–5)
HCT: 36.3 % — ABNORMAL LOW (ref 39.0–52.0)
Hemoglobin: 12.9 g/dL — ABNORMAL LOW (ref 13.0–17.0)
Lymphocytes Relative: 19 % (ref 12–46)
Lymphs Abs: 1.3 10*3/uL (ref 0.7–4.0)
MCH: 36.2 pg — ABNORMAL HIGH (ref 26.0–34.0)
MCHC: 35.5 g/dL (ref 30.0–36.0)
MCV: 102 fL — ABNORMAL HIGH (ref 78.0–100.0)
MONOS PCT: 11 % (ref 3–12)
MPV: 11 fL (ref 8.6–12.4)
Monocytes Absolute: 0.7 10*3/uL (ref 0.1–1.0)
NEUTROS ABS: 4.5 10*3/uL (ref 1.7–7.7)
Neutrophils Relative %: 66 % (ref 43–77)
Platelets: 120 10*3/uL — ABNORMAL LOW (ref 150–400)
RBC: 3.56 MIL/uL — AB (ref 4.22–5.81)
RDW: 16.6 % — ABNORMAL HIGH (ref 11.5–15.5)
WBC: 6.8 10*3/uL (ref 4.0–10.5)

## 2014-12-25 LAB — HEMOGLOBIN A1C
HEMOGLOBIN A1C: 5.6 % (ref <5.7)
MEAN PLASMA GLUCOSE: 114 mg/dL (ref <117)

## 2015-01-12 ENCOUNTER — Encounter (INDEPENDENT_AMBULATORY_CARE_PROVIDER_SITE_OTHER): Payer: Self-pay | Admitting: *Deleted

## 2015-02-17 ENCOUNTER — Telehealth (INDEPENDENT_AMBULATORY_CARE_PROVIDER_SITE_OTHER): Payer: Self-pay | Admitting: *Deleted

## 2015-02-17 ENCOUNTER — Encounter (INDEPENDENT_AMBULATORY_CARE_PROVIDER_SITE_OTHER): Payer: Self-pay | Admitting: Internal Medicine

## 2015-02-17 ENCOUNTER — Encounter (INDEPENDENT_AMBULATORY_CARE_PROVIDER_SITE_OTHER): Payer: Self-pay | Admitting: *Deleted

## 2015-02-17 ENCOUNTER — Ambulatory Visit (INDEPENDENT_AMBULATORY_CARE_PROVIDER_SITE_OTHER): Payer: Medicare Other | Admitting: Internal Medicine

## 2015-02-17 VITALS — BP 100/56 | HR 72 | Temp 98.1°F | Ht 74.0 in | Wt 214.3 lb

## 2015-02-17 DIAGNOSIS — Z86010 Personal history of colonic polyps: Secondary | ICD-10-CM | POA: Diagnosis not present

## 2015-02-17 DIAGNOSIS — K7460 Unspecified cirrhosis of liver: Secondary | ICD-10-CM

## 2015-02-17 DIAGNOSIS — R748 Abnormal levels of other serum enzymes: Secondary | ICD-10-CM | POA: Diagnosis not present

## 2015-02-17 LAB — CBC WITH DIFFERENTIAL/PLATELET
Basophils Absolute: 0.1 10*3/uL (ref 0.0–0.1)
Basophils Relative: 1 % (ref 0–1)
EOS PCT: 3 % (ref 0–5)
Eosinophils Absolute: 0.2 10*3/uL (ref 0.0–0.7)
HEMATOCRIT: 37.4 % — AB (ref 39.0–52.0)
HEMOGLOBIN: 13.1 g/dL (ref 13.0–17.0)
LYMPHS PCT: 25 % (ref 12–46)
Lymphs Abs: 1.7 10*3/uL (ref 0.7–4.0)
MCH: 35.4 pg — AB (ref 26.0–34.0)
MCHC: 35 g/dL (ref 30.0–36.0)
MCV: 101.1 fL — AB (ref 78.0–100.0)
MONO ABS: 0.8 10*3/uL (ref 0.1–1.0)
MONOS PCT: 12 % (ref 3–12)
MPV: 10.6 fL (ref 8.6–12.4)
Neutro Abs: 4 10*3/uL (ref 1.7–7.7)
Neutrophils Relative %: 59 % (ref 43–77)
Platelets: 122 10*3/uL — ABNORMAL LOW (ref 150–400)
RBC: 3.7 MIL/uL — ABNORMAL LOW (ref 4.22–5.81)
RDW: 15.7 % — ABNORMAL HIGH (ref 11.5–15.5)
WBC: 6.7 10*3/uL (ref 4.0–10.5)

## 2015-02-17 MED ORDER — PEG 3350-KCL-NA BICARB-NACL 420 G PO SOLR: 4000.0000 mL | Freq: Once | ORAL | Status: DC
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
## 2015-02-17 NOTE — Patient Instructions (Signed)
Labs and [place] elast. Colonoscopy. The risks and benefits such as perforation, bleeding, and infection were reviewed with the patient and is agreeable.

## 2015-02-17 NOTE — Telephone Encounter (Signed)
Patient needs trilyte 

## 2015-02-18 ENCOUNTER — Other Ambulatory Visit (INDEPENDENT_AMBULATORY_CARE_PROVIDER_SITE_OTHER): Payer: Self-pay | Admitting: Internal Medicine

## 2015-02-18 DIAGNOSIS — Z86010 Personal history of colonic polyps: Secondary | ICD-10-CM

## 2015-02-18 LAB — HEPATIC FUNCTION PANEL
ALBUMIN: 3.2 g/dL — AB (ref 3.6–5.1)
ALT: 31 U/L (ref 9–46)
AST: 62 U/L — AB (ref 10–35)
Alkaline Phosphatase: 126 U/L — ABNORMAL HIGH (ref 40–115)
Bilirubin, Direct: 0.4 mg/dL — ABNORMAL HIGH (ref <=0.2)
Indirect Bilirubin: 0.9 mg/dL (ref 0.2–1.2)
TOTAL PROTEIN: 7.7 g/dL (ref 6.1–8.1)
Total Bilirubin: 1.3 mg/dL — ABNORMAL HIGH (ref 0.2–1.2)

## 2015-02-18 LAB — PROTIME-INR
INR: 1.35 (ref <1.50)
Prothrombin Time: 16.8 seconds — ABNORMAL HIGH (ref 11.6–15.2)

## 2015-02-18 LAB — HEPATITIS C ANTIBODY: HCV Ab: NEGATIVE

## 2015-02-25 ENCOUNTER — Telehealth (INDEPENDENT_AMBULATORY_CARE_PROVIDER_SITE_OTHER): Payer: Self-pay | Admitting: *Deleted

## 2015-02-25 ENCOUNTER — Ambulatory Visit (HOSPITAL_COMMUNITY)
Admission: RE | Admit: 2015-02-25 | Discharge: 2015-02-25 | Disposition: A | Discharge status: Home / Self Care | Payer: Medicare Other | Source: Ambulatory Visit | Attending: Internal Medicine | Admitting: Internal Medicine

## 2015-02-25 DIAGNOSIS — R748 Abnormal levels of other serum enzymes: Secondary | ICD-10-CM

## 2015-02-25 DIAGNOSIS — K7460 Unspecified cirrhosis of liver: Secondary | ICD-10-CM | POA: Insufficient documentation

## 2015-02-25 DIAGNOSIS — R7989 Other specified abnormal findings of blood chemistry: Secondary | ICD-10-CM | POA: Insufficient documentation

## 2015-02-25 DIAGNOSIS — R932 Abnormal findings on diagnostic imaging of liver and biliary tract: Secondary | ICD-10-CM | POA: Insufficient documentation

## 2015-02-25 DIAGNOSIS — K7469 Other cirrhosis of liver: Secondary | ICD-10-CM

## 2015-02-25 NOTE — Telephone Encounter (Signed)
.  Per [name] the patient will have labs in 4 weeks.

## 2015-03-09 ENCOUNTER — Other Ambulatory Visit (INDEPENDENT_AMBULATORY_CARE_PROVIDER_SITE_OTHER): Payer: Self-pay | Admitting: *Deleted

## 2015-03-09 ENCOUNTER — Encounter (INDEPENDENT_AMBULATORY_CARE_PROVIDER_SITE_OTHER): Payer: Self-pay | Admitting: *Deleted

## 2015-03-09 DIAGNOSIS — R748 Abnormal levels of other serum enzymes: Secondary | ICD-10-CM

## 2015-03-09 DIAGNOSIS — K7469 Other cirrhosis of liver: Secondary | ICD-10-CM

## 2015-03-19 ENCOUNTER — Telehealth (INDEPENDENT_AMBULATORY_CARE_PROVIDER_SITE_OTHER): Payer: Self-pay | Admitting: *Deleted

## 2015-03-19 DIAGNOSIS — Z86010 Personal history of colonic polyps: Secondary | ICD-10-CM

## 2015-03-19 MED ORDER — PEG 3350-KCL-NA BICARB-NACL 420 G PO SOLR: 4000.0000 mL | Freq: Once | ORAL | Status: DC

## 2015-03-19 NOTE — Telephone Encounter (Signed)
Patient needs trilyte 

## 2015-03-22 ENCOUNTER — Encounter (HOSPITAL_COMMUNITY): Payer: Self-pay | Admitting: *Deleted

## 2015-03-22 ENCOUNTER — Emergency Department (HOSPITAL_COMMUNITY)
Admission: EM | Admit: 2015-03-22 | Discharge: 2015-03-22 | Disposition: A | Discharge status: Home / Self Care | Payer: Medicare Other | Attending: Emergency Medicine | Admitting: Emergency Medicine

## 2015-03-22 DIAGNOSIS — F329 Major depressive disorder, single episode, unspecified: Secondary | ICD-10-CM | POA: Insufficient documentation

## 2015-03-22 DIAGNOSIS — Z8673 Personal history of transient ischemic attack (TIA), and cerebral infarction without residual deficits: Secondary | ICD-10-CM | POA: Insufficient documentation

## 2015-03-22 DIAGNOSIS — Z7982 Long term (current) use of aspirin: Secondary | ICD-10-CM | POA: Diagnosis not present

## 2015-03-22 DIAGNOSIS — Z9889 Other specified postprocedural states: Secondary | ICD-10-CM | POA: Diagnosis not present

## 2015-03-22 DIAGNOSIS — M546 Pain in thoracic spine: Secondary | ICD-10-CM | POA: Insufficient documentation

## 2015-03-22 DIAGNOSIS — F419 Anxiety disorder, unspecified: Secondary | ICD-10-CM | POA: Diagnosis not present

## 2015-03-22 DIAGNOSIS — I10 Essential (primary) hypertension: Secondary | ICD-10-CM | POA: Diagnosis not present

## 2015-03-22 DIAGNOSIS — R05 Cough: Secondary | ICD-10-CM | POA: Diagnosis present

## 2015-03-22 DIAGNOSIS — E7220 Disorder of urea cycle metabolism, unspecified: Secondary | ICD-10-CM | POA: Diagnosis not present

## 2015-03-22 DIAGNOSIS — G8929 Other chronic pain: Secondary | ICD-10-CM | POA: Insufficient documentation

## 2015-03-22 DIAGNOSIS — Z79899 Other long term (current) drug therapy: Secondary | ICD-10-CM | POA: Diagnosis not present

## 2015-03-22 LAB — CBC WITH DIFFERENTIAL/PLATELET
BASOS ABS: 0 10*3/uL (ref 0.0–0.1)
Basophils Relative: 1 %
Eosinophils Absolute: 0.2 10*3/uL (ref 0.0–0.7)
Eosinophils Relative: 3 %
HEMATOCRIT: 34.4 % — AB (ref 39.0–52.0)
HEMOGLOBIN: 11.8 g/dL — AB (ref 13.0–17.0)
LYMPHS PCT: 38 %
Lymphs Abs: 2.5 10*3/uL (ref 0.7–4.0)
MCH: 35 pg — ABNORMAL HIGH (ref 26.0–34.0)
MCHC: 34.3 g/dL (ref 30.0–36.0)
MCV: 102.1 fL — AB (ref 78.0–100.0)
MONO ABS: 0.9 10*3/uL (ref 0.1–1.0)
MONOS PCT: 14 %
NEUTROS ABS: 2.9 10*3/uL (ref 1.7–7.7)
NEUTROS PCT: 44 %
Platelets: 121 10*3/uL — ABNORMAL LOW (ref 150–400)
RBC: 3.37 MIL/uL — ABNORMAL LOW (ref 4.22–5.81)
RDW: 15.7 % — AB (ref 11.5–15.5)
WBC: 6.6 10*3/uL (ref 4.0–10.5)

## 2015-03-22 LAB — COMPREHENSIVE METABOLIC PANEL
ALK PHOS: 116 U/L (ref 38–126)
ALT: 32 U/L (ref 17–63)
AST: 66 U/L — AB (ref 15–41)
Albumin: 2.9 g/dL — ABNORMAL LOW (ref 3.5–5.0)
Anion gap: 7 (ref 5–15)
BILIRUBIN TOTAL: 1.7 mg/dL — AB (ref 0.3–1.2)
BUN: 17 mg/dL (ref 6–20)
CALCIUM: 9 mg/dL (ref 8.9–10.3)
CO2: 28 mmol/L (ref 22–32)
Chloride: 110 mmol/L (ref 101–111)
Creatinine, Ser: 1.17 mg/dL (ref 0.61–1.24)
GFR calc Af Amer: >60 mL/min (ref >60)
GLUCOSE: 116 mg/dL — AB (ref 65–99)
POTASSIUM: 4.4 mmol/L (ref 3.5–5.1)
Sodium: 145 mmol/L (ref 135–145)
TOTAL PROTEIN: 7.3 g/dL (ref 6.5–8.1)

## 2015-03-22 LAB — TYPE AND SCREEN
ABO/RH(D): O POS
Antibody Screen: NEGATIVE

## 2015-03-22 LAB — AMMONIA: Ammonia: 60 umol/L — ABNORMAL HIGH (ref 9–35)

## 2015-03-22 LAB — PROTIME-INR
INR: 1.4 (ref 0.00–1.49)
PROTHROMBIN TIME: 17.3 s — AB (ref 11.6–15.2)

## 2015-03-22 MED ORDER — LACTULOSE 10 GM/15ML PO SOLN
20.0000 g | Freq: Once | ORAL | Status: AC
  Administered 2015-03-22: 20 g via ORAL
  Filled 2015-03-22: qty 30

## 2015-03-22 MED ORDER — LACTULOSE 10 GM/15ML PO SOLN: 10.0000 g | Freq: Two times a day (BID) | ORAL | Status: DC

## 2015-03-22 NOTE — Discharge Instructions (Signed)
Cirrhosis °Cirrhosis is long-term (chronic) liver injury. The liver is your largest internal organ, and it performs many functions. The liver converts food into energy, removes toxic material from your blood, makes important proteins, and absorbs necessary vitamins from your diet. °If you have cirrhosis, it means many of your healthy liver cells have been replaced by scar tissue. This prevents blood from flowing through your liver, which makes it difficult for your liver to function. This scarring is not reversible, but treatment can prevent it from getting worse.  °CAUSES  °Hepatitis C and long-term alcohol abuse are the most common causes of cirrhosis. Other causes include: °· Nonalcoholic fatty liver disease. °· Hepatitis B infection. °· Autoimmune hepatitis. °· Diseases that cause blockage of ducts inside the liver. °· Inherited liver diseases. °· Reactions to certain long-term medicines. °· Parasitic infections. °· Long-term exposure to certain toxins. °RISK FACTORS °You may have a higher risk of cirrhosis if you: °· Have certain hepatitis viruses. °· Abuse alcohol, especially if you are male. °· Are overweight. °· Share needles. °· Have unprotected sex with someone who has hepatitis. °SYMPTOMS  °You may not have any signs and symptoms at first. Symptoms may not develop until the damage to your liver starts to get worse. Signs and symptoms of cirrhosis may include:  °· Tenderness in the right-upper part of your abdomen. °· Weakness and tiredness (fatigue). °· Loss of appetite. °· Nausea. °· Weight loss and muscle loss. °· Itchiness. °· Yellow skin and eyes (jaundice). °· Buildup of fluid in the abdomen (ascites). °· Swelling of the feet and ankles (edema). °· Appearance of tiny blood vessels under the skin. °· Mental confusion. °· Easy bruising and bleeding. °DIAGNOSIS  °Your health care provider may suspect cirrhosis based on your symptoms and medical history, especially if you have other medical conditions  or a history of alcohol abuse. Your health care provider will do a physical exam to feel your liver and check for signs of cirrhosis. Your health care provider may perform other tests, including:  °· Blood tests to check:   °¨ Whether you have hepatitis B or C.   °¨ Kidney function. °¨ Liver function. °· Imaging tests such as: °¨ MRI or CT scan to look for changes seen in advanced cirrhosis. °¨ Ultrasound to see if normal liver tissue is being replaced by scar tissue. °· A procedure using a long needle to take a sample of liver tissue (biopsy) for examination under a microscope. Liver biopsy can confirm the diagnosis of cirrhosis.   °TREATMENT  °Treatment depends on how damaged your liver is and what caused the damage. Treatment may include treating cirrhosis symptoms or treating the underlying causes of the condition to try to slow the progression of the damage. Treatment may include: °· Making lifestyle changes, such as:   °¨ Eating a healthy diet. °¨ Restricting salt intake.  °¨ Maintaining a healthy weight.   °¨ Not abusing drugs or alcohol. °· Taking medicines to: °¨ Treat liver infections or other infections. °¨ Control itching. °¨ Reduce fluid buildup. °¨ Reduce certain blood toxins. °¨ Reduce risk of bleeding from enlarged blood vessels in the stomach or esophagus (varices). °· If varices are causing bleeding problems, you may need treatment with a procedure that ties up the vessels causing them to fall off (band ligation). °· If cirrhosis is causing your liver to fail, your health care provider may recommend a liver transplant. °· Other treatments may be recommended depending on any complications of cirrhosis, such as liver-related kidney failure (hepatorenal   syndrome). °HOME CARE INSTRUCTIONS  °· Take medicines only as directed by your health care provider. Do not use drugs that are toxic to your liver. Ask your health care provider before taking any new medicines, including over-the-counter medicines.    °· Rest as needed. °· Eat a well-balanced diet. Ask your health care provider or dietitian for more information.   °· You may have to follow a low-salt diet or restrict your water intake as directed. °· Do not drink alcohol. This is especially important if you are taking acetaminophen. °· Keep all follow-up visits as directed by your health care provider. This is important. °SEEK MEDICAL CARE IF: °· You have fatigue or weakness that is getting worse. °· You develop swelling of the hands, feet, legs, or face. °· You have a fever. °· You develop loss of appetite. °· You have nausea or vomiting. °· You develop jaundice. °· You develop easy bruising or bleeding. °SEEK IMMEDIATE MEDICAL CARE IF: °· You vomit bright red blood or a material that looks like coffee grounds. °· You have blood in your stools. °· Your stools appear black and tarry. °· You become confused. °· You have chest pain or trouble breathing. °  °This information is not intended to replace advice given to you by your health care provider. Make sure you discuss any questions you have with your health care provider. °  °Document Released: 01/31/2005 Document Revised: 02/21/2014 Document Reviewed: 10/09/2013 °[employer]vier Interactive Patient Education ©2016 [employer] ° °
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
## 2015-03-23 ENCOUNTER — Other Ambulatory Visit (INDEPENDENT_AMBULATORY_CARE_PROVIDER_SITE_OTHER): Payer: Self-pay | Admitting: *Deleted

## 2015-03-23 DIAGNOSIS — K7469 Other cirrhosis of liver: Secondary | ICD-10-CM

## 2015-03-23 DIAGNOSIS — Z86010 Personal history of colonic polyps: Secondary | ICD-10-CM

## 2015-03-25 ENCOUNTER — Encounter (HOSPITAL_COMMUNITY)
Admission: RE | Disposition: A | Discharge status: Home / Self Care | Payer: Self-pay | Source: Ambulatory Visit | Attending: Internal Medicine

## 2015-03-25 ENCOUNTER — Ambulatory Visit (HOSPITAL_COMMUNITY)
Admission: RE | Admit: 2015-03-25 | Discharge: 2015-03-25 | Disposition: A | Discharge status: Home / Self Care | Payer: Medicare Other | Source: Ambulatory Visit | Attending: Internal Medicine | Admitting: Internal Medicine

## 2015-03-25 ENCOUNTER — Ambulatory Visit (HOSPITAL_COMMUNITY): Admit: 2015-03-25 | Payer: Medicare Other | Admitting: Internal Medicine

## 2015-03-25 ENCOUNTER — Encounter (HOSPITAL_COMMUNITY): Payer: Self-pay

## 2015-03-25 ENCOUNTER — Encounter (HOSPITAL_COMMUNITY): Payer: Self-pay | Admitting: *Deleted

## 2015-03-25 DIAGNOSIS — F329 Major depressive disorder, single episode, unspecified: Secondary | ICD-10-CM | POA: Insufficient documentation

## 2015-03-25 DIAGNOSIS — K2270 Barrett's esophagus without dysplasia: Secondary | ICD-10-CM | POA: Diagnosis not present

## 2015-03-25 DIAGNOSIS — K920 Hematemesis: Secondary | ICD-10-CM | POA: Insufficient documentation

## 2015-03-25 DIAGNOSIS — Z808 Family history of malignant neoplasm of other organs or systems: Secondary | ICD-10-CM | POA: Insufficient documentation

## 2015-03-25 DIAGNOSIS — K7469 Other cirrhosis of liver: Secondary | ICD-10-CM

## 2015-03-25 DIAGNOSIS — K644 Residual hemorrhoidal skin tags: Secondary | ICD-10-CM | POA: Diagnosis not present

## 2015-03-25 DIAGNOSIS — K649 Unspecified hemorrhoids: Secondary | ICD-10-CM | POA: Diagnosis not present

## 2015-03-25 DIAGNOSIS — K253 Acute gastric ulcer without hemorrhage or perforation: Secondary | ICD-10-CM

## 2015-03-25 DIAGNOSIS — Z1211 Encounter for screening for malignant neoplasm of colon: Secondary | ICD-10-CM | POA: Diagnosis not present

## 2015-03-25 DIAGNOSIS — K5730 Diverticulosis of large intestine without perforation or abscess without bleeding: Secondary | ICD-10-CM | POA: Diagnosis not present

## 2015-03-25 DIAGNOSIS — I10 Essential (primary) hypertension: Secondary | ICD-10-CM | POA: Insufficient documentation

## 2015-03-25 DIAGNOSIS — Z7982 Long term (current) use of aspirin: Secondary | ICD-10-CM | POA: Diagnosis not present

## 2015-03-25 DIAGNOSIS — Z86010 Personal history of colonic polyps: Secondary | ICD-10-CM

## 2015-03-25 DIAGNOSIS — Z8673 Personal history of transient ischemic attack (TIA), and cerebral infarction without residual deficits: Secondary | ICD-10-CM | POA: Diagnosis not present

## 2015-03-25 DIAGNOSIS — K259 Gastric ulcer, unspecified as acute or chronic, without hemorrhage or perforation: Secondary | ICD-10-CM | POA: Diagnosis not present

## 2015-03-25 DIAGNOSIS — D123 Benign neoplasm of transverse colon: Secondary | ICD-10-CM | POA: Insufficient documentation

## 2015-03-25 DIAGNOSIS — K3189 Other diseases of stomach and duodenum: Secondary | ICD-10-CM | POA: Insufficient documentation

## 2015-03-25 DIAGNOSIS — K2960 Other gastritis without bleeding: Secondary | ICD-10-CM | POA: Insufficient documentation

## 2015-03-25 DIAGNOSIS — K2980 Duodenitis without bleeding: Secondary | ICD-10-CM | POA: Insufficient documentation

## 2015-03-25 DIAGNOSIS — E785 Hyperlipidemia, unspecified: Secondary | ICD-10-CM | POA: Diagnosis not present

## 2015-03-25 DIAGNOSIS — Z79899 Other long term (current) drug therapy: Secondary | ICD-10-CM | POA: Insufficient documentation

## 2015-03-25 DIAGNOSIS — F419 Anxiety disorder, unspecified: Secondary | ICD-10-CM | POA: Diagnosis not present

## 2015-03-25 DIAGNOSIS — Q438 Other specified congenital malformations of intestine: Secondary | ICD-10-CM | POA: Insufficient documentation

## 2015-03-25 HISTORY — PX: COLONOSCOPY: SHX5424

## 2015-03-25 HISTORY — PX: ESOPHAGOGASTRODUODENOSCOPY: SHX5428

## 2015-03-25 SURGERY — COLONOSCOPY
Anesthesia: Moderate Sedation

## 2015-03-25 MED ORDER — PANTOPRAZOLE SODIUM 40 MG PO TBEC: 40.0000 mg | DELAYED_RELEASE_TABLET | Freq: Two times a day (BID) | ORAL | Status: DC

## 2015-03-25 MED ORDER — MEPERIDINE HCL 50 MG/ML IJ SOLN
INTRAMUSCULAR | Status: AC
  Filled 2015-03-25: qty 1

## 2015-03-25 MED ORDER — ASPIRIN EC 81 MG PO TBEC: 81.0000 mg | DELAYED_RELEASE_TABLET | Freq: Every day | ORAL | Status: DC

## 2015-03-25 MED ORDER — BUTAMBEN-TETRACAINE-BENZOCAINE 2-2-14 % EX AERO
INHALATION_SPRAY | CUTANEOUS | Status: DC | PRN
  Administered 2015-03-25: 2 via TOPICAL

## 2015-03-25 MED ORDER — SODIUM CHLORIDE 0.9 % IV SOLN
INTRAVENOUS | Status: DC
  Administered 2015-03-25: 1000 mL via INTRAVENOUS

## 2015-03-25 MED ORDER — MIDAZOLAM HCL 5 MG/5ML IJ SOLN
INTRAMUSCULAR | Status: DC | PRN
  Administered 2015-03-25: 1 mg via INTRAVENOUS
  Administered 2015-03-25: 3 mg via INTRAVENOUS
  Administered 2015-03-25 (×3): 2 mg via INTRAVENOUS

## 2015-03-25 MED ORDER — MIDAZOLAM HCL 5 MG/5ML IJ SOLN
INTRAMUSCULAR | Status: AC
  Filled 2015-03-25: qty 10

## 2015-03-25 MED ORDER — STERILE WATER FOR IRRIGATION IR SOLN
Status: DC | PRN
  Administered 2015-03-25: 11:00:00

## 2015-03-25 MED ORDER — MEPERIDINE HCL 50 MG/ML IJ SOLN
INTRAMUSCULAR | Status: DC | PRN
  Administered 2015-03-25 (×4): 25 mg via INTRAVENOUS

## 2015-03-25 NOTE — Op Note (Signed)
EGD PROCEDURE REPORT  PATIENT:  [name]  MR#:  [id number] Birthdate:  03-20-1960, 55 y.o., male Endoscopist:  Dr. Rogene Houston, MD Referred By:  Dr. Vic Blackbird, MD  Procedure Date: 03/25/2015  Procedure:   EGD & Colonoscopy  Indications:  Patient is 55 year old Caucasian male with history of colonic adenomas and alcoholic cirrhosis who is undergoing surveillance colonoscopy in diagnostic EGD because of history of hematemesis. He has never been screened for esophageal or gastric varices.            Informed Consent:  The risks, benefits, alternatives & imponderables which include, but are not limited to, bleeding, infection, perforation, drug reaction and potential missed lesion have been reviewed.  The potential for biopsy, lesion removal, esophageal dilation, etc. have also been discussed.  Questions have been answered.  All parties agreeable.  Please see history & physical in medical record for more information.  Medications:  Demerol 100 mg IV Versed 10 mg IV Cetacaine spray topically for oropharyngeal anesthesia  EGD  Description of procedure:  The endoscope was introduced through the mouth and advanced to the second portion of the duodenum without difficulty or limitations. The mucosal surfaces were surveyed very carefully during advancement of the scope and upon withdrawal.  Findings:  Esophagus:  Mucosa of the esophagus was normal. Single patch of salmon colored mucosa noted at distal esophagus about 8 x 15 mm. GEJ:  43 cm Stomach:  Stomach was empty and distended very well with insufflation. Folds in the proximal stomach are normal. Examination mucosa revealed diffuse mosaic pattern with patchy erythema. Anterior erosions noted along with small prepyloric ulcer about 3 x 5 mm. Pyloric channel was patent. Angularis fundus and cardia with examined by retroflex the scope. No fundal varices noted. Duodenum:  Patchy erythema edema to bulbar and post bulbar  mucosa.  Therapeutic/Diagnostic Maneuvers Performed:   Biopsy taken from salmon colored patch of mucosa at distal esophagus for routine histology.  COLONOSCOPY Description of procedure:  After a digital rectal exam was performed, that colonoscope was advanced from the anus through the rectum and colon to the area of the cecum, ileocecal valve and appendiceal orifice. The cecum was deeply intubated. These structures were well-seen and photographed for the record. From the level of the cecum and ileocecal valve, the scope was slowly and cautiously withdrawn. The mucosal surfaces were carefully surveyed utilizing scope tip to flexion to facilitate fold flattening as needed. The scope was pulled down into the rectum where a thorough exam including retroflexion was performed.  Findings:   Prep fair to satisfactory. Redundant colon. Small polyp ablated via cold biopsy from transverse colon. Scattered diverticula at descending and sigmoid colon. Hemorrhoids noted above and below the dentate line.  Therapeutic/Diagnostic Maneuvers Performed:  See above  Complications: None  Cecal Withdrawal Time:  12 minutes  EBL: Minimal  Impression:  EGD findings: Single patch of salmon colored mucosa consistent with short segment Barrett's esophagus. Biopsies taken. No evidence of esophageal or gastric varices. Portal gastropathy. Erosive gastritis and duodenitis. Small prepyloric ulcer noted.  Colonoscopy findings; Examination performed to cecum. Small polyp ablated via cold biopsy from transverse colon. Left-sided diverticulosis. Internal and external hemorrhoids.  Recommendations:  Standard instructions given. Decrease aspirin dose to 81 mg by mouth daily. H. pylori serology. Pantoprazole 40 mg by mouth twice a day.  Kijuan Gallicchio U  03/25/2015 12:41 PM  CC: Dr. Vic Blackbird, MD & Dr. Rayne Du ref. provider found

## 2015-03-25 NOTE — Discharge Instructions (Signed)
Resume aspirin on 03/29/2015.  Please note the dose reduced to 81 mg daily********* Resume other medications as before. Low salt diet. No driving for 24 hours. Physician will call with results of biopsy and blood test.  *********No more than One gram of Tylenol on as need basis for headache. *********Must not use alcohol. Colonoscopy, Care After These instructions give you information on caring for yourself after your procedure. Your doctor may also give you more specific instructions. Call your doctor if you have any problems or questions after your procedure. HOME CARE  Do not drive for 24 hours.  Do not sign important papers or use machinery for 24 hours.  You may shower.  You may go back to your usual activities, but go slower for the first 24 hours.  Take rest breaks often during the first 24 hours.  Walk around or use warm packs on your belly (abdomen) if you have belly cramping or gas.  Drink enough fluids to keep your pee (urine) clear or pale yellow.  Resume your normal diet. Avoid heavy or fried foods.  Avoid drinking alcohol for 24 hours or as told by your doctor.  Only take medicines as told by your doctor. If a tissue sample (biopsy) was taken during the procedure:   Do not take aspirin or blood thinners for 7 days, or as told by your doctor.  Do not drink alcohol for 7 days, or as told by your doctor.  Eat soft foods for the first 24 hours. GET HELP IF: You still have a small amount of blood in your poop (stool) 2-3 days after the procedure. GET HELP RIGHT AWAY IF:  You have more than a small amount of blood in your poop.  You see clumps of tissue (blood clots) in your poop.  Your belly is puffy (swollen).  You feel sick to your stomach (nauseous) or throw up (vomit).  You have a fever.  You have belly pain that gets worse and medicine does not help. MAKE SURE YOU:  Understand these instructions.  Will watch your condition.  Will get help  right away if you are not doing well or get worse.   This information is not intended to replace advice given to you by your health care provider. Make sure you discuss any questions you have with your health care provider.   Document Released: 03/05/2010 Document Revised: 02/05/2013 Document Reviewed: 10/08/2012 Elsevier Interactive Patient Education 2016 Elsevier Inc.     Low-Sodium Eating Plan Sodium raises blood pressure and causes water to be held in the body. Getting less sodium from food will help lower your blood pressure, reduce any swelling, and protect your heart, liver, and kidneys. We get sodium by adding salt (sodium chloride) to food. Most of our sodium comes from canned, boxed, and frozen foods. Restaurant foods, fast foods, and pizza are also very high in sodium. Even if you take medicine to lower your blood pressure or to reduce fluid in your body, getting less sodium from your food is important. WHAT IS MY PLAN? Most people should limit their sodium intake to 2,300 mg a day. Your health care provider recommends that you limit your sodium intake to __________ a day.  WHAT DO I NEED TO KNOW ABOUT THIS EATING PLAN? For the low-sodium eating plan, you will follow these general guidelines:  Choose foods with a % Daily Value for sodium of less than 5% (as listed on the food label).   Use salt-free seasonings or herbs instead  of table salt or sea salt.   Check with your health care provider or pharmacist before using salt substitutes.   Eat fresh foods.  Eat more vegetables and fruits.  Limit canned vegetables. If you do use them, rinse them well to decrease the sodium.   Limit cheese to 1 oz (28 g) per day.   Eat lower-sodium products, often labeled as "lower sodium" or "no salt added."  Avoid foods that contain monosodium glutamate (MSG). MSG is sometimes added to [place] food and some canned foods.  Check food labels (Nutrition Facts labels) on foods to  learn how much sodium is in one serving.  Eat more home-cooked food and less restaurant, buffet, and fast food.  When eating at a restaurant, ask that your food be prepared with less salt, or no salt if possible.  HOW DO I READ FOOD LABELS FOR SODIUM INFORMATION? The Nutrition Facts label lists the amount of sodium in one serving of the food. If you eat more than one serving, you must multiply the listed amount of sodium by the number of servings. Food labels may also identify foods as:  Sodium free--Less than 5 mg in a serving.  Very low sodium--35 mg or less in a serving.  Low sodium--140 mg or less in a serving.  Light in sodium--50% less sodium in a serving. For example, if a food that usually has 300 mg of sodium is changed to become light in sodium, it will have 150 mg of sodium.  Reduced sodium--25% less sodium in a serving. For example, if a food that usually has 400 mg of sodium is changed to reduced sodium, it will have 300 mg of sodium. WHAT FOODS CAN I EAT? Grains Low-sodium cereals, including oats, puffed wheat and rice, and shredded wheat cereals. Low-sodium crackers. Unsalted rice and pasta. Lower-sodium bread.  Vegetables Frozen or fresh vegetables. Low-sodium or reduced-sodium canned vegetables. Low-sodium or reduced-sodium tomato sauce and paste. Low-sodium or reduced-sodium tomato and vegetable juices.  Fruits Fresh, frozen, and canned fruit. Fruit juice.  Meat and Other Protein Products Low-sodium canned tuna and salmon. Fresh or frozen meat, poultry, seafood, and fish. Lamb. Unsalted nuts. Dried beans, peas, and lentils without added salt. Unsalted canned beans. Homemade soups without salt. Eggs.  Dairy Milk. Soy milk. Ricotta cheese. Low-sodium or reduced-sodium cheeses. Yogurt.  Condiments Fresh and dried herbs and spices. Salt-free seasonings. Onion and garlic powders. Low-sodium varieties of mustard and ketchup. Fresh or refrigerated horseradish. Lemon  juice.  Fats and Oils Reduced-sodium salad dressings. Unsalted butter.  Other Unsalted popcorn and pretzels.  The items listed above may not be a complete list of recommended foods or beverages. Contact your dietitian for more options. WHAT FOODS ARE NOT RECOMMENDED? Grains Instant hot cereals. Bread stuffing, pancake, and biscuit mixes. Croutons. Seasoned rice or pasta mixes. Noodle soup cups. Boxed or frozen macaroni and cheese. Self-rising flour. Regular salted crackers. Vegetables Regular canned vegetables. Regular canned tomato sauce and paste. Regular tomato and vegetable juices. Frozen vegetables in sauces. Salted [place] fries. Olives. Angie Fava. Relishes. Sauerkraut. Salsa. Meat and Other Protein Products Salted, canned, smoked, spiced, or pickled meats, seafood, or fish. Bacon, ham, sausage, hot dogs, corned beef, chipped beef, and packaged luncheon meats. Salt pork. Jerky. Pickled herring. Anchovies, regular canned tuna, and sardines. Salted nuts. Dairy Processed cheese and cheese spreads. Cheese curds. Blue cheese and cottage cheese. Buttermilk.  Condiments Onion and garlic salt, seasoned salt, table salt, and sea salt. Canned and packaged gravies. Worcestershire sauce. Tartar  sauce. Barbecue sauce. Teriyaki sauce. Soy sauce, including reduced sodium. Steak sauce. Fish sauce. Oyster sauce. Cocktail sauce. Horseradish that you find on the shelf. Regular ketchup and mustard. Meat flavorings and tenderizers. Bouillon cubes. Hot sauce. Tabasco sauce. Marinades. Taco seasonings. Relishes. Fats and Oils Regular salad dressings. Salted butter. Margarine. Ghee. Bacon fat.  Other Potato and tortilla chips. Corn chips and puffs. Salted popcorn and pretzels. Canned or dried soups. Pizza. Frozen entrees and pot pies.  The items listed above may not be a complete list of foods and beverages to avoid. Contact your dietitian for more information.   This information is not intended to  replace advice given to you by your health care provider. Make sure you discuss any questions you have with your health care provider.   Document Released: 07/23/2001 Document Revised: 02/21/2014 Document Reviewed: 12/05/2012 Elsevier Interactive Patient Education [employer].

## 2015-03-25 NOTE — H&P (Signed)
[name] is an 55 y.o. male.   Chief Complaint: Patient's here for EGD and colonoscopy. HPI: Patient is 55 year old Caucasian male with history of alcoholic cirrhosis who has not had any drink in 5 months was scheduled to undergo colonoscopy because of history of colonic adenomas. Patient was in emergency room over the weekend after episode of hematemesis. His hemoglobin was 11.8. Platelet count was 120 1K and INR was 1.4. Patient was deemed to be stable and discharge. He is therefore also undergoing EGD prior to colonoscopy. If he has large esophageal varices these will be banded. She'll denies melena or rectal bleeding.  Past Medical History  Diagnosis Date  . Hypertension   . Stroke [facility]) 2003  . Chronic back pain   . DDD (degenerative disc disease), lumbosacral   . Hx of cardiac cath 1996  . Hyperlipidemia   . History of alcohol abuse   . Anxiety   . Depression   . Elevated LFTs     secondary to ETOH    Past Surgical History  Procedure Laterality Date  . Cardiac catheterization  1996    Family History  Problem Relation Age of Onset  . Heart disease Mother   . Cancer Father     throat cancer  . Cancer Brother     melanoma  . Thyroid disease Sister    Social History:  reports that he has never smoked. He has quit using smokeless tobacco. His smokeless tobacco use included Chew. He reports that he does not drink alcohol or use illicit drugs.  Allergies:  Allergies  Allergen Reactions  . Bee Venom Anaphylaxis and Nausea Only  . Bee Venom Anaphylaxis, Nausea And Vomiting and Swelling    Throat swelling    Medications Prior to Admission  Medication Sig Dispense Refill  . polyethylene glycol-electrolytes (NULYTELY/GOLYTELY) 420 g solution Take 4,000 mLs by mouth once. 4000 mL 0  . aspirin EC 325 MG tablet Take 325 mg by mouth every evening.    . cyclobenzaprine (FLEXERIL) 10 MG tablet Take 1 tablet (10 mg total) by mouth 2 (two) times daily as needed. 45 tablet 3   . EPINEPHrine 0.3 mg/0.3 mL IJ SOAJ injection Inject 0.3 mLs (0.3 mg total) into the muscle once. 1 Device 1  . FLUoxetine (PROZAC) 40 MG capsule Take 1 capsule (40 mg total) by mouth at bedtime. 30 capsule 6  . folic acid (FOLVITE) 1 MG tablet Take 1 tablet (1 mg total) by mouth daily. (Patient not taking: Reported on 02/17/2015) 30 tablet 1  . lactulose (CHRONULAC) 10 GM/15ML solution Take 15 mLs (10 g total) by mouth 2 (two) times daily. 473 mL 0  . lisinopril-hydrochlorothiazide (PRINZIDE,ZESTORETIC) 10-12.5 MG tablet Take 1 tablet by mouth daily. 30 tablet 3  . Multiple Vitamin (MULTIVITAMIN WITH MINERALS) TABS tablet Take 1 tablet by mouth daily. Centrum    . propranolol (INDERAL) 10 MG tablet Take 1 tablet (10 mg total) by mouth 2 (two) times daily. 60 tablet 3  . Tetrahydrozoline HCl (VISINE OP) Place 1 drop into both eyes daily as needed (dry eyes).      No results found for this or any previous visit (from the past 48 hour(s)). No results found.  ROS  Blood pressure 144/81, pulse 88, temperature 99.3 F (37.4 C), temperature source Oral, resp. rate 15, SpO2 100 %. Physical Exam  Constitutional: He appears well-developed and well-nourished.  HENT:  Few remaining teeth in poor condition.  Eyes: Conjunctivae are normal. No scleral icterus.  Neck: No thyromegaly present.  Cardiovascular: Normal rate, regular rhythm and normal heart sounds.   No murmur heard. Respiratory: Effort normal and breath sounds normal.  GI:  Abdomen is full but soft and nontender without organomegaly or masses.  Musculoskeletal: He exhibits no edema.  Lymphadenopathy:    He has no cervical adenopathy.  Neurological: He is alert.  Skin: Skin is warm and dry.     Assessment/Plan Upper GI bleed. Alcoholic cirrhosis. History of colonic adenomas. EGD for diagnostic and therapeutic purposes. Surveillance colonoscopy.  Rogene Houston, MD 03/25/2015, 11:17 AM

## 2015-03-26 LAB — H. PYLORI ANTIBODY, IGG: H Pylori IgG: <0.9 U/mL (ref 0.0–0.8)

## 2015-03-27 ENCOUNTER — Encounter: Payer: Self-pay | Admitting: Family Medicine

## 2015-03-27 ENCOUNTER — Ambulatory Visit (INDEPENDENT_AMBULATORY_CARE_PROVIDER_SITE_OTHER): Payer: Medicare Other | Admitting: Family Medicine

## 2015-03-27 ENCOUNTER — Other Ambulatory Visit: Payer: Self-pay | Admitting: Family Medicine

## 2015-03-27 VITALS — BP 148/88 | HR 78 | Temp 99.3°F | Resp 16 | Ht 74.0 in | Wt 208.0 lb

## 2015-03-27 DIAGNOSIS — S22088G Other fracture of T11-T12 vertebra, subsequent encounter for fracture with delayed healing: Secondary | ICD-10-CM | POA: Diagnosis not present

## 2015-03-27 DIAGNOSIS — I10 Essential (primary) hypertension: Secondary | ICD-10-CM

## 2015-03-27 DIAGNOSIS — M5137 Other intervertebral disc degeneration, lumbosacral region: Secondary | ICD-10-CM

## 2015-03-27 DIAGNOSIS — F418 Other specified anxiety disorders: Secondary | ICD-10-CM | POA: Diagnosis not present

## 2015-03-27 DIAGNOSIS — F329 Major depressive disorder, single episode, unspecified: Secondary | ICD-10-CM

## 2015-03-27 DIAGNOSIS — F419 Anxiety disorder, unspecified: Secondary | ICD-10-CM

## 2015-03-27 DIAGNOSIS — K7030 Alcoholic cirrhosis of liver without ascites: Secondary | ICD-10-CM | POA: Diagnosis not present

## 2015-03-27 DIAGNOSIS — F32A Depression, unspecified: Secondary | ICD-10-CM

## 2015-03-27 MED ORDER — BUSPIRONE HCL 7.5 MG PO TABS: 7.5000 mg | ORAL_TABLET | Freq: Two times a day (BID) | ORAL | Status: DC

## 2015-03-27 NOTE — Assessment & Plan Note (Signed)
No meds taken, typically controlled

## 2015-03-27 NOTE — Assessment & Plan Note (Signed)
With his declining health, I would not recommend any surgery Will refer to pain clinic to see if anything else can be done to assist him Concern with narcotics and his cirrhosis

## 2015-03-27 NOTE — Assessment & Plan Note (Signed)
I will start BuSpar 7.5 mg twice a day for anxiety in addition to his Prozac. I will not prescribe in Benzo[facility].

## 2015-03-27 NOTE — Patient Instructions (Signed)
We will call with lab results Start buspar twice a day for your nerves Referral to pain clinic F/U 4 months

## 2015-03-27 NOTE — Progress Notes (Signed)
Patient ID: [name], male   DOB: 08/20/1960, 55 y.o.   MRN: [id number]    Subjective:    Patient ID: [name], male    DOB: 1960/02/25, 55 y.o.   MRN: [id number]  Patient presents for 3 month F/U   Patient here for follow-up. He wants to get her Xanax and his pain pills. Fortunately he is still drug screens and he is also problems with alcohol abuse and taking his girlfriend's medicine in the past . Her eyes decline giving him benzodiazepines and pain meds. On the other side he also has cirrhosis of the liver which is another Chronic pain medication. He was in the emergency room about a week ago with dizzy episodes and feeling like he passed out his ammonia level was elevated he was started on lactulose. He recently had EGD to evaluate his bare disease and he also had colonoscopy which didn't reveal all. He states that he has been alcohol free for the past few months. We referred him to psychiatry in neurosurgery at her last visit. He does have chronic pain from significant degenerative disc disease as well as a fracture in the spine however he states he has not heard anything back from that referral. He also did not go to the psychiatrist. I referred for further treatment disorder and substance abuse.  She is taking prozac   Review Of Systems:  GEN- denies fatigue, fever, weight loss,weakness, recent illness HEENT- denies eye drainage, change in vision, nasal discharge, CVS- denies chest pain, palpitations RESP- denies SOB, cough, wheeze ABD- denies N/V, change in stools, abd pain GU- denies dysuria, hematuria, dribbling, incontinence MSK- + joint pain, muscle aches, injury Neuro- denies headache, dizziness, syncope, seizure activity       Objective:    BP 148/88 mmHg  Pulse 78  Temp(Src) 99.3 F (37.4 C) (Oral)  Resp 16  Ht 6\' 2"  (1.88 m)  Wt 208 lb (94.348 kg)  BMI 26.69 kg/m2 GEN- NAD, alert and oriented x3, very strong [place] odor HEENT- PERRL, EOMI, non injected  sclera, pink conjunctiva, MMM, oropharynx clear CVS- RRR,  no murmur RESP-CTAB Mood- a little anxious appearing, not depressed, well groomed, normal speech EXT- No edema Pulses- Radial, 2+       Assessment & Plan:      Problem List Items Addressed This Visit    T12 vertebral fracture ([place])   Relevant Orders   Ambulatory referral to Pain Clinic   Essential hypertension    No meds taken, typically controlled       DEGENERATIVE DISC DISEASE, LUMBAR SPINE    With his declining health, I would not recommend any surgery Will refer to pain clinic to see if anything else can be done to assist him Concern with narcotics and his cirrhosis      Relevant Orders   Ambulatory referral to Pain Clinic   Cirrhosis of liver ([place]) - Primary    Reviewed GI note, recheck ammonia level, he is taking lactulose once a day       Relevant Orders   Ammonia   Anxiety and depression    I will start BuSpar 7.5 mg twice a day for anxiety in addition to his Prozac. I will not prescribe in [facility].         Note: This dictation was prepared with Dragon dictation along with smaller phrase technology. Any transcriptional errors that result from this process are unintentional.

## 2015-03-27 NOTE — Assessment & Plan Note (Signed)
Reviewed GI note, recheck ammonia level, he is taking lactulose once a day

## 2015-03-28 LAB — AMMONIA: Ammonia: 59 umol/L — ABNORMAL HIGH (ref 16–53)

## 2015-03-31 ENCOUNTER — Encounter (HOSPITAL_COMMUNITY): Payer: Self-pay | Admitting: Internal Medicine

## 2015-03-31 ENCOUNTER — Telehealth: Payer: Self-pay | Admitting: Family Medicine

## 2015-03-31 NOTE — Telephone Encounter (Signed)
-----   Message from Maureen Chatters, [place] sent at 03/31/2015  2:35 PM EST ----- Regarding: RE: Check on referrals rec'd call from Dr. Harrington Challenger office and stated that pt will need to be referred to Detox and substance abuse clinic, I placed referral to [facility].     ----- Message -----    From: [name], MD    Sent: 03/27/2015   5:08 PM      To: Maureen Chatters, CMA Subject: Check on referrals                              Pt referred to psychiatry in Nov, I don't see any appt Also referred to neurosurgery- [facility]- I dont see in any f/u

## 2015-04-01 ENCOUNTER — Telehealth: Payer: Self-pay | Admitting: *Deleted

## 2015-04-01 NOTE — Telephone Encounter (Signed)
-----   Message from [name], MD sent at 03/31/2015  3:07 PM EST ----- Regarding: RE: Check on referrals Agree with above.  ----- Message -----    From: Maureen Chatters, CMA    Sent: 03/31/2015   2:35 PM      To: [name], MD Subject: RE: Check on referrals                         rec'd call from Dr. Harrington Challenger office and stated that pt will need to be referred to Detox and substance abuse clinic, I placed referral to [facility].     ----- Message -----    From: [name], MD    Sent: 03/27/2015   5:08 PM      To: Maureen Chatters, CMA Subject: Check on referrals                              Pt referred to psychiatry in Nov, I don't see any appt Also referred to neurosurgery- [facility]- I dont see in any f/u

## 2015-04-16 ENCOUNTER — Telehealth: Payer: Self-pay | Admitting: *Deleted

## 2015-04-16 NOTE — Telephone Encounter (Signed)
pt has appt scheduled for mon march 6 at 72 with Dr. Tamera Reason at [facility], left message with pt brother to return my call

## 2015-04-17 NOTE — Telephone Encounter (Signed)
Pt called back and aware of appt but stated that he does not want to go to Rehab/Psychiatry states he is going to wean himself off Xanax and ETOH, he says he knows that is will take some time but thinks he can handle it. I tried to talk pt into going but was adament of not wanting to go.   Pt did state that he still wants to go to back doctor in which I informed him the referral has been placed but is in review and will take couple weeks before schedule is made.  Cancelling referral to Psychiatry/rehab.  Pt says THANK YOU for all you have done for him so far and that you were sweet :)

## 2015-04-17 NOTE — Telephone Encounter (Signed)
Noted. I am not prescribing xanax or pain meds

## 2015-04-22 ENCOUNTER — Encounter (HOSPITAL_COMMUNITY): Payer: Self-pay | Admitting: Emergency Medicine

## 2015-04-22 ENCOUNTER — Emergency Department (HOSPITAL_COMMUNITY): Payer: Medicare Other

## 2015-04-22 ENCOUNTER — Emergency Department (HOSPITAL_COMMUNITY)
Admission: EM | Admit: 2015-04-22 | Discharge: 2015-04-22 | Disposition: A | Discharge status: Home / Self Care | Payer: Medicare Other | Attending: Emergency Medicine | Admitting: Emergency Medicine

## 2015-04-22 DIAGNOSIS — I10 Essential (primary) hypertension: Secondary | ICD-10-CM | POA: Insufficient documentation

## 2015-04-22 DIAGNOSIS — E785 Hyperlipidemia, unspecified: Secondary | ICD-10-CM | POA: Diagnosis not present

## 2015-04-22 DIAGNOSIS — F329 Major depressive disorder, single episode, unspecified: Secondary | ICD-10-CM | POA: Insufficient documentation

## 2015-04-22 DIAGNOSIS — Z8673 Personal history of transient ischemic attack (TIA), and cerebral infarction without residual deficits: Secondary | ICD-10-CM | POA: Insufficient documentation

## 2015-04-22 DIAGNOSIS — Z7982 Long term (current) use of aspirin: Secondary | ICD-10-CM | POA: Diagnosis not present

## 2015-04-22 DIAGNOSIS — K920 Hematemesis: Secondary | ICD-10-CM | POA: Diagnosis not present

## 2015-04-22 DIAGNOSIS — Z87891 Personal history of nicotine dependence: Secondary | ICD-10-CM | POA: Insufficient documentation

## 2015-04-22 LAB — COMPREHENSIVE METABOLIC PANEL
ALBUMIN: 2.9 g/dL — AB (ref 3.5–5.0)
ALK PHOS: 110 U/L (ref 38–126)
ALT: 32 U/L (ref 17–63)
AST: 92 U/L — AB (ref 15–41)
Anion gap: 8 (ref 5–15)
BILIRUBIN TOTAL: 1.9 mg/dL — AB (ref 0.3–1.2)
BUN: 13 mg/dL (ref 6–20)
CALCIUM: 8 mg/dL — AB (ref 8.9–10.3)
CO2: 22 mmol/L (ref 22–32)
CREATININE: 1.06 mg/dL (ref 0.61–1.24)
Chloride: 101 mmol/L (ref 101–111)
GFR calc Af Amer: >60 mL/min (ref >60)
GFR calc non Af Amer: >60 mL/min (ref >60)
GLUCOSE: 106 mg/dL — AB (ref 65–99)
Potassium: 3.5 mmol/L (ref 3.5–5.1)
Sodium: 131 mmol/L — ABNORMAL LOW (ref 135–145)
TOTAL PROTEIN: 7.1 g/dL (ref 6.5–8.1)

## 2015-04-22 LAB — CBC
HCT: 34.2 % — ABNORMAL LOW (ref 39.0–52.0)
Hemoglobin: 11.8 g/dL — ABNORMAL LOW (ref 13.0–17.0)
MCH: 34.5 pg — AB (ref 26.0–34.0)
MCHC: 34.5 g/dL (ref 30.0–36.0)
MCV: 100 fL (ref 78.0–100.0)
Platelets: 69 10*3/uL — ABNORMAL LOW (ref 150–400)
RBC: 3.42 MIL/uL — ABNORMAL LOW (ref 4.22–5.81)
RDW: 16.3 % — AB (ref 11.5–15.5)
WBC: 7.7 10*3/uL (ref 4.0–10.5)

## 2015-04-22 LAB — TYPE AND SCREEN
ABO/RH(D): O POS
ANTIBODY SCREEN: NEGATIVE

## 2015-04-22 MED ORDER — SODIUM CHLORIDE 0.9 % IV BOLUS (SEPSIS)
1000.0000 mL | Freq: Once | INTRAVENOUS | Status: AC
  Administered 2015-04-22: 1000 mL via INTRAVENOUS

## 2015-04-22 MED ORDER — IBUPROFEN 400 MG PO TABS
400.0000 mg | ORAL_TABLET | Freq: Once | ORAL | Status: AC
  Administered 2015-04-22: 400 mg via ORAL
  Filled 2015-04-22: qty 1

## 2015-04-22 MED ORDER — ONDANSETRON 4 MG PO TBDP: ORAL_TABLET | ORAL | Status: DC

## 2015-04-22 MED ORDER — PANTOPRAZOLE SODIUM 40 MG IV SOLR
40.0000 mg | Freq: Once | INTRAVENOUS | Status: AC
  Administered 2015-04-22: 40 mg via INTRAVENOUS
  Filled 2015-04-22: qty 40

## 2015-04-22 MED ORDER — ONDANSETRON HCL 4 MG/2ML IJ SOLN
4.0000 mg | Freq: Once | INTRAMUSCULAR | Status: AC
  Administered 2015-04-22: 4 mg via INTRAVENOUS
  Filled 2015-04-22: qty 2

## 2015-04-22 NOTE — Discharge Instructions (Signed)
Follow-up with your family doctor next week

## 2015-04-22 NOTE — ED Notes (Signed)
Pt c/o weakness with vomiting since Saturday. Pt states the emesis is watery red.

## 2015-04-22 NOTE — ED Notes (Signed)
Pt continues to c/o mild nausea.  No vomiting since arrival to department.

## 2015-04-22 NOTE — ED Provider Notes (Addendum)
CSN: [id number]     Arrival date & time 04/22/15  1921 History  By signing my name below, I, [name], attest that this documentation has been prepared under the direction and in the presence of [name] Ferguson, MD. Electronically Signed: Doran [name], ED Scribe. 04/22/2015. 8:22 PM.    Chief Complaint  Patient presents with  . Hematemesis   Patient is a 55 y.o. male presenting with vomiting. The history is provided by the patient. No language interpreter was used.  Emesis Severity:  Moderate Duration:  4 days Timing:  Constant Number of daily episodes:  1 Quality:  Bright red blood Progression:  Unchanged Chronicity:  New Recent urination:  Normal Relieved by:  Nothing Worsened by:  Nothing tried Ineffective treatments:  None tried Associated symptoms: no abdominal pain, no diarrhea and no headaches    HPI Comments: [name] is a 55 y.o. male with a PMHx of HTN who presents to the Emergency Department complaining of hematemesis that began 4 days ago. Pt reports he was told by his GI to visit the ED if he began vomiting blood. Pt has had an episode of hematemesis daily for the past 4 days. Pts last episode was last night. Pt denies any abdominal pain or any other symptoms at this time.   Past Medical History  Diagnosis Date  . Hypertension   . Stroke [facility]) 2003  . Chronic back pain   . DDD (degenerative disc disease), lumbosacral   . Hx of cardiac cath 1996  . Hyperlipidemia   . History of alcohol abuse   . Anxiety   . Depression   . Elevated LFTs     secondary to ETOH   Past Surgical History  Procedure Laterality Date  . Cardiac catheterization  1996  . Colonoscopy N/A 03/25/2015    Procedure: COLONOSCOPY;  Surgeon: Rogene Houston, MD;  Location: [facility];  Service: Endoscopy;  Laterality: N/A;  1130  . Esophagogastroduodenoscopy N/A 03/25/2015    Procedure: ESOPHAGOGASTRODUODENOSCOPY (EGD);  Surgeon: Rogene Houston, MD;  Location: [facility];   Service: Endoscopy;  Laterality: N/A;   Family History  Problem Relation Age of Onset  . Heart disease Mother   . Cancer Father     throat cancer  . Cancer Brother     melanoma  . Thyroid disease Sister    Social History  Substance Use Topics  . Smoking status: Never Smoker   . Smokeless tobacco: Former Systems developer    Types: Chew  . Alcohol Use: No     Comment: quit - 5 months ago    Review of Systems  Constitutional: Negative for appetite change and fatigue.  HENT: Negative for congestion, ear discharge and sinus pressure.   Eyes: Negative for discharge.  Respiratory: Negative for cough.   Cardiovascular: Negative for chest pain.  Gastrointestinal: Positive for vomiting. Negative for abdominal pain and diarrhea.  Genitourinary: Negative for frequency and hematuria.  Musculoskeletal: Negative for back pain.  Skin: Negative for rash.  Neurological: Negative for seizures and headaches.  Psychiatric/Behavioral: Negative for hallucinations.   Allergies  Bee venom and Bee venom  Home Medications   Prior to Admission medications   Medication Sig Start Date End Date Taking? Authorizing Provider  aspirin EC 81 MG tablet Take 1 tablet (81 mg total) by mouth daily. 03/25/15   Rogene Houston, MD  busPIRone (BUSPAR) 7.5 MG tablet Take 1 tablet (7.5 mg total) by mouth 2 (two) times daily. 03/27/15  [name], MD  cyclobenzaprine (FLEXERIL) 10 MG tablet Take 1 tablet (10 mg total) by mouth 2 (two) times daily as needed. 12/24/14   [name], MD  EPINEPHrine 0.3 mg/0.3 mL IJ SOAJ injection Inject 0.3 mLs (0.3 mg total) into the muscle once. 04/08/14   [name], MD  FLUoxetine (PROZAC) 40 MG capsule Take 1 capsule (40 mg total) by mouth at bedtime. 12/24/14   [name], MD  folic acid (FOLVITE) 1 MG tablet Take 1 tablet (1 mg total) by mouth daily. 07/30/14   Annita Brod, MD  lactulose (CHRONULAC) 10 GM/15ML solution Take 15 mLs (10 g total) by mouth 2 (two) times  daily. 03/22/15   Orpah Greek, MD  lisinopril-hydrochlorothiazide (PRINZIDE,ZESTORETIC) 10-12.5 MG tablet Take 1 tablet by mouth daily. 12/24/14   [name], MD  Multiple Vitamin (MULTIVITAMIN WITH MINERALS) TABS tablet Take 1 tablet by mouth daily. Centrum    Historical Provider, MD  pantoprazole (PROTONIX) 40 MG tablet Take 1 tablet (40 mg total) by mouth 2 (two) times daily before a meal. 03/25/15   Rogene Houston, MD  propranolol (INDERAL) 10 MG tablet Take 1 tablet (10 mg total) by mouth 2 (two) times daily. 12/24/14   [name], MD  Tetrahydrozoline HCl (VISINE OP) Place 1 drop into both eyes daily as needed (dry eyes).    Historical Provider, MD   BP 124/61 mmHg  Pulse 74  Temp(Src) 98.6 F (37 C) (Oral)  Resp 16  Ht 6\' 3"  (1.905 m)  Wt 220 lb (99.791 kg)  BMI 27.50 kg/m2  SpO2 100%   Physical Exam  Constitutional: He is oriented to person, place, and time. He appears well-developed.  HENT:  Head: Normocephalic.  Eyes: Conjunctivae and EOM are normal. No scleral icterus.  Neck: Neck supple. No thyromegaly present.  Cardiovascular: Normal rate and regular rhythm.  Exam reveals no gallop and no friction rub.   No murmur heard. Pulmonary/Chest: No stridor. He has no wheezes. He has no rales. He exhibits no tenderness.  Abdominal: He exhibits no distension. There is no tenderness. There is no rebound.  Musculoskeletal: Normal range of motion. He exhibits no edema.  Lymphadenopathy:    He has no cervical adenopathy.  Neurological: He is oriented to person, place, and time. He exhibits normal muscle tone. Coordination normal.  Skin: No rash noted. No erythema.  Psychiatric: He has a normal mood and affect. His behavior is normal.    ED Course  Procedures   DIAGNOSTIC STUDIES: Oxygen Saturation is 100% on room air, normal by my interpretation.    COORDINATION OF CARE: 8:22 PM Will give fluids and Protonix. Will order abdominal x-ray and blood work.  Discussed treatment plan with pt at bedside and pt agreed to plan.  Labs Review  Labs Reviewed  COMPREHENSIVE METABOLIC PANEL  CBC  POC OCCULT BLOOD, ED  TYPE AND SCREEN    Imaging Review No results found. I have personally reviewed and evaluated these images and lab results as part of my medical decision-making.   EKG Interpretation None      MDM   Final diagnoses:  None    Patient with a history of vomiting blood. Status post recent upper GI which showed some ulcers. Hemoglobin has not changed in a month patient is not orthostatic he will be discharged home to follow-up with family doctor in GI  The chart was scribed for me under my direct supervision.  I personally  performed the history, physical, and medical decision making and all procedures in the evaluation of this patient.[name] Ferguson, MD 04/22/15 2325  [name] Ferguson, MD 04/22/15 2325

## 2015-04-23 LAB — POC OCCULT BLOOD, ED: Fecal Occult Bld: NEGATIVE

## 2015-05-19 ENCOUNTER — Ambulatory Visit (INDEPENDENT_AMBULATORY_CARE_PROVIDER_SITE_OTHER): Payer: Medicare Other | Admitting: Internal Medicine

## 2015-05-19 ENCOUNTER — Encounter (INDEPENDENT_AMBULATORY_CARE_PROVIDER_SITE_OTHER): Payer: Self-pay | Admitting: *Deleted

## 2015-06-11 ENCOUNTER — Telehealth: Payer: Self-pay | Admitting: *Deleted

## 2015-06-11 MED ORDER — EPINEPHRINE 0.3 MG/0.3ML IJ SOAJ: 0.3000 mg | Freq: Once | INTRAMUSCULAR | Status: DC

## 2015-06-11 NOTE — Telephone Encounter (Signed)
Received fax from insurance regarding the prescription for EpiPen.   The Epi-Pen 2 pack auto injector has been recalled due to ineffective auto injector.   MD made aware and recommendations are as follows:  Discard Epi-Pen. Begin generic alternative.   Prescription sent to pharmacy.

## 2015-07-21 ENCOUNTER — Other Ambulatory Visit: Payer: Self-pay | Admitting: Family Medicine

## 2015-07-21 NOTE — Telephone Encounter (Signed)
Okay to refill? 

## 2015-07-21 NOTE — Telephone Encounter (Signed)
Ok to refill 

## 2015-07-21 NOTE — Telephone Encounter (Signed)
Prescription sent to pharmacy.

## 2015-07-29 ENCOUNTER — Ambulatory Visit: Payer: Medicare Other | Admitting: Family Medicine

## 2015-08-03 ENCOUNTER — Ambulatory Visit: Payer: Medicare Other | Admitting: Family Medicine

## 2015-08-14 ENCOUNTER — Encounter: Payer: Self-pay | Admitting: Family Medicine

## 2015-08-14 ENCOUNTER — Ambulatory Visit (INDEPENDENT_AMBULATORY_CARE_PROVIDER_SITE_OTHER): Payer: Medicare Other | Admitting: Family Medicine

## 2015-08-14 VITALS — BP 158/82 | HR 92 | Temp 98.9°F | Resp 16 | Ht 74.0 in | Wt 222.0 lb

## 2015-08-14 DIAGNOSIS — I10 Essential (primary) hypertension: Secondary | ICD-10-CM | POA: Diagnosis not present

## 2015-08-14 DIAGNOSIS — K7030 Alcoholic cirrhosis of liver without ascites: Secondary | ICD-10-CM

## 2015-08-14 DIAGNOSIS — F1010 Alcohol abuse, uncomplicated: Secondary | ICD-10-CM

## 2015-08-14 DIAGNOSIS — N390 Urinary tract infection, site not specified: Secondary | ICD-10-CM | POA: Diagnosis not present

## 2015-08-14 DIAGNOSIS — R7302 Impaired glucose tolerance (oral): Secondary | ICD-10-CM

## 2015-08-14 LAB — AMMONIA: AMMONIA: 81 umol/L — AB (ref 16–53)

## 2015-08-14 MED ORDER — FLUOXETINE HCL 40 MG PO CAPS: 40.0000 mg | ORAL_CAPSULE | Freq: Every day | ORAL | Status: DC

## 2015-08-14 MED ORDER — PROPRANOLOL HCL 10 MG PO TABS: 10.0000 mg | ORAL_TABLET | Freq: Two times a day (BID) | ORAL | Status: DC

## 2015-08-14 MED ORDER — OMEPRAZOLE 20 MG PO CPDR: 20.0000 mg | DELAYED_RELEASE_CAPSULE | Freq: Two times a day (BID) | ORAL | Status: DC

## 2015-08-14 MED ORDER — RANITIDINE HCL 150 MG PO CAPS: 150.0000 mg | ORAL_CAPSULE | Freq: Two times a day (BID) | ORAL | Status: DC

## 2015-08-14 MED ORDER — SUCRALFATE 1 GM/10ML PO SUSP: 1.0000 g | Freq: Three times a day (TID) | ORAL | Status: DC

## 2015-08-14 MED ORDER — BUSPIRONE HCL 7.5 MG PO TABS: 7.5000 mg | ORAL_TABLET | Freq: Two times a day (BID) | ORAL | Status: DC

## 2015-08-14 NOTE — Patient Instructions (Signed)
Continue all the medications as listed Call your [phone] We will call with lab results  I recommend psychiatry and rehab  F/U 4 months

## 2015-08-14 NOTE — Progress Notes (Signed)
Patient ID: [name], male   DOB: 07-Jul-1960, 55 y.o.   MRN: [id number]    Subjective:    Patient ID: [name], male    DOB: May 14, 1960, 55 y.o.   MRN: [id number]  Patient presents for Hospital F/U Patient here for hospital follow-up. He was admitted to the hospital 618 through 620 secondary to hematemesis. He has known cirrhosis of the liver and ongoing alcohol abuse. He has been on aspirin was also taking ibuprofen. His initial hemoglobin was 12.1 at discharge 11.8 his ammonia level was initially elevated at 63 he had troponins done which were negative. Cystoscopy upper GI bleed from his cirrhosis and his alcohol abuse/NSAID use. He is given a combination of Protonix with Zantac and Carafate 4 times a day he was also given Augmentin secondary to strep in his urine. He was also given Librium for alcohol withdrawal Note he was seen in the emergency room back in March with hematemesis after alcohol use as well. Her last visit he was asking for pain medication and Xanax with his ongoing alcohol abuse and I declined this. We attempted to refer him to a pain clinic however he was not accepted due to need for psychiatric clearance. He also been referred to psychiatry but he did not keep his appointment he stated that he was to wean himself off of alcohol and Xanax which is noted in the chart back in March.  Review Of Systems:  GEN- denies fatigue, fever, weight loss,weakness, recent illness HEENT- denies eye drainage, change in vision, nasal discharge, CVS- denies chest pain, palpitations RESP- denies SOB, cough, wheeze ABD- denies N/V, change in stools,+ abd pain GU- denies dysuria, hematuria, dribbling, incontinence MSK-+ joint pain, muscle aches, injury Neuro- denies headache, dizziness, syncope, seizure activity       Objective:    BP 158/82 mmHg  Pulse 92  Temp(Src) 98.9 F (37.2 C) (Oral)  Resp 16  Ht 6\' 2"  (1.88 m)  Wt 222 lb (100.699 kg)  BMI 28.49 kg/m2 GEN- NAD,  alert and oriented x3 HEENT- PERRL, EOMI, non injected sclera, pink conjunctiva, MMM, oropharynx clear CVS- RRR, no murmur RESP-CTAB ABD-NABS,soft,NT,ND Psych- anxious appearing, tremor at baseline, not depressed appearing, nO SI  EXT- No edema Pulses- Radial  2+        Assessment & Plan:      Problem List Items Addressed This Visit    None      Note: This dictation was prepared with Dragon dictation along with smaller phrase technology. Any transcriptional errors that result from this process are unintentional.

## 2015-08-15 LAB — CBC WITH DIFFERENTIAL/PLATELET
BASOS ABS: 0 {cells}/uL (ref 0–200)
Basophils Relative: 0 %
Eosinophils Absolute: 162 cells/uL (ref 15–500)
Eosinophils Relative: 3 %
HCT: 39.5 % (ref 38.5–50.0)
Hemoglobin: 13.3 g/dL (ref 13.0–17.0)
LYMPHS ABS: 1566 {cells}/uL (ref 850–3900)
Lymphocytes Relative: 29 %
MCH: 34.8 pg — AB (ref 27.0–33.0)
MCHC: 33.7 g/dL (ref 32.0–36.0)
MCV: 103.4 fL — AB (ref 80.0–100.0)
MONOS PCT: 13 %
MPV: 10.7 fL (ref 7.5–12.5)
Monocytes Absolute: 702 cells/uL (ref 200–950)
NEUTROS ABS: 2970 {cells}/uL (ref 1500–7800)
Neutrophils Relative %: 55 %
PLATELETS: 103 10*3/uL — AB (ref 140–400)
RBC: 3.82 MIL/uL — ABNORMAL LOW (ref 4.20–5.80)
RDW: 16 % — AB (ref 11.0–15.0)
WBC: 5.4 10*3/uL (ref 3.8–10.8)

## 2015-08-15 LAB — COMPREHENSIVE METABOLIC PANEL
ALT: 23 U/L (ref 9–46)
AST: 49 U/L — ABNORMAL HIGH (ref 10–35)
Albumin: 3.5 g/dL — ABNORMAL LOW (ref 3.6–5.1)
Alkaline Phosphatase: 167 U/L — ABNORMAL HIGH (ref 40–115)
BUN: 8 mg/dL (ref 7–25)
CHLORIDE: 105 mmol/L (ref 98–110)
CO2: 24 mmol/L (ref 20–31)
Calcium: 9.2 mg/dL (ref 8.6–10.3)
Creat: 0.97 mg/dL (ref 0.70–1.33)
GLUCOSE: 143 mg/dL — AB (ref 70–99)
POTASSIUM: 3.7 mmol/L (ref 3.5–5.3)
Sodium: 141 mmol/L (ref 135–146)
TOTAL PROTEIN: 7.9 g/dL (ref 6.1–8.1)
Total Bilirubin: 2.1 mg/dL — ABNORMAL HIGH (ref 0.2–1.2)

## 2015-08-16 ENCOUNTER — Encounter: Payer: Self-pay | Admitting: Family Medicine

## 2015-08-16 NOTE — Assessment & Plan Note (Signed)
BP has been up and down, in setting of ETOH ? If taking his BP meds regulary  No changes to me made

## 2015-08-16 NOTE — Assessment & Plan Note (Signed)
Recheck labs , continued ETOH use, he will eventually burn out pancreas as well

## 2015-08-16 NOTE — Assessment & Plan Note (Signed)
Ongoing abuse, he does not want help, we have given multiple resources, this is 3rd time this year in ER or hospital with upper GI bleed from his cirrhosis/varices.  Continue carafate He continues to decline psychiatry, he blames his chronic pain on reason why he drinks, advised pain clinics will not see him until cleared by psychiatry  I do not feel comfortable given any benzos, he has had benzos in [facility] when not prescribed. I will not refill the librium either

## 2015-08-17 LAB — ETHANOL, CLINICAL
Alcohol, Ethyl (B): NOT DETECTED g/dL(%)
Alcohol, Ethyl (B): NOT DETECTED mg/dL

## 2015-08-21 ENCOUNTER — Other Ambulatory Visit: Payer: Self-pay | Admitting: *Deleted

## 2015-08-21 MED ORDER — LACTULOSE 10 GM/15ML PO SOLN: 15.0000 g | Freq: Two times a day (BID) | ORAL | Status: DC

## 2015-08-24 ENCOUNTER — Telehealth: Payer: Self-pay | Admitting: *Deleted

## 2015-08-24 NOTE — Telephone Encounter (Signed)
Received request from pharmacy for PA on Carafate.   PA submitted.

## 2015-08-25 NOTE — Telephone Encounter (Signed)
Received fax from [facility] requesting further clinical information.   Form faxed to insurance.

## 2015-08-27 ENCOUNTER — Encounter: Payer: Self-pay | Admitting: *Deleted

## 2015-08-27 NOTE — Telephone Encounter (Signed)
Received PA determination.   PA denied.   Expedited Appeal faxed.

## 2015-08-28 NOTE — Telephone Encounter (Signed)
Received Appeal determination.   Appeal approved 08/27/2015- 02/14/2016.  [id number].  Pharmacy made aware.

## 2015-12-14 ENCOUNTER — Encounter: Payer: Self-pay | Admitting: Family Medicine

## 2015-12-14 ENCOUNTER — Ambulatory Visit (INDEPENDENT_AMBULATORY_CARE_PROVIDER_SITE_OTHER): Payer: Medicare Other | Admitting: Family Medicine

## 2015-12-14 ENCOUNTER — Encounter: Payer: Self-pay | Admitting: *Deleted

## 2015-12-14 VITALS — BP 130/62 | HR 72 | Temp 98.0°F | Resp 14 | Ht 74.0 in | Wt 217.0 lb

## 2015-12-14 DIAGNOSIS — F329 Major depressive disorder, single episode, unspecified: Secondary | ICD-10-CM

## 2015-12-14 DIAGNOSIS — Z23 Encounter for immunization: Secondary | ICD-10-CM

## 2015-12-14 DIAGNOSIS — F32A Depression, unspecified: Secondary | ICD-10-CM

## 2015-12-14 DIAGNOSIS — I10 Essential (primary) hypertension: Secondary | ICD-10-CM

## 2015-12-14 DIAGNOSIS — N644 Mastodynia: Secondary | ICD-10-CM

## 2015-12-14 DIAGNOSIS — F418 Other specified anxiety disorders: Secondary | ICD-10-CM | POA: Diagnosis not present

## 2015-12-14 DIAGNOSIS — R300 Dysuria: Secondary | ICD-10-CM | POA: Diagnosis not present

## 2015-12-14 DIAGNOSIS — F419 Anxiety disorder, unspecified: Secondary | ICD-10-CM

## 2015-12-14 LAB — URINALYSIS, ROUTINE W REFLEX MICROSCOPIC
Bilirubin Urine: NEGATIVE
LEUKOCYTES UA: NEGATIVE
Nitrite: NEGATIVE
PH: 6 (ref 5.0–8.0)
SPECIFIC GRAVITY, URINE: 1.015 (ref 1.001–1.035)

## 2015-12-14 LAB — URINALYSIS, MICROSCOPIC ONLY
CRYSTALS: NONE SEEN [HPF]
Casts: NONE SEEN [LPF]
Yeast: NONE SEEN [HPF]

## 2015-12-14 MED ORDER — LACTULOSE 10 GM/15ML PO SOLN: 15.0000 g | Freq: Two times a day (BID) | ORAL | 2 refills | Status: DC

## 2015-12-14 NOTE — Progress Notes (Signed)
Subjective:    Patient ID: [name], male    DOB: 1960/11/02, 55 y.o.   MRN: [id number]  Patient presents for 4 month F/U (is not fasting)   Pt here for f/u on  Medications.  HTN- Taking blood pressure medication as prescribed  Alcohol abuse he states he has not had any alcohol since her last visit 4 months ago. States that he has been trying to demonstrate near her. He still declines seeing psychiatry or getting any therapy.  His concerns today include continue right breast pain is not felt any lump or mass was still has significant pain in the right breast he's not had any discharge from the nipple.  He had some type of Core come  out of his left ear canal  He's had intermittent episodes of dysuria over the past month. He denies any hematuria no flank pain no abdominal pain no fever no penile discharge. No new sexual partners  He states that his disability needs a record of the dates where he came to my office over the past year   Review Of Systems:  GEN- denies fatigue, fever, weight loss,weakness, recent illness HEENT- denies eye drainage, change in vision, nasal discharge, CVS- denies chest pain, palpitations RESP- denies SOB, cough, wheeze ABD- denies N/V, change in stools, abd pain GU- denies dysuria, hematuria, dribbling, incontinence MSK- + joint pain, muscle aches, injury Neuro- denies headache, dizziness, syncope, seizure activity       Objective:    BP 130/62 (BP Location: Left Arm, Patient Position: Sitting, Cuff Size: Large)   Pulse 72   Temp 98 F (36.7 C) (Oral)   Resp 14   Ht 6\' 2"  (1.88 m)   Wt 217 lb (98.4 kg)   BMI 27.86 kg/m  GEN- NAD, alert and oriented x3 HEENT- PERRL, EOMI, non injected sclera, pink conjunctiva, MMM, oropharynx clear ,eft ear canal, no lesions noted, no erythema  CVS- RRR, no murmur Breast- normal symmetry, no nipple inversion,no nipple drainage, no nodules or lumps felt,TTP across right breast tissue   RESP-CTAB ABD-NABS,soft,NT,ND, no CVA tenderness  Psych- mildly anxious appearing- more comfortable than usual, tremor at baseline, EXT- No edema Pulses- Radial  2+        Assessment & Plan:      Problem List Items Addressed This Visit    Essential hypertension    Blood pressure much improved today hopefully usually staying off of alcohol he's also lost some weight which I think this contributes to him not drinking      Anxiety and depression    COntinue with current medications (Prozac, buspar). I'm not prescribing benzodiazepines he continues to decline psychiatry which will also help with his alcohol addiction.       Other Visit Diagnoses    Need for prophylactic vaccination and inoculation against influenza    -  Primary   Relevant Orders   Flu Vaccine QUAD 36+ mos PF IM (Fluarix & Fluzone Quad PF) (Completed)   Breast pain, right       Obtain US and mammogram right breast, pain, discomfort, no mass palpated for > 4 months   Dysuria       Check Urine culture, intermittant symptoms   Relevant Orders   Urinalysis, Routine w reflex microscopic (not at [facility]) (Completed)   Urine culture      Note: This dictation was prepared with Dragon dictation along with smaller phrase technology. Any transcriptional errors that result from this process are unintentional.

## 2015-12-14 NOTE — Assessment & Plan Note (Signed)
Blood pressure much improved today hopefully usually staying off of alcohol he's also lost some weight which I think this contributes to him not drinking

## 2015-12-14 NOTE — Patient Instructions (Addendum)
Referral for ultrasound and mammogram  We will call with lurine results  Flu shot  F/U 4 months

## 2015-12-14 NOTE — Assessment & Plan Note (Signed)
COntinue with current medications (Prozac, buspar). I'm not prescribing benzodiazepines he continues to decline psychiatry which will also help with his alcohol addiction.

## 2015-12-16 ENCOUNTER — Other Ambulatory Visit: Payer: Self-pay | Admitting: Family Medicine

## 2015-12-16 DIAGNOSIS — N644 Mastodynia: Secondary | ICD-10-CM

## 2015-12-17 LAB — URINE CULTURE

## 2015-12-18 ENCOUNTER — Other Ambulatory Visit: Payer: Self-pay | Admitting: *Deleted

## 2015-12-18 MED ORDER — LEVOFLOXACIN 500 MG PO TABS: 500.0000 mg | ORAL_TABLET | Freq: Every day | ORAL | 0 refills | Status: DC

## 2015-12-22 ENCOUNTER — Encounter (HOSPITAL_COMMUNITY): Payer: Medicare Other

## 2015-12-22 ENCOUNTER — Inpatient Hospital Stay (HOSPITAL_COMMUNITY): Admission: RE | Admit: 2015-12-22 | Payer: Medicare Other | Source: Ambulatory Visit

## 2016-02-21 ENCOUNTER — Other Ambulatory Visit: Payer: Self-pay | Admitting: Family Medicine

## 2016-02-22 NOTE — Telephone Encounter (Signed)
Prescription sent to pharmacy.

## 2016-02-22 NOTE — Telephone Encounter (Signed)
okay

## 2016-02-22 NOTE — Telephone Encounter (Signed)
Ok to refill 

## 2016-03-07 ENCOUNTER — Telehealth: Payer: Self-pay | Admitting: *Deleted

## 2016-03-07 MED ORDER — BUSPIRONE HCL 7.5 MG PO TABS: 7.5000 mg | ORAL_TABLET | Freq: Two times a day (BID) | ORAL | 3 refills | Status: DC

## 2016-03-07 MED ORDER — FOLIC ACID 1 MG PO TABS: 1.0000 mg | ORAL_TABLET | Freq: Every day | ORAL | 1 refills | Status: DC

## 2016-03-07 MED ORDER — SUCRALFATE 1 GM/10ML PO SUSP: 1.0000 g | Freq: Three times a day (TID) | ORAL | 1 refills | Status: DC

## 2016-03-07 MED ORDER — OMEPRAZOLE 20 MG PO CPDR: 20.0000 mg | DELAYED_RELEASE_CAPSULE | Freq: Two times a day (BID) | ORAL | 3 refills | Status: DC

## 2016-03-07 MED ORDER — FLUOXETINE HCL 40 MG PO CAPS: 40.0000 mg | ORAL_CAPSULE | Freq: Every day | ORAL | 6 refills | Status: DC

## 2016-03-07 MED ORDER — LISINOPRIL-HYDROCHLOROTHIAZIDE 10-12.5 MG PO TABS: 1.0000 | ORAL_TABLET | Freq: Every day | ORAL | 3 refills | Status: DC

## 2016-03-07 MED ORDER — PROPRANOLOL HCL 10 MG PO TABS: 10.0000 mg | ORAL_TABLET | Freq: Two times a day (BID) | ORAL | 3 refills | Status: DC

## 2016-03-07 MED ORDER — CYCLOBENZAPRINE HCL 10 MG PO TABS: 10.0000 mg | ORAL_TABLET | Freq: Two times a day (BID) | ORAL | 3 refills | Status: DC | PRN

## 2016-03-07 NOTE — Telephone Encounter (Signed)
Prescriptions sent to pharmacy

## 2016-03-07 NOTE — Telephone Encounter (Signed)
Received call from patient.   Requested refill on all routine meds to [facility].   Also requested refill on Flexeril to [facility]. Ok to refill?

## 2016-03-07 NOTE — Telephone Encounter (Signed)
okay

## 2016-03-12 IMAGING — CR DG ANKLE COMPLETE 3+V*L*
3 series · 3 of 3 positions shown · non-contrast
Comparison: None.

CLINICAL DATA: Ankle pain

EXAM:
LEFT ANKLE COMPLETE - 3+ VIEW

[view not recorded (1 of 3)]
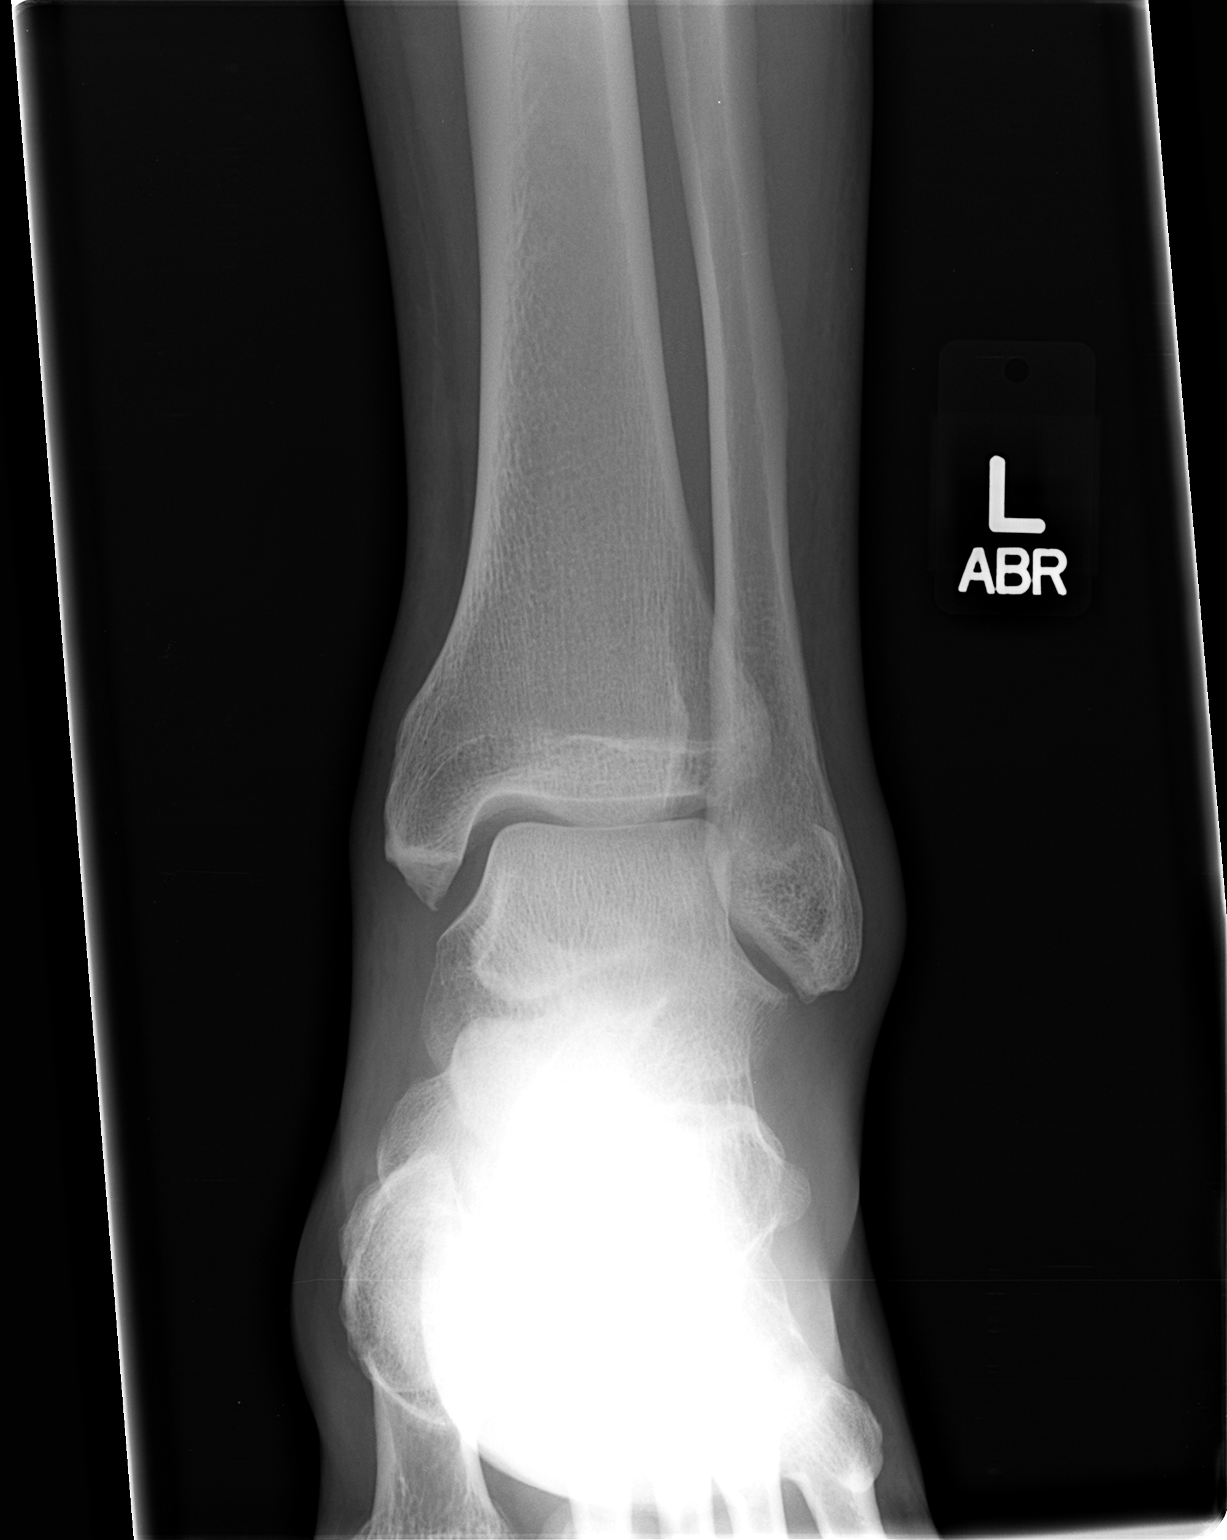

[view not recorded (2 of 3)]
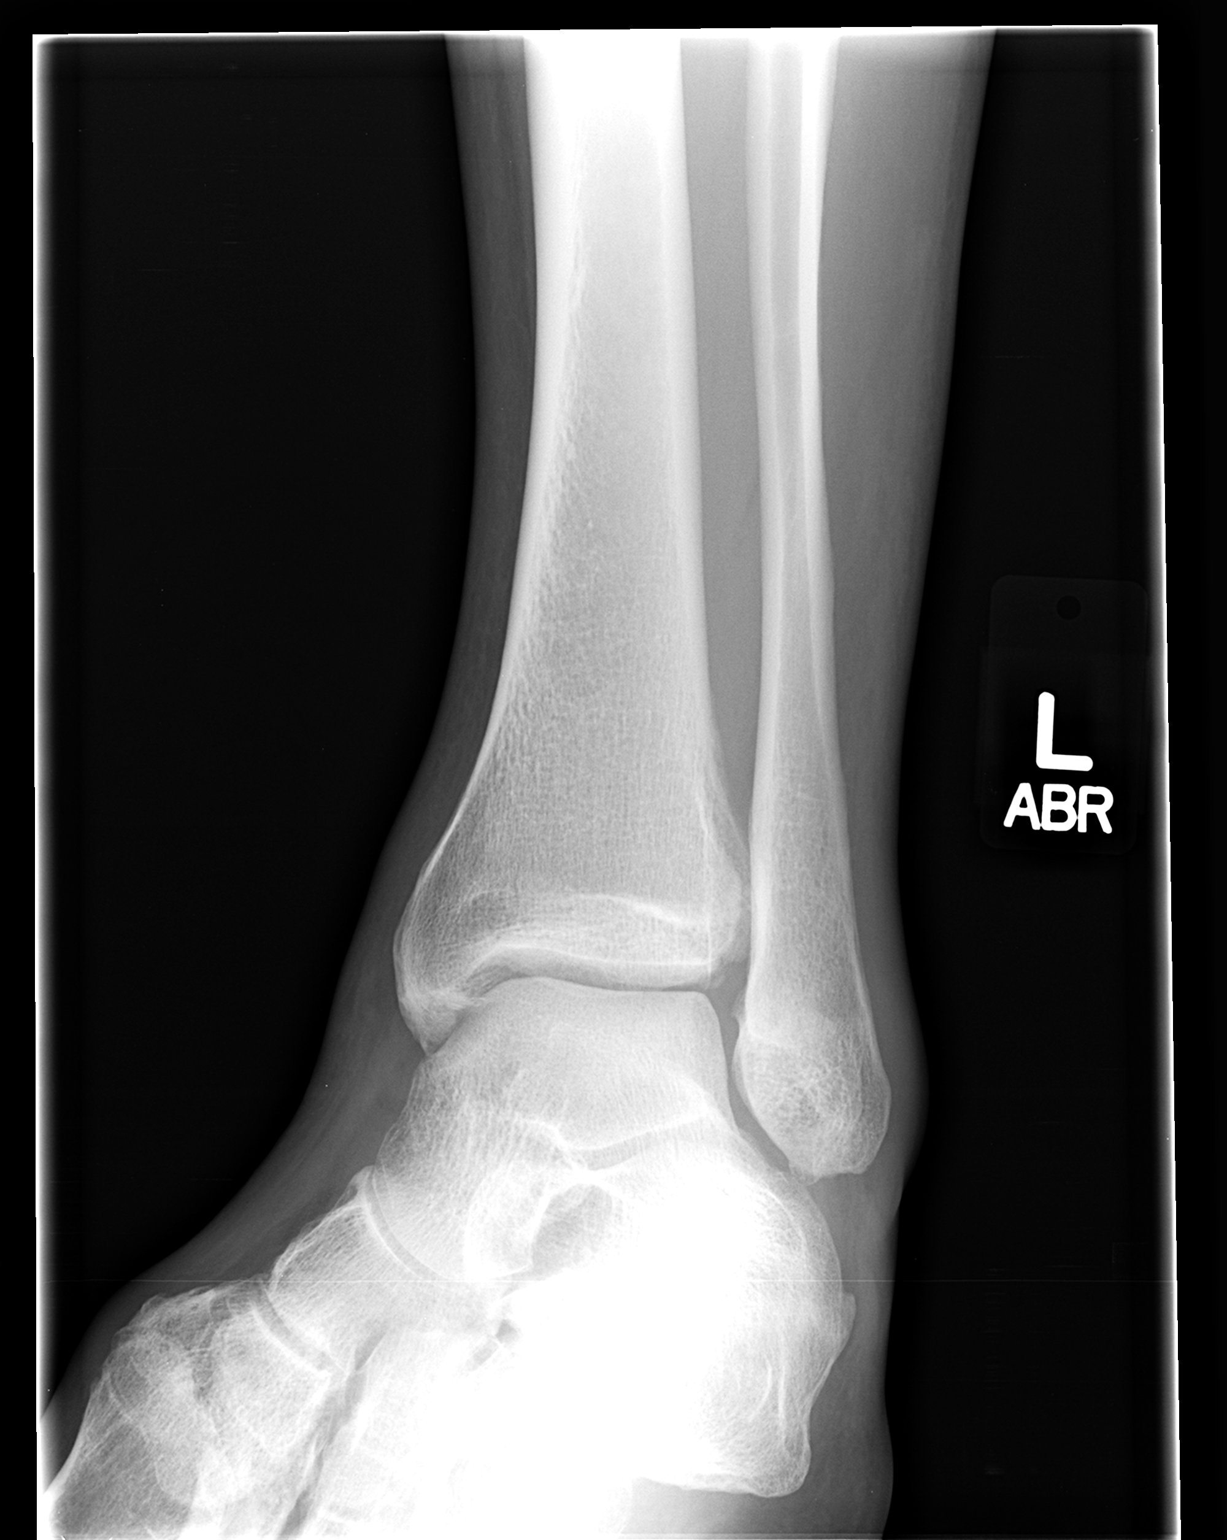

[view not recorded (3 of 3)]
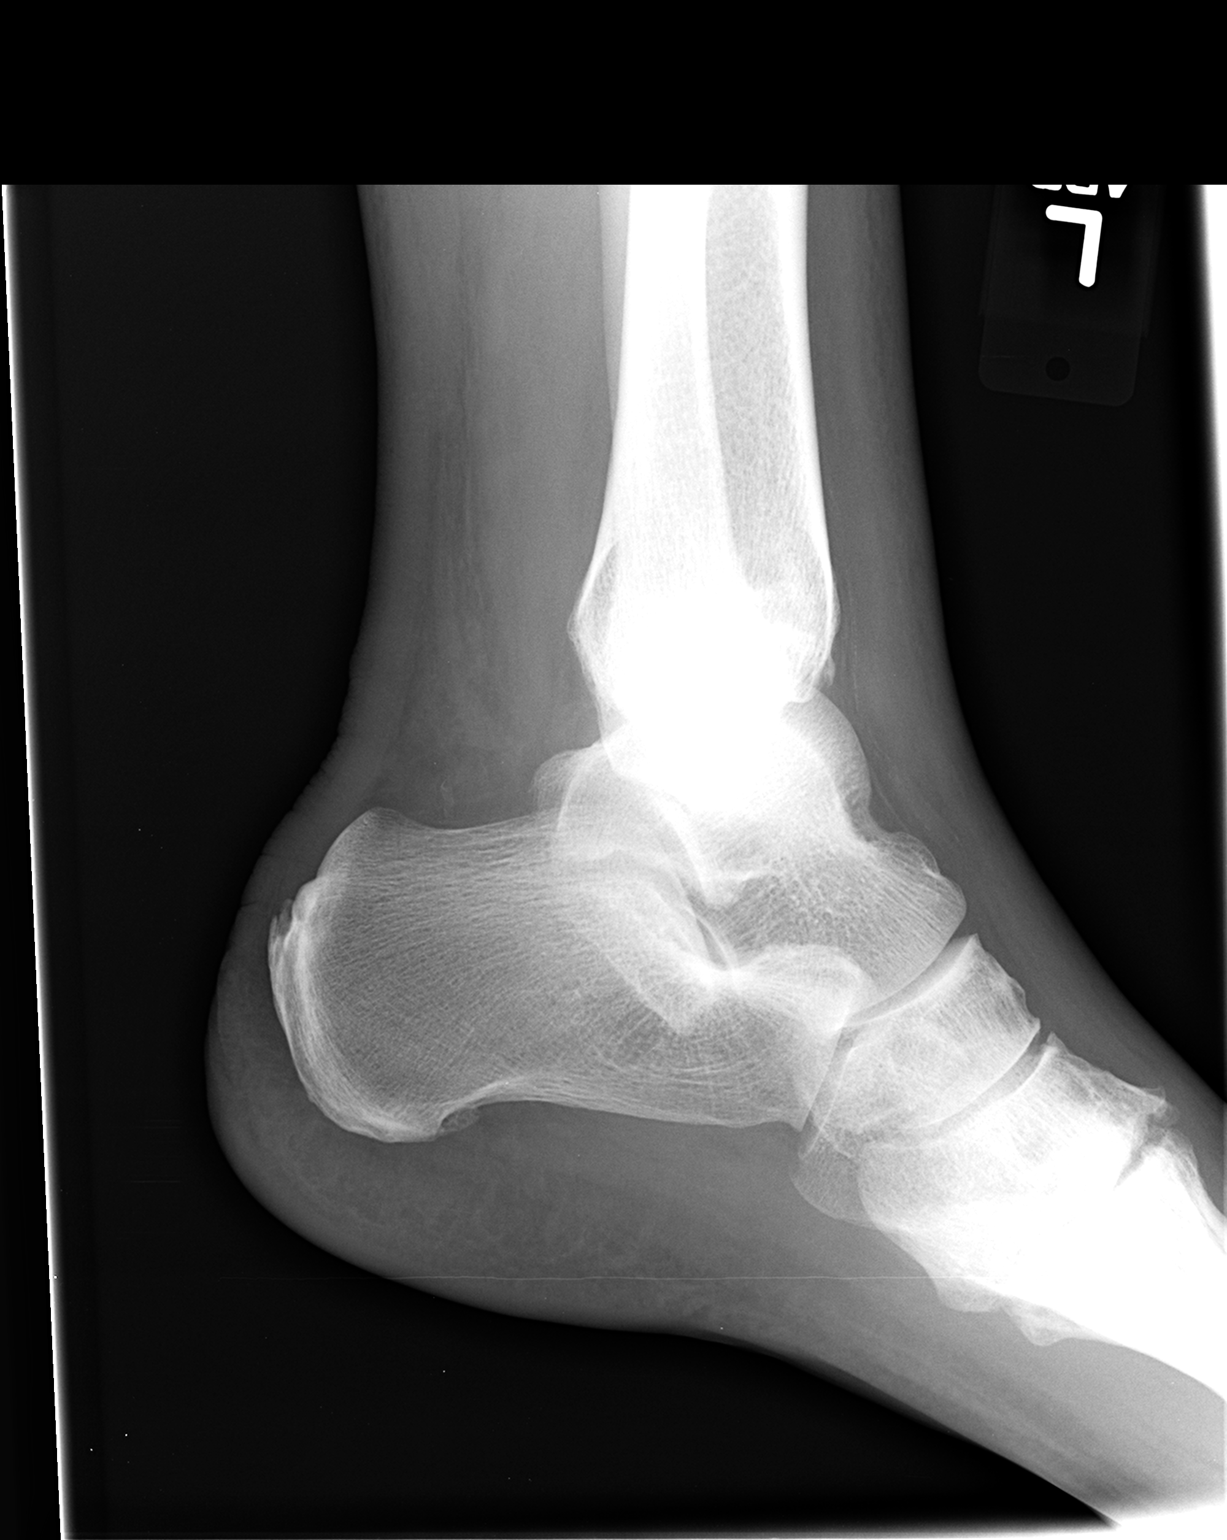

[3 of 3 positions shown; findings below may reference images not displayed]

FINDINGS: There is no evidence of fracture, dislocation, or joint effusion.
There is no evidence of arthropathy or other focal bone abnormality.
Soft tissues are unremarkable.
IMPRESSION: No acute fracture dislocation.

## 2016-03-15 ENCOUNTER — Ambulatory Visit (HOSPITAL_COMMUNITY)
Admission: RE | Admit: 2016-03-15 | Discharge: 2016-03-15 | Disposition: A | Discharge status: Home / Self Care | Payer: Medicare Other | Source: Ambulatory Visit | Attending: Family Medicine | Admitting: Family Medicine

## 2016-03-15 DIAGNOSIS — N62 Hypertrophy of breast: Secondary | ICD-10-CM | POA: Insufficient documentation

## 2016-03-15 DIAGNOSIS — N644 Mastodynia: Secondary | ICD-10-CM | POA: Diagnosis present

## 2016-03-17 ENCOUNTER — Telehealth: Payer: Self-pay | Admitting: *Deleted

## 2016-03-17 NOTE — Telephone Encounter (Signed)
Received PA determination.   PA approved 02/13/2016- 02/13/2017.  Reference # [id number].  Pharmacy made aware.

## 2016-03-17 NOTE — Telephone Encounter (Signed)
Received request from pharmacy for PA on Carafate.  PA submitted.   Dx: K70- Upper GI bleed (K92.2) from his cirrhosis and his alcohol abuse/NSAID use. He was given a combination of Protonix with Zantac and Carafate 4 times a day. This regimen has effectively controled the hematemesis.   Your PA has been faxed to the plan as a paper copy. Please contact the plan directly if you haven't received a determination in a typical timeframe. You will be notified of the determination via fax.

## 2016-03-21 ENCOUNTER — Telehealth: Payer: Self-pay | Admitting: *Deleted

## 2016-03-21 MED ORDER — OMEPRAZOLE 40 MG PO CPDR: 40.0000 mg | DELAYED_RELEASE_CAPSULE | Freq: Every day | ORAL | 1 refills | Status: DC

## 2016-03-21 NOTE — Telephone Encounter (Signed)
Received mail from insurance regarding the prescription for Omeprazole 20mg  PO BID.   Insurance will not cover this medication.   MD made aware and recommendations are as follows:  D/C Omeprazole 20mg  PO BID.  Begin Omeprazole 40mg  PO QD.    Prescription sent to pharmacy.

## 2016-04-15 ENCOUNTER — Inpatient Hospital Stay: Payer: Medicare Other | Admitting: Family Medicine

## 2016-04-27 ENCOUNTER — Other Ambulatory Visit: Payer: Self-pay | Admitting: Family Medicine

## 2016-06-01 ENCOUNTER — Encounter (HOSPITAL_COMMUNITY): Payer: Self-pay | Admitting: Emergency Medicine

## 2016-06-01 ENCOUNTER — Ambulatory Visit (INDEPENDENT_AMBULATORY_CARE_PROVIDER_SITE_OTHER): Payer: Medicare Other | Admitting: Family Medicine

## 2016-06-01 ENCOUNTER — Encounter: Payer: Self-pay | Admitting: Family Medicine

## 2016-06-01 ENCOUNTER — Inpatient Hospital Stay (HOSPITAL_COMMUNITY)
Admission: EM | Admit: 2016-06-01 | Discharge: 2016-06-04 | DRG: 309 | Disposition: A | Discharge status: Home / Self Care | Payer: Medicare Other | Attending: Internal Medicine | Admitting: Internal Medicine

## 2016-06-01 ENCOUNTER — Emergency Department (HOSPITAL_COMMUNITY): Payer: Medicare Other

## 2016-06-01 ENCOUNTER — Encounter: Payer: Self-pay | Admitting: *Deleted

## 2016-06-01 VITALS — BP 136/88 | HR 120 | Temp 98.1°F | Resp 14 | Ht 74.0 in | Wt 240.0 lb

## 2016-06-01 DIAGNOSIS — Z79899 Other long term (current) drug therapy: Secondary | ICD-10-CM

## 2016-06-01 DIAGNOSIS — R609 Edema, unspecified: Secondary | ICD-10-CM

## 2016-06-01 DIAGNOSIS — E785 Hyperlipidemia, unspecified: Secondary | ICD-10-CM | POA: Diagnosis present

## 2016-06-01 DIAGNOSIS — F329 Major depressive disorder, single episode, unspecified: Secondary | ICD-10-CM | POA: Diagnosis present

## 2016-06-01 DIAGNOSIS — K7031 Alcoholic cirrhosis of liver with ascites: Secondary | ICD-10-CM

## 2016-06-01 DIAGNOSIS — Z8719 Personal history of other diseases of the digestive system: Secondary | ICD-10-CM | POA: Diagnosis not present

## 2016-06-01 DIAGNOSIS — E663 Overweight: Secondary | ICD-10-CM | POA: Diagnosis present

## 2016-06-01 DIAGNOSIS — Z808 Family history of malignant neoplasm of other organs or systems: Secondary | ICD-10-CM

## 2016-06-01 DIAGNOSIS — Z87898 Personal history of other specified conditions: Secondary | ICD-10-CM

## 2016-06-01 DIAGNOSIS — I252 Old myocardial infarction: Secondary | ICD-10-CM | POA: Diagnosis not present

## 2016-06-01 DIAGNOSIS — Z8249 Family history of ischemic heart disease and other diseases of the circulatory system: Secondary | ICD-10-CM | POA: Diagnosis not present

## 2016-06-01 DIAGNOSIS — I10 Essential (primary) hypertension: Secondary | ICD-10-CM | POA: Diagnosis present

## 2016-06-01 DIAGNOSIS — Z6829 Body mass index (BMI) 29.0-29.9, adult: Secondary | ICD-10-CM

## 2016-06-01 DIAGNOSIS — I4891 Unspecified atrial fibrillation: Secondary | ICD-10-CM | POA: Diagnosis present

## 2016-06-01 DIAGNOSIS — Z87891 Personal history of nicotine dependence: Secondary | ICD-10-CM | POA: Diagnosis not present

## 2016-06-01 DIAGNOSIS — F10188 Alcohol abuse with other alcohol-induced disorder: Secondary | ICD-10-CM | POA: Diagnosis present

## 2016-06-01 DIAGNOSIS — M5137 Other intervertebral disc degeneration, lumbosacral region: Secondary | ICD-10-CM | POA: Diagnosis present

## 2016-06-01 DIAGNOSIS — Z91030 Bee allergy status: Secondary | ICD-10-CM | POA: Diagnosis not present

## 2016-06-01 DIAGNOSIS — F419 Anxiety disorder, unspecified: Secondary | ICD-10-CM | POA: Diagnosis present

## 2016-06-01 DIAGNOSIS — Z8349 Family history of other endocrine, nutritional and metabolic diseases: Secondary | ICD-10-CM

## 2016-06-01 DIAGNOSIS — Z8673 Personal history of transient ischemic attack (TIA), and cerebral infarction without residual deficits: Secondary | ICD-10-CM | POA: Diagnosis not present

## 2016-06-01 DIAGNOSIS — Z23 Encounter for immunization: Secondary | ICD-10-CM

## 2016-06-01 DIAGNOSIS — I110 Hypertensive heart disease with heart failure: Secondary | ICD-10-CM | POA: Diagnosis present

## 2016-06-01 DIAGNOSIS — I959 Hypotension, unspecified: Secondary | ICD-10-CM | POA: Diagnosis present

## 2016-06-01 DIAGNOSIS — G8929 Other chronic pain: Secondary | ICD-10-CM | POA: Diagnosis present

## 2016-06-01 DIAGNOSIS — Z9181 History of falling: Secondary | ICD-10-CM

## 2016-06-01 DIAGNOSIS — R002 Palpitations: Secondary | ICD-10-CM | POA: Diagnosis present

## 2016-06-01 DIAGNOSIS — K7011 Alcoholic hepatitis with ascites: Secondary | ICD-10-CM | POA: Diagnosis present

## 2016-06-01 DIAGNOSIS — I5042 Chronic combined systolic (congestive) and diastolic (congestive) heart failure: Secondary | ICD-10-CM | POA: Diagnosis present

## 2016-06-01 DIAGNOSIS — Z800 Family history of malignant neoplasm of digestive organs: Secondary | ICD-10-CM

## 2016-06-01 DIAGNOSIS — F1011 Alcohol abuse, in remission: Secondary | ICD-10-CM

## 2016-06-01 DIAGNOSIS — I482 Chronic atrial fibrillation: Secondary | ICD-10-CM | POA: Diagnosis not present

## 2016-06-01 LAB — CBC WITH DIFFERENTIAL/PLATELET
Basophils Absolute: 0.1 10*3/uL (ref 0.0–0.1)
Basophils Relative: 1 %
EOS PCT: 2 %
Eosinophils Absolute: 0.1 10*3/uL (ref 0.0–0.7)
HCT: 37.7 % — ABNORMAL LOW (ref 39.0–52.0)
HEMOGLOBIN: 13.1 g/dL (ref 13.0–17.0)
LYMPHS ABS: 2.1 10*3/uL (ref 0.7–4.0)
LYMPHS PCT: 33 %
MCH: 35.7 pg — ABNORMAL HIGH (ref 26.0–34.0)
MCHC: 34.7 g/dL (ref 30.0–36.0)
MCV: 102.7 fL — AB (ref 78.0–100.0)
MONOS PCT: 13 %
Monocytes Absolute: 0.8 10*3/uL (ref 0.1–1.0)
Neutro Abs: 3.2 10*3/uL (ref 1.7–7.7)
Neutrophils Relative %: 52 %
PLATELETS: 117 10*3/uL — AB (ref 150–400)
RBC: 3.67 MIL/uL — AB (ref 4.22–5.81)
RDW: 15.7 % — ABNORMAL HIGH (ref 11.5–15.5)
WBC: 6.3 10*3/uL (ref 4.0–10.5)

## 2016-06-01 LAB — COMPREHENSIVE METABOLIC PANEL
ALT: 25 U/L (ref 17–63)
AST: 63 U/L — AB (ref 15–41)
Albumin: 2.1 g/dL — ABNORMAL LOW (ref 3.5–5.0)
Alkaline Phosphatase: 151 U/L — ABNORMAL HIGH (ref 38–126)
Anion gap: 8 (ref 5–15)
BUN: 18 mg/dL (ref 6–20)
CHLORIDE: 103 mmol/L (ref 101–111)
CO2: 24 mmol/L (ref 22–32)
CREATININE: 1.21 mg/dL (ref 0.61–1.24)
Calcium: 7.9 mg/dL — ABNORMAL LOW (ref 8.9–10.3)
GFR calc Af Amer: >60 mL/min (ref >60)
GFR calc non Af Amer: >60 mL/min (ref >60)
Glucose, Bld: 122 mg/dL — ABNORMAL HIGH (ref 65–99)
Potassium: 3.9 mmol/L (ref 3.5–5.1)
SODIUM: 135 mmol/L (ref 135–145)
Total Bilirubin: 1.8 mg/dL — ABNORMAL HIGH (ref 0.3–1.2)
Total Protein: 6.4 g/dL — ABNORMAL LOW (ref 6.5–8.1)

## 2016-06-01 LAB — TROPONIN I: Troponin I: <0.03 ng/mL (ref <0.03)

## 2016-06-01 MED ORDER — ACETAMINOPHEN 650 MG RE SUPP
650.0000 mg | Freq: Four times a day (QID) | RECTAL | Status: DC | PRN
Start: 2016-06-01 — End: 2016-06-04

## 2016-06-01 MED ORDER — SUCRALFATE 1 GM/10ML PO SUSP
1.0000 g | Freq: Three times a day (TID) | ORAL | Status: DC
  Administered 2016-06-02 – 2016-06-04 (×9): 1 g via ORAL
  Filled 2016-06-01 (×10): qty 10

## 2016-06-01 MED ORDER — DILTIAZEM HCL 25 MG/5ML IV SOLN
10.0000 mg | Freq: Once | INTRAVENOUS | Status: AC
  Administered 2016-06-01: 10 mg via INTRAVENOUS

## 2016-06-01 MED ORDER — FLUOXETINE HCL 20 MG PO CAPS
40.0000 mg | ORAL_CAPSULE | Freq: Every day | ORAL | Status: DC
  Administered 2016-06-01 – 2016-06-03 (×3): 40 mg via ORAL
  Filled 2016-06-01 (×3): qty 2

## 2016-06-01 MED ORDER — ACETAMINOPHEN 325 MG PO TABS
650.0000 mg | ORAL_TABLET | Freq: Four times a day (QID) | ORAL | Status: DC | PRN
  Filled 2016-06-01: qty 2

## 2016-06-01 MED ORDER — ONDANSETRON HCL 4 MG PO TABS: 4.0000 mg | ORAL_TABLET | Freq: Four times a day (QID) | ORAL | Status: DC | PRN

## 2016-06-01 MED ORDER — SPIRONOLACTONE 25 MG PO TABS
25.0000 mg | ORAL_TABLET | Freq: Every day | ORAL | Status: DC
  Administered 2016-06-02 – 2016-06-04 (×3): 25 mg via ORAL
  Filled 2016-06-01 (×3): qty 1

## 2016-06-01 MED ORDER — PANTOPRAZOLE SODIUM 40 MG PO TBEC
40.0000 mg | DELAYED_RELEASE_TABLET | Freq: Every day | ORAL | Status: DC
  Administered 2016-06-02 – 2016-06-04 (×3): 40 mg via ORAL
  Filled 2016-06-01 (×3): qty 1

## 2016-06-01 MED ORDER — HYDROCHLOROTHIAZIDE 12.5 MG PO CAPS
12.5000 mg | ORAL_CAPSULE | Freq: Every day | ORAL | Status: DC
Start: 2016-06-02 — End: 2016-06-03
  Administered 2016-06-02 – 2016-06-03 (×2): 12.5 mg via ORAL
  Filled 2016-06-01 (×2): qty 1

## 2016-06-01 MED ORDER — FOLIC ACID 1 MG PO TABS
1.0000 mg | ORAL_TABLET | Freq: Every day | ORAL | Status: DC
  Administered 2016-06-02 – 2016-06-04 (×3): 1 mg via ORAL
  Filled 2016-06-01 (×3): qty 1

## 2016-06-01 MED ORDER — BUSPIRONE HCL 5 MG PO TABS
15.0000 mg | ORAL_TABLET | Freq: Every day | ORAL | Status: DC
  Administered 2016-06-02 – 2016-06-04 (×3): 15 mg via ORAL
  Filled 2016-06-01 (×3): qty 3

## 2016-06-01 MED ORDER — PROPRANOLOL HCL 20 MG PO TABS
10.0000 mg | ORAL_TABLET | Freq: Two times a day (BID) | ORAL | Status: DC
  Administered 2016-06-01 – 2016-06-03 (×4): 10 mg via ORAL
  Filled 2016-06-01 (×4): qty 1

## 2016-06-01 MED ORDER — ONDANSETRON HCL 4 MG/2ML IJ SOLN
4.0000 mg | Freq: Four times a day (QID) | INTRAMUSCULAR | Status: DC | PRN
  Administered 2016-06-02: 4 mg via INTRAVENOUS
  Filled 2016-06-01: qty 2

## 2016-06-01 MED ORDER — LISINOPRIL 10 MG PO TABS
10.0000 mg | ORAL_TABLET | Freq: Every day | ORAL | Status: DC
  Administered 2016-06-02 – 2016-06-04 (×3): 10 mg via ORAL
  Filled 2016-06-01 (×3): qty 1

## 2016-06-01 MED ORDER — LACTULOSE 10 GM/15ML PO SOLN
15.0000 g | Freq: Two times a day (BID) | ORAL | Status: DC
  Administered 2016-06-01 – 2016-06-04 (×6): 15 g via ORAL
  Filled 2016-06-01 (×6): qty 30

## 2016-06-01 MED ORDER — DILTIAZEM HCL-DEXTROSE 100-5 MG/100ML-% IV SOLN (PREMIX)
5.0000 mg/h | INTRAVENOUS | Status: DC
  Administered 2016-06-01: 5 mg/h via INTRAVENOUS
  Filled 2016-06-01 (×2): qty 100

## 2016-06-01 MED ORDER — ASPIRIN EC 81 MG PO TBEC
81.0000 mg | DELAYED_RELEASE_TABLET | Freq: Every day | ORAL | Status: DC
  Administered 2016-06-02 – 2016-06-03 (×2): 81 mg via ORAL
  Filled 2016-06-01 (×3): qty 1

## 2016-06-01 NOTE — ED Notes (Signed)
Report given to Fred RN in ICU 

## 2016-06-01 NOTE — Patient Instructions (Addendum)
Go directly to [place] in office A fib with RVR HR up to 160, Fluid retention Cirrhosis with ascites  Weight up at least 15 lbs
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
## 2016-06-01 NOTE — Progress Notes (Signed)
Subjective:    Patient ID: [name], male    DOB: Jun 19, 1960, 56 y.o.   MRN: [id number]  Patient presents for Hospital F/U [facility]- edema/ irregular HR)  HA here for hospital follow-up. He was actually admitted to [facility] healthcare on February 18 with nausea vomiting diarrhea he was found to be in atrial fibrillation. He was given IV Lopressor he converted and he was discharged home after his symptoms resolved. He was told to follow-up with cardiology. He was not anticoagulated because of his cirrhosis of the liver and history of GI bleed peptic ulcer disease. Note he states that he had been having some shortness of breath and also fluid retention before going in the hospital and states that it has worsened since he has left 2 months ago his abdomen never goes down in his legs are always severely swollen but states that he feels okay. He does hit short of breath when he is walking around he does feel his heart fluttering at times. Weight 2 months ago was 230 pounds today he is up 10 pounds. His baseline weight is typically 215 to 220. He isn't taking all his medications as prescribed. He has underlying cirrhosis of the liver he denies any alcohol use. He states that he did take a pain pill given by his girlfriend but has not taken any other medications that were not prescribed recently.  Review Of Systems:  GEN- denies fatigue, fever, weight loss,weakness, recent illness HEENT- denies eye drainage, change in vision, nasal discharge, CVS- denies chest pain, +palpitations RESP- +SOB, cough, wheeze ABD- denies N/V, change in stools, abd pain GU- denies dysuria, hematuria, dribbling, incontinence MSK+ joint pain, muscle aches, injury Neuro- denies headache, dizziness, syncope, seizure activity       Objective:    BP 136/88   Pulse (!) 120   Temp 98.1 F (36.7 C) (Oral)   Resp 14   Ht 6\' 2"  (1.88 m)   Wt 240 lb (108.9 kg)   SpO2 98%   BMI 30.81 kg/m  GEN-  NAD, alert and oriented x3 HEENT- PERRL, EOMI, non injected sclera, pink conjunctiva, MMM, oropharynx clear CVS- irregular rhythem, irregular rate ( 130-160's)   RESP-CTAB ABD-NABS,soft,distended appears about 9 months pregnant, +fluid wave  EXT- Pitting edema to his upper thighs Pulses- Radial  2+  EKG- AFIB/FLUTTER HR 160's       Assessment & Plan:      Problem List Items Addressed This Visit    Cirrhosis of liver ([place])             Peripheral Edema   Other Visit Diagnoses    Atrial fibrillation with RVR ([place])    -  Primary   Afib/Flutter with RVR, significant fluid overload which is likely a combination of this A fib, some heart failure and his liver failure with ascites. He is on Atenolol due to history of anxiety as well as prophylaxis for esophageal tear sees. He is on spironolactone as well because of his cirrhosis as well as lactulose. He is a very complex patient and I do not think this can be treated outpatient.  He declined going to the hospital [facility] even though his heart rate was in the 160s he states he will go directly to [facility]. I think he is in need diuresis as well as weight control. Of concern that he may also have renal failure as he has continued to take his lisinopril HCTZ in the setting of  all of his fluid overload  From his baseline he has at least 15-20 pounds of excess fluid and he is up exactly 10 pounds from his hospital discharge back in February.    Relevant Orders   EKG 12-Lead (Completed)      Note: This dictation was prepared with Dragon dictation along with smaller phrase technology. Any transcriptional errors that result from this process are unintentional.

## 2016-06-01 NOTE — ED Triage Notes (Signed)
PT states he went to his PCP today due to heart palpations intermittently since 03/2016. PT states admission to [facility] in the week of 04/06/16 for Afib. PT denies any chest pain and states some SOB on exertion. Swelling noted to abdomen and bilateral lower extremities.

## 2016-06-01 NOTE — H&P (Signed)
History and Physical    [name] FYB:[id number] DOB: 12-21-1960 DOA: 06/01/2016  PCP: Vic Blackbird, MD  Patient coming from: Home.    Chief Complaint: palpitation.   HPI: [name] is an 56 y.o. male with hx of alcoholic cirrhosis, not actively drinking at this time, hx of afib not on anticoagulation due to fall and alcohol risk, anxiety, HTN, CVA, prior MI, presented to the ER as he was feeling palpitation, and found to be in afib with RVR.  He was given IV cardiazem bolus, and started on the drip with controlled HR.  He has tense ascites as well.  Work up included normal WBC, normal Hb, and platelet count of 117K.  EKG showed afib with RVR.   He has no CP, SOB, lightheadedness, fever, chills, or abdominal pain.  Hospitalist was asked to admit him for afib with RVR.   ED Course:  See above.  Rewiew of Systems:  Constitutional: Negative for malaise, fever and chills. No significant weight loss or weight gain Eyes: Negative for eye pain, redness and discharge, diplopia, visual changes, or flashes of light. ENMT: Negative for ear pain, hoarseness, nasal congestion, sinus pressure and sore throat. No headaches; tinnitus, drooling, or problem swallowing. Cardiovascular: Negative for chest pain, palpitations, diaphoresis, dyspnea and peripheral edema. ; No orthopnea, PND Respiratory: Negative for cough, hemoptysis, wheezing and stridor. No pleuritic chestpain. Gastrointestinal: Negative for diarrhea, constipation,  melena, blood in stool, hematemesis, jaundice and rectal bleeding.    Genitourinary: Negative for frequency, dysuria, incontinence,flank pain and hematuria; Musculoskeletal: Negative for back pain and neck pain. Negative for swelling and trauma.;  Skin: . Negative for pruritus, rash, abrasions, bruising and skin lesion.; ulcerations Neuro: Negative for headache, lightheadedness and neck stiffness. Negative for weakness, altered level of consciousness , altered mental  status, extremity weakness, burning feet, involuntary movement, seizure and syncope.  Psych: negative for anxiety, depression, insomnia, tearfulness, panic attacks, hallucinations, paranoia, suicidal or homicidal ideation    Past Medical History:  Diagnosis Date  . Anxiety   . Chronic back pain   . DDD (degenerative disc disease), lumbosacral   . Depression   . Elevated LFTs    secondary to ETOH  . History of alcohol abuse   . Hx of cardiac cath 1996  . Hyperlipidemia   . Hypertension   . Stroke [facility]) 2003    Past Surgical History:  Procedure Laterality Date  . CARDIAC CATHETERIZATION  1996  . COLONOSCOPY N/A 03/25/2015   Procedure: COLONOSCOPY;  Surgeon: Rogene Houston, MD;  Location[facility];  Service: Endoscopy;  Laterality: N/A;  1130  . ESOPHAGOGASTRODUODENOSCOPY N/A 03/25/2015   Procedure: ESOPHAGOGASTRODUODENOSCOPY (EGD);  Surgeon: Rogene Houston, MD;  Location: [facility];  Service: Endoscopy;  Laterality: N/A;     reports that he has never smoked. He has quit using smokeless tobacco. His smokeless tobacco use included Chew. He reports that he does not drink alcohol or use drugs.  Allergies  Allergen Reactions  . Bee Venom Anaphylaxis and Nausea Only  . Bee Venom Anaphylaxis, Nausea And Vomiting and Swelling    Throat swelling    Family History  Problem Relation Age of Onset  . Heart disease Mother   . Cancer Father     throat cancer  . Cancer Brother     melanoma  . Thyroid disease Sister      Prior to Admission medications   Medication Sig Start Date End Date Taking? Authorizing Provider  busPIRone (BUSPAR)  15 MG tablet Take 1 tablet by mouth daily. 04/01/16  Yes Historical Provider, MD  FLUoxetine (PROZAC) 40 MG capsule Take 1 capsule (40 mg total) by mouth at bedtime. 03/07/16  Yes [name], MD  folic acid (FOLVITE) 1 MG tablet Take 1 tablet (1 mg total) by mouth daily. 03/07/16  Yes [name], MD  lactulose (CHRONULAC) 10 GM/15ML  solution Take 22.5 mLs (15 g total) by mouth 2 (two) times daily. 12/14/15  Yes [name], MD  lisinopril-hydrochlorothiazide (PRINZIDE,ZESTORETIC) 10-12.5 MG tablet Take 1 tablet by mouth daily. 03/07/16  Yes [name], MD  Multiple Vitamin (MULTIVITAMIN WITH MINERALS) TABS tablet Take 1 tablet by mouth daily. Centrum   Yes Historical Provider, MD  omeprazole (PRILOSEC) 20 MG capsule Take 1 capsule by mouth 2 (two) times daily. 03/07/16  Yes Historical Provider, MD  propranolol (INDERAL) 10 MG tablet Take 1 tablet (10 mg total) by mouth 2 (two) times daily. 03/07/16  Yes [name], MD  ranitidine (ZANTAC) 150 MG capsule Take 1 capsule (150 mg total) by mouth 2 (two) times daily. 08/14/15  Yes [name], MD  spironolactone (ALDACTONE) 25 MG tablet TAKE 1 TABLET BY MOUTH DAILY 04/27/16  Yes [name], MD  sucralfate (CARAFATE) 1 GM/10ML suspension Take 10 mLs (1 g total) by mouth 4 (four) times daily -  with meals and at bedtime. 03/07/16  Yes [name], MD  cyclobenzaprine (FLEXERIL) 10 MG tablet Take 1 tablet (10 mg total) by mouth 2 (two) times daily as needed. Patient not taking: Reported on 06/01/2016 03/07/16   [name], MD  EPINEPHrine 0.3 mg/0.3 mL IJ SOAJ injection Inject 0.3 mLs (0.3 mg total) into the muscle once. 06/11/15   [name], MD    Physical Exam: Vitals:   06/01/16 1815 06/01/16 1830 06/01/16 1845 06/01/16 2001  BP:  125/85  114/75  Pulse: (!) 149 (!) 109 94 (!) 114  Resp: (!) 23 16 (!) 21 (!) 24  Temp:      TempSrc:      SpO2: 99% 97% 97% 99%  Weight:      Height:          Constitutional: NAD, calm, comfortable Vitals:   06/01/16 1815 06/01/16 1830 06/01/16 1845 06/01/16 2001  BP:  125/85  114/75  Pulse: (!) 149 (!) 109 94 (!) 114  Resp: (!) 23 16 (!) 21 (!) 24  Temp:      TempSrc:      SpO2: 99% 97% 97% 99%  Weight:      Height:       Eyes: PERRL, lids and conjunctivae normal ENMT: Mucous membranes are  moist. Posterior pharynx clear of any exudate or lesions.Normal dentition.  Neck: normal, supple, no masses, no thyromegaly Respiratory: clear to auscultation bilaterally, no wheezing, no crackles. Normal respiratory effort. No accessory muscle use.  Cardiovascular: Regular rate and rhythm, no murmurs / rubs / gallops. No extremity edema. 2+ pedal pulses. No carotid bruits.  Abdomen: no tenderness, no masses palpated. No hepatosplenomegaly. Bowel sounds positive.  Musculoskeletal: no clubbing / cyanosis. No joint deformity upper and lower extremities. Good ROM, no contractures. Normal muscle tone.  Skin: no rashes, lesions, ulcers. No induration Neurologic: CN 2-12 grossly intact. Sensation intact, DTR normal. Strength 5/5 in all 4.  Psychiatric: Normal judgment and insight. Alert and oriented x 3. Normal mood.     Labs on Admission: I have personally reviewed following labs and  imaging studies  CBC:  Recent Labs Lab 06/01/16 1800  WBC 6.3  NEUTROABS 3.2  HGB 13.1  HCT 37.7*  MCV 102.7*  PLT 390*   Basic Metabolic Panel:  Recent Labs Lab 06/01/16 1800  NA 135  K 3.9  CL 103  CO2 24  GLUCOSE 122*  BUN 18  CREATININE 1.21  CALCIUM 7.9*   GFR: Estimated Creatinine Clearance: 89.8 mL/min (by C-G formula based on SCr of 1.21 mg/dL). Liver Function Tests:  Recent Labs Lab 06/01/16 1800  AST 63*  ALT 25  ALKPHOS 151*  BILITOT 1.8*  PROT 6.4*  ALBUMIN 2.1*   Cardiac Enzymes:  Recent Labs Lab 06/01/16 1800  TROPONINI <0.03   Urine analysis:    Component Value Date/Time   COLORURINE AMBER (A) 12/14/2015 1439   APPEARANCEUR CLEAR 12/14/2015 1439   LABSPEC 1.015 12/14/2015 1439   PHURINE 6.0 12/14/2015 1439   GLUCOSEU TRACE (A) 12/14/2015 1439   HGBUR 3+ (A) 12/14/2015 1439   BILIRUBINUR NEGATIVE 12/14/2015 1439   KETONESUR TRACE (A) 12/14/2015 1439   PROTEINUR 1+ (A) 12/14/2015 1439   UROBILINOGEN 1.0 07/26/2014 2231   NITRITE NEGATIVE 12/14/2015 1439    LEUKOCYTESUR NEGATIVE 12/14/2015 1439    Radiological Exams on Admission: Dg Chest Portable 1 View  Result Date: 06/01/2016 CLINICAL DATA:  Intermittent palpitations since February. EXAM: PORTABLE CHEST 1 VIEW COMPARISON:  04/01/2016 FINDINGS: Heart size within normal limits. Mediastinal shadows are normal. Lungs are clear. The vascularity is normal. No effusions. No bony abnormalities. IMPRESSION: No active disease. Electronically Signed   By: Nelson Chimes M.D.   On: 06/01/2016 18:37    EKG: Independently reviewed.   Assessment/Plan Principal Problem:   A-fib (HCC) Active Problems:   Hypertension   History of alcohol abuse   Alcoholic hepatitis with ascites    PLAN:   Afib with RVR:  Not a candidate for anticoagulation.  Will continue with IV cardiazem tonight.  He is on BB already.  Will continue that.  Consult cardiology in the morning for further recommendation as desired by his PCP.  Obtain ECHO.  Check TSH.   Tense ascites:   Consider IR large volume paracentesis, once afib issue is under controlled.   HTN:  Stable.  Continue with meds.     DVT prophylaxis: SCD.  Code Status: FULL CODE.  Family Communication: None.  Disposition Plan: to home when able.  Consults called: None.  Admission status: inpatient.    Twala Collings MD FACP. [facility]  If 7PM-7AM, please contact night-coverage [email or web address] Password [facility]  06/01/2016, 9:04 PM

## 2016-06-02 ENCOUNTER — Inpatient Hospital Stay (HOSPITAL_COMMUNITY): Payer: Medicare Other

## 2016-06-02 DIAGNOSIS — I4891 Unspecified atrial fibrillation: Secondary | ICD-10-CM

## 2016-06-02 DIAGNOSIS — I482 Chronic atrial fibrillation: Secondary | ICD-10-CM

## 2016-06-02 DIAGNOSIS — K7031 Alcoholic cirrhosis of liver with ascites: Secondary | ICD-10-CM

## 2016-06-02 LAB — COMPREHENSIVE METABOLIC PANEL
ALBUMIN: 1.9 g/dL — AB (ref 3.5–5.0)
ALK PHOS: 144 U/L — AB (ref 38–126)
ALT: 22 U/L (ref 17–63)
ANION GAP: 9 (ref 5–15)
AST: 57 U/L — ABNORMAL HIGH (ref 15–41)
BUN: 16 mg/dL (ref 6–20)
CALCIUM: 7.7 mg/dL — AB (ref 8.9–10.3)
CO2: 22 mmol/L (ref 22–32)
Chloride: 104 mmol/L (ref 101–111)
Creatinine, Ser: 0.79 mg/dL (ref 0.61–1.24)
GFR calc Af Amer: >60 mL/min (ref >60)
GFR calc non Af Amer: >60 mL/min (ref >60)
Glucose, Bld: 113 mg/dL — ABNORMAL HIGH (ref 65–99)
Potassium: 3.7 mmol/L (ref 3.5–5.1)
SODIUM: 135 mmol/L (ref 135–145)
Total Bilirubin: 1.9 mg/dL — ABNORMAL HIGH (ref 0.3–1.2)
Total Protein: 5.8 g/dL — ABNORMAL LOW (ref 6.5–8.1)

## 2016-06-02 LAB — MRSA PCR SCREENING: MRSA by PCR: POSITIVE — AB

## 2016-06-02 LAB — PROTIME-INR
INR: 1.38
Prothrombin Time: 17.1 seconds — ABNORMAL HIGH (ref 11.4–15.2)

## 2016-06-02 LAB — CBC
HCT: 34 % — ABNORMAL LOW (ref 39.0–52.0)
HEMOGLOBIN: 12 g/dL — AB (ref 13.0–17.0)
MCH: 35.9 pg — AB (ref 26.0–34.0)
MCHC: 35.3 g/dL (ref 30.0–36.0)
MCV: 101.8 fL — AB (ref 78.0–100.0)
Platelets: 103 10*3/uL — ABNORMAL LOW (ref 150–400)
RBC: 3.34 MIL/uL — ABNORMAL LOW (ref 4.22–5.81)
RDW: 15.7 % — ABNORMAL HIGH (ref 11.5–15.5)
WBC: 5.6 10*3/uL (ref 4.0–10.5)

## 2016-06-02 LAB — ECHOCARDIOGRAM COMPLETE
Height: 75 in
Weight: 3862.46 oz

## 2016-06-02 LAB — TSH: TSH: 1.222 u[IU]/mL (ref 0.350–4.500)

## 2016-06-02 MED ORDER — VITAMIN B-1 100 MG PO TABS
100.0000 mg | ORAL_TABLET | Freq: Every day | ORAL | Status: DC
  Administered 2016-06-02 – 2016-06-04 (×3): 100 mg via ORAL
  Filled 2016-06-02 (×3): qty 1

## 2016-06-02 MED ORDER — FOLIC ACID 1 MG PO TABS: 1.0000 mg | ORAL_TABLET | Freq: Every day | ORAL | Status: DC

## 2016-06-02 MED ORDER — DILTIAZEM HCL 30 MG PO TABS
30.0000 mg | ORAL_TABLET | Freq: Three times a day (TID) | ORAL | Status: DC
  Administered 2016-06-02 – 2016-06-04 (×8): 30 mg via ORAL
  Filled 2016-06-02 (×9): qty 1

## 2016-06-02 MED ORDER — MUPIROCIN 2 % EX OINT
1.0000 "application " | TOPICAL_OINTMENT | Freq: Two times a day (BID) | CUTANEOUS | Status: DC
  Administered 2016-06-02 – 2016-06-04 (×5): 1 via NASAL
  Filled 2016-06-02 (×2): qty 22

## 2016-06-02 MED ORDER — PNEUMOCOCCAL VAC POLYVALENT 25 MCG/0.5ML IJ INJ
0.5000 mL | INJECTION | INTRAMUSCULAR | Status: AC
  Administered 2016-06-03: 0.5 mL via INTRAMUSCULAR
  Filled 2016-06-02: qty 0.5

## 2016-06-02 MED ORDER — CHLORHEXIDINE GLUCONATE CLOTH 2 % EX PADS
6.0000 | MEDICATED_PAD | Freq: Every day | CUTANEOUS | Status: DC
  Administered 2016-06-02 – 2016-06-04 (×3): 6 via TOPICAL

## 2016-06-02 NOTE — Procedures (Signed)
Ultrasound-guided therapeutic paracentesis performed yielding 4 liters of yellow colored fluid. No immediate complications.  [name] E 2:51 PM 06/02/2016

## 2016-06-02 NOTE — Progress Notes (Signed)
PROGRESS NOTE    [name]  TFT:[id number] DOB: Jul 15, 1960 DOA: 06/01/2016 PCP: Vic Blackbird, MD     Brief Narrative:  56 y/o man admitted from home on 4/18 due to palpitations and he was found to be in a fib with RVR. Started on cardizem IV and admission requested.   Assessment & Plan:   Principal Problem:   A-fib ([place]) Active Problems:   Hypertension   History of alcohol abuse   Alcoholic hepatitis with ascites   A Fib with RVR -Rate controlled now on cardizem gtt. -Will start on PO cardizem and transition off IV today. -Not a candidate for anticoagulation given his liver dysfunction. -ECHO pending. -Check TSH.  Ascites due to Alcoholic Cirrhosis -Very tense ascites. -Will request therapeutic US-guided paracentesis for today. -Thiamine/folate   DVT prophylaxis: SCDs Code Status: full code Family Communication: patient only Disposition Plan: transfer to floor once off cardizem drip. For paracentesis today. Anticipate DC home in 24 hours.  Consultants:   None  Procedures:   None  Antimicrobials:  Anti-infectives    None       Subjective: Feels well other than discomfort from tense ascites.  Objective: Vitals:   06/02/16 0400 06/02/16 0500 06/02/16 0600 06/02/16 0711  BP: 112/76 (!) 109/94 109/87   Pulse: 87 89 97 86  Resp: 20 19 (!) 28 17  Temp: 98.3 F (36.8 C)   98.8 F (37.1 C)  TempSrc: Oral   Oral  SpO2: 94% 96% 96% 96%  Weight:  109.5 kg (241 lb 6.5 oz)    Height:        Intake/Output Summary (Last 24 hours) at 06/02/16 1003 Last data filed at 06/02/16 0845  Gross per 24 hour  Intake              525 ml  Output              500 ml  Net               25 ml   Filed Weights   06/01/16 1807 06/01/16 2236 06/02/16 0500  Weight: 108.9 kg (240 lb) 109.5 kg (241 lb 6.5 oz) 109.5 kg (241 lb 6.5 oz)    Examination:  General exam: Alert, awake, oriented x 3 Respiratory system: Clear to auscultation. Respiratory effort  normal. Cardiovascular system:RRR. No murmurs, rubs, gallops. Gastrointestinal system: Abdomen is distended and nontender to palpation. Normal bowel sounds heard. Central nervous system: Alert and oriented. No focal neurological deficits. Extremities: No C/C/E, +pedal pulses Skin: No rashes, lesions or ulcers Psychiatry: Judgement and insight appear normal. Mood & affect appropriate.     Data Reviewed: I have personally reviewed following labs and imaging studies  CBC:  Recent Labs Lab 06/01/16 1800 06/02/16 0438  WBC 6.3 5.6  NEUTROABS 3.2  --   HGB 13.1 12.0*  HCT 37.7* 34.0*  MCV 102.7* 101.8*  PLT 117* 706*   Basic Metabolic Panel:  Recent Labs Lab 06/01/16 1800 06/02/16 0438  NA 135 135  K 3.9 3.7  CL 103 104  CO2 24 22  GLUCOSE 122* 113*  BUN 18 16  CREATININE 1.21 0.79  CALCIUM 7.9* 7.7*   GFR: Estimated Creatinine Clearance: 136.2 mL/min (by C-G formula based on SCr of 0.79 mg/dL). Liver Function Tests:  Recent Labs Lab 06/01/16 1800 06/02/16 0438  AST 63* 57*  ALT 25 22  ALKPHOS 151* 144*  BILITOT 1.8* 1.9*  PROT 6.4* 5.8*  ALBUMIN 2.1* 1.9*   No  results for input(s): LIPASE, AMYLASE in the last 168 hours. No results for input(s): AMMONIA in the last 168 hours. Coagulation Profile:  Recent Labs Lab 06/02/16 0438  INR 1.38   Cardiac Enzymes:  Recent Labs Lab 06/01/16 1800  TROPONINI <0.03   BNP (last 3 results) No results for input(s): PROBNP in the last 8760 hours. HbA1C: No results for input(s): HGBA1C in the last 72 hours. CBG: No results for input(s): GLUCAP in the last 168 hours. Lipid Profile: No results for input(s): CHOL, HDL, LDLCALC, TRIG, CHOLHDL, LDLDIRECT in the last 72 hours. Thyroid Function Tests: No results for input(s): TSH, T4TOTAL, FREET4, T3FREE, THYROIDAB in the last 72 hours. Anemia Panel: No results for input(s): VITAMINB12, FOLATE, FERRITIN, TIBC, IRON, RETICCTPCT in the last 72 hours. Urine  analysis:    Component Value Date/Time   COLORURINE AMBER (A) 12/14/2015 1439   APPEARANCEUR CLEAR 12/14/2015 1439   LABSPEC 1.015 12/14/2015 1439   PHURINE 6.0 12/14/2015 1439   GLUCOSEU TRACE (A) 12/14/2015 1439   HGBUR 3+ (A) 12/14/2015 1439   BILIRUBINUR NEGATIVE 12/14/2015 1439   KETONESUR TRACE (A) 12/14/2015 1439   PROTEINUR 1+ (A) 12/14/2015 1439   UROBILINOGEN 1.0 07/26/2014 2231   NITRITE NEGATIVE 12/14/2015 1439   LEUKOCYTESUR NEGATIVE 12/14/2015 1439   Sepsis Labs: @LABRCNTIP (procalcitonin:4,lacticidven:4)  ) Recent Results (from the past 240 hour(s))  MRSA PCR Screening     Status: Abnormal   Collection Time: 06/01/16 10:30 PM  Result Value Ref Range Status   MRSA by PCR POSITIVE (A) NEGATIVE Final    Comment:        The GeneXpert MRSA Assay (FDA approved for NASAL specimens only), is one component of a comprehensive MRSA colonization surveillance program. It is not intended to diagnose MRSA infection nor to guide or monitor treatment for MRSA infections. RESULT CALLED TO, READ BACK BY AND VERIFIED WITH:  SMITH,F @ 0210 ON 06/02/16 BY JUW          Radiology Studies: Dg Chest Portable 1 View  Result Date: 06/01/2016 CLINICAL DATA:  Intermittent palpitations since February. EXAM: PORTABLE CHEST 1 VIEW COMPARISON:  04/01/2016 FINDINGS: Heart size within normal limits. Mediastinal shadows are normal. Lungs are clear. The vascularity is normal. No effusions. No bony abnormalities. IMPRESSION: No active disease. Electronically Signed   By: Nelson Chimes M.D.   On: 06/01/2016 18:37        Scheduled Meds: . aspirin EC  81 mg Oral Daily  . busPIRone  15 mg Oral Daily  . Chlorhexidine Gluconate Cloth  6 each Topical Q0600  . diltiazem  30 mg Oral Q8H  . FLUoxetine  40 mg Oral QHS  . folic acid  1 mg Oral Daily  . hydrochlorothiazide  12.5 mg Oral Daily  . lactulose  15 g Oral BID  . lisinopril  10 mg Oral Daily  . mupirocin ointment  1 application  Nasal BID  . pantoprazole  40 mg Oral Daily  . propranolol  10 mg Oral BID  . spironolactone  25 mg Oral Daily  . sucralfate  1 g Oral TID WC & HS   Continuous Infusions: . diltiazem (CARDIZEM) infusion 5 mg/hr (06/02/16 0600)     LOS: 1 day    Time spent: 25 minutes. Greater than 50% of this time was spent in direct contact with the patient coordinating care.     Lelon Frohlich, MD [facility] Pager [phone]  If 7PM-7AM, please contact night-coverage [email or web address] Password [email or web address] 06/02/2016, 10:03 AM

## 2016-06-02 NOTE — Progress Notes (Signed)
*  PRELIMINARY RESULTS* Echocardiogram 2D Echocardiogram has been performed.  [name] 06/02/2016, 9:22 AM

## 2016-06-03 DIAGNOSIS — I5042 Chronic combined systolic (congestive) and diastolic (congestive) heart failure: Secondary | ICD-10-CM

## 2016-06-03 DIAGNOSIS — I4891 Unspecified atrial fibrillation: Principal | ICD-10-CM

## 2016-06-03 LAB — CBC
HEMATOCRIT: 34.3 % — AB (ref 39.0–52.0)
HEMOGLOBIN: 12 g/dL — AB (ref 13.0–17.0)
MCH: 36 pg — ABNORMAL HIGH (ref 26.0–34.0)
MCHC: 35 g/dL (ref 30.0–36.0)
MCV: 103 fL — ABNORMAL HIGH (ref 78.0–100.0)
Platelets: 92 10*3/uL — ABNORMAL LOW (ref 150–400)
RBC: 3.33 MIL/uL — AB (ref 4.22–5.81)
RDW: 15.8 % — ABNORMAL HIGH (ref 11.5–15.5)
WBC: 6.6 10*3/uL (ref 4.0–10.5)

## 2016-06-03 LAB — BASIC METABOLIC PANEL
ANION GAP: 7 (ref 5–15)
BUN: 15 mg/dL (ref 6–20)
CALCIUM: 7.6 mg/dL — AB (ref 8.9–10.3)
CO2: 25 mmol/L (ref 22–32)
Chloride: 102 mmol/L (ref 101–111)
Creatinine, Ser: 0.94 mg/dL (ref 0.61–1.24)
GLUCOSE: 108 mg/dL — AB (ref 65–99)
POTASSIUM: 3.5 mmol/L (ref 3.5–5.1)
Sodium: 134 mmol/L — ABNORMAL LOW (ref 135–145)

## 2016-06-03 LAB — HIV ANTIBODY (ROUTINE TESTING W REFLEX): HIV SCREEN 4TH GENERATION: NONREACTIVE

## 2016-06-03 MED ORDER — FUROSEMIDE 40 MG PO TABS
40.0000 mg | ORAL_TABLET | Freq: Every day | ORAL | Status: DC
  Administered 2016-06-03 – 2016-06-04 (×2): 40 mg via ORAL
  Filled 2016-06-03 (×2): qty 1

## 2016-06-03 MED ORDER — CARVEDILOL 3.125 MG PO TABS
3.1250 mg | ORAL_TABLET | Freq: Two times a day (BID) | ORAL | Status: DC
  Administered 2016-06-03 – 2016-06-04 (×2): 3.125 mg via ORAL
  Filled 2016-06-03 (×2): qty 1

## 2016-06-03 NOTE — Progress Notes (Signed)
PROGRESS NOTE    [name]  ZWC:[id number] DOB: [birth year] DOA: 06/01/2016 PCP: Vic Blackbird, MD     Brief Narrative:  56 y/o man admitted from home on 4/18 due to palpitations and he was found to be in a fib with RVR. Started on cardizem IV and admission requested. Transitioned to PO cardizem with adequate rate control. ECHO with depressed EF and WMA.   Assessment & Plan:   Principal Problem:   A-fib ([place]) Active Problems:   Hypertension   History of alcohol abuse   Alcoholic hepatitis with ascites   Chronic combined systolic and diastolic CHF (congestive heart failure) (HCC)   A Fib with RVR -Rate controlled now on PO cardizem. -Not a candidate for anticoagulation given his liver dysfunction. -TSH WNL. -ECHO with EF 40-45% and some regional wall motion abnormalities. -Cardiology consultation requested. -DC HCTZ and start lasix. Already on ACE-I. On propranolol for ETOH cirrhosis. -Hypotension limits initiation of other meds.  Ascites due to Alcoholic Cirrhosis -S/p US guided paracentesis on 4/19 with removal of 4 L of ascitic fluid.  DVT prophylaxis: SCDs Code Status: full code Family Communication: patient only Disposition Plan: pending cardiology evaluation.  Consultants:   Cardiology  Procedures:   US-guided paracentesis 4/19.  Antimicrobials:  Anti-infectives    None       Subjective: Feels much improved. No complaints.  Objective: Vitals:   06/03/16 0500 06/03/16 0600 06/03/16 0605 06/03/16 0700  BP:   (!) 73/44   Pulse: 79 72    Resp: 20 19    Temp:    98.7 F (37.1 C)  TempSrc:    Oral  SpO2: 97% 95%    Weight: 105.5 kg (232 lb 9.4 oz)     Height:        Intake/Output Summary (Last 24 hours) at 06/03/16 1000 Last data filed at 06/03/16 0700  Gross per 24 hour  Intake            498.5 ml  Output             1100 ml  Net           -601.5 ml   Filed Weights   06/01/16 2236 06/02/16 0500 06/03/16 0500  Weight: 109.5 kg  (241 lb 6.5 oz) 109.5 kg (241 lb 6.5 oz) 105.5 kg (232 lb 9.4 oz)    Examination:  General exam: Alert, awake, oriented x 3 Respiratory system: Clear to auscultation. Respiratory effort normal. Cardiovascular system:RRR. No murmurs, rubs, gallops. Gastrointestinal system: Abdomen is distended, non tender to palpation. Normal bowel sounds heard. Central nervous system: Alert and oriented. No focal neurological deficits. Extremities: No C/C/E, +pedal pulses Skin: No rashes, lesions or ulcers Psychiatry: Judgement and insight appear normal. Mood & affect appropriate.      Data Reviewed: I have personally reviewed following labs and imaging studies  CBC:  Recent Labs Lab 06/01/16 1800 06/02/16 0438 06/03/16 0437  WBC 6.3 5.6 6.6  NEUTROABS 3.2  --   --   HGB 13.1 12.0* 12.0*  HCT 37.7* 34.0* 34.3*  MCV 102.7* 101.8* 103.0*  PLT 117* 103* 92*   Basic Metabolic Panel:  Recent Labs Lab 06/01/16 1800 06/02/16 0438 06/03/16 0437  NA 135 135 134*  K 3.9 3.7 3.5  CL 103 104 102  CO2 24 22 25   GLUCOSE 122* 113* 108*  BUN 18 16 15   CREATININE 1.21 0.79 0.94  CALCIUM 7.9* 7.7* 7.6*   GFR: Estimated Creatinine Clearance: 113.9 mL/min (by  C-G formula based on SCr of 0.94 mg/dL). Liver Function Tests:  Recent Labs Lab 06/01/16 1800 06/02/16 0438  AST 63* 57*  ALT 25 22  ALKPHOS 151* 144*  BILITOT 1.8* 1.9*  PROT 6.4* 5.8*  ALBUMIN 2.1* 1.9*   No results for input(s): LIPASE, AMYLASE in the last 168 hours. No results for input(s): AMMONIA in the last 168 hours. Coagulation Profile:  Recent Labs Lab 06/02/16 0438  INR 1.38   Cardiac Enzymes:  Recent Labs Lab 06/01/16 1800  TROPONINI <0.03   BNP (last 3 results) No results for input(s): PROBNP in the last 8760 hours. HbA1C: No results for input(s): HGBA1C in the last 72 hours. CBG: No results for input(s): GLUCAP in the last 168 hours. Lipid Profile: No results for input(s): CHOL, HDL, LDLCALC, TRIG,  CHOLHDL, LDLDIRECT in the last 72 hours. Thyroid Function Tests:  Recent Labs  06/02/16 1129  TSH 1.222   Anemia Panel: No results for input(s): VITAMINB12, FOLATE, FERRITIN, TIBC, IRON, RETICCTPCT in the last 72 hours. Urine analysis:    Component Value Date/Time   COLORURINE AMBER (A) 12/14/2015 1439   APPEARANCEUR CLEAR 12/14/2015 1439   LABSPEC 1.015 12/14/2015 1439   PHURINE 6.0 12/14/2015 1439   GLUCOSEU TRACE (A) 12/14/2015 1439   HGBUR 3+ (A) 12/14/2015 1439   BILIRUBINUR NEGATIVE 12/14/2015 1439   KETONESUR TRACE (A) 12/14/2015 1439   PROTEINUR 1+ (A) 12/14/2015 1439   UROBILINOGEN 1.0 07/26/2014 2231   NITRITE NEGATIVE 12/14/2015 1439   LEUKOCYTESUR NEGATIVE 12/14/2015 1439   Sepsis Labs: @LABRCNTIP (procalcitonin:4,lacticidven:4)  ) Recent Results (from the past 240 hour(s))  MRSA PCR Screening     Status: Abnormal   Collection Time: 06/01/16 10:30 PM  Result Value Ref Range Status   MRSA by PCR POSITIVE (A) NEGATIVE Final    Comment:        The GeneXpert MRSA Assay (FDA approved for NASAL specimens only), is one component of a comprehensive MRSA colonization surveillance program. It is not intended to diagnose MRSA infection nor to guide or monitor treatment for MRSA infections. RESULT CALLED TO, READ BACK BY AND VERIFIED WITH:  SMITH,F @ 0210 ON 06/02/16 BY JUW          Radiology Studies: US Paracentesis  Result Date: 06/02/2016 INDICATION: Patient with a history of alcoholic cirrhosis. He is noted to have abdominal ascites. Request is made for therapeutic paracentesis. This is his first paracentesis so he will have a 4 L maximum. EXAM: ULTRASOUND GUIDED THERAPEUTIC PARACENTESIS MEDICATIONS: 1% lidocaine COMPLICATIONS: None immediate. PROCEDURE: Informed written consent was obtained from the patient after a discussion of the risks, benefits and alternatives to treatment. A timeout was performed prior to the initiation of the procedure. Initial  ultrasound scanning demonstrates a large amount of ascites within the right upper abdominal quadrant. The right upper abdomen was prepped and draped in the usual sterile fashion. 1% lidocaine was used for local anesthesia. Following this, a 19 gauge, 7-cm, Yueh catheter was introduced. An ultrasound image was saved for documentation purposes. The paracentesis was performed. The catheter was removed and a dressing was applied. The patient tolerated the procedure well without immediate post procedural complication. FINDINGS: A total of approximately 4 L of yellow fluid was removed. IMPRESSION: Successful ultrasound-guided paracentesis yielding 4 liters of peritoneal fluid. This is has maximum given this is his first paracentesis. Read by: Saverio Danker, PA-C Electronically Signed   By: Monte Fantasia M.D.   On: 06/02/2016 14:53   Dg Chest Portable  1 View  Result Date: 06/01/2016 CLINICAL DATA:  Intermittent palpitations since February. EXAM: PORTABLE CHEST 1 VIEW COMPARISON:  04/01/2016 FINDINGS: Heart size within normal limits. Mediastinal shadows are normal. Lungs are clear. The vascularity is normal. No effusions. No bony abnormalities. IMPRESSION: No active disease. Electronically Signed   By: Nelson Chimes M.D.   On: 06/01/2016 18:37        Scheduled Meds: . aspirin EC  81 mg Oral Daily  . busPIRone  15 mg Oral Daily  . Chlorhexidine Gluconate Cloth  6 each Topical Q0600  . diltiazem  30 mg Oral Q8H  . FLUoxetine  40 mg Oral QHS  . folic acid  1 mg Oral Daily  . furosemide  40 mg Oral Daily  . lactulose  15 g Oral BID  . lisinopril  10 mg Oral Daily  . mupirocin ointment  1 application Nasal BID  . pantoprazole  40 mg Oral Daily  . propranolol  10 mg Oral BID  . spironolactone  25 mg Oral Daily  . sucralfate  1 g Oral TID WC & HS  . thiamine  100 mg Oral Daily   Continuous Infusions:    LOS: 2 days    Time spent: 25 minutes. Greater than 50% of this time was spent in direct  contact with the patient coordinating care.     Lelon Frohlich, MD [facility] Pager [phone]  If 7PM-7AM, please contact night-coverage [email or web address] Password [email or web address] 06/03/2016, 10:00 AM

## 2016-06-03 NOTE — Consult Note (Signed)
Primary cardiologist:N/A Consulting cardiologist:Dr Carlyle Dolly Requesting physician: Dr Isaac Bliss Indication: afib, systolic dysfunction  Clinical Summary Mr. Haack is a 56 y.o.male history of EtOH cirrhosis, history of afib not on anticoag due to history of falls, HTN, prior CVA admitted with palpitations and fluid overload. From available notes prior afib in Feb 2018 during admission to [facility]. He was rate controlled, decision at that time not to anticoagulate due to cirrhosis and history of bleeding ulcers accroding to his pcp's note. PCP notes also mention issues with medication compliance. Admitted with palpitations and fluid overload.      Allergies  Allergen Reactions  . Bee Venom Anaphylaxis and Nausea Only  . Bee Venom Anaphylaxis, Nausea And Vomiting and Swelling    Throat swelling    Medications Scheduled Medications: . aspirin EC  81 mg Oral Daily  . busPIRone  15 mg Oral Daily  . Chlorhexidine Gluconate Cloth  6 each Topical Q0600  . diltiazem  30 mg Oral Q8H  . FLUoxetine  40 mg Oral QHS  . folic acid  1 mg Oral Daily  . furosemide  40 mg Oral Daily  . lactulose  15 g Oral BID  . lisinopril  10 mg Oral Daily  . mupirocin ointment  1 application Nasal BID  . pantoprazole  40 mg Oral Daily  . propranolol  10 mg Oral BID  . spironolactone  25 mg Oral Daily  . sucralfate  1 g Oral TID WC & HS  . thiamine  100 mg Oral Daily     Infusions:   PRN Medications:  acetaminophen **OR** acetaminophen, ondansetron **OR** ondansetron (ZOFRAN) IV   Past Medical History:  Diagnosis Date  . Anxiety   . Chronic back pain   . DDD (degenerative disc disease), lumbosacral   . Depression   . Elevated LFTs    secondary to ETOH  . History of alcohol abuse   . Hx of cardiac cath 1996  . Hyperlipidemia   . Hypertension   . Stroke [facility]) 2003    Past Surgical History:  Procedure Laterality Date  . CARDIAC CATHETERIZATION  1996  . COLONOSCOPY  N/A 03/25/2015   Procedure: COLONOSCOPY;  Surgeon: Rogene Houston, MD;  Location: [facility];  Service: Endoscopy;  Laterality: N/A;  1130  . ESOPHAGOGASTRODUODENOSCOPY N/A 03/25/2015   Procedure: ESOPHAGOGASTRODUODENOSCOPY (EGD);  Surgeon: Rogene Houston, MD;  Location: [facility];  Service: Endoscopy;  Laterality: N/A;    Family History  Problem Relation Age of Onset  . Heart disease Mother   . Cancer Father     throat cancer  . Cancer Brother     melanoma  . Thyroid disease Sister     Social History Mr. Ammon reports that he has never smoked. He has quit using smokeless tobacco. His smokeless tobacco use included Chew. Mr. Gasparini reports that he does not drink alcohol.  Review of Systems CONSTITUTIONAL: No weight loss, fever, chills, weakness or fatigue.  HEENT: Eyes: No visual loss, blurred vision, double vision or yellow sclerae. No hearing loss, sneezing, congestion, runny nose or sore throat.  SKIN: No rash or itching.  CARDIOVASCULAR: + palpitations, no chest pain.  RESPIRATORY: decreased bilateral bases GASTROINTESTINAL: No anorexia, nausea, vomiting or diarrhea. No abdominal pain or blood.  GENITOURINARY: no polyuria, no dysuria NEUROLOGICAL: No headache, dizziness, syncope, paralysis, ataxia, numbness or tingling in the extremities. No change in bowel or bladder control.  MUSCULOSKELETAL: No muscle, back pain, joint pain or  stiffness.  HEMATOLOGIC: No anemia, bleeding or bruising.  LYMPHATICS: No enlarged nodes. No history of splenectomy.  PSYCHIATRIC: No history of depression or anxiety.      Physical Examination Blood pressure (!) 73/44, pulse 72, temperature 98.7 F (37.1 C), temperature source Oral, resp. rate 19, height 6\' 3"  (1.905 m), weight 232 lb 9.4 oz (105.5 kg), SpO2 95 %.  Intake/Output Summary (Last 24 hours) at 06/03/16 1043 Last data filed at 06/03/16 0700  Gross per 24 hour  Intake            498.5 ml  Output             1100 ml  Net            -601.5 ml    HEENT: sclera clear, throat clear  Cardiovascular: irreg, no m/r/g, no jvd  Respiratory: decreased breath sounds bilateral bases  GI: distended, NT  MSK: 1-2+ bilateral LE edema  Neuro: no focal deficits  Psych: appropirate affect   Lab Results  Basic Metabolic Panel:  Recent Labs Lab 06/01/16 1800 06/02/16 0438 06/03/16 0437  NA 135 135 134*  K 3.9 3.7 3.5  CL 103 104 102  CO2 24 22 25   GLUCOSE 122* 113* 108*  BUN 18 16 15   CREATININE 1.21 0.79 0.94  CALCIUM 7.9* 7.7* 7.6*    Liver Function Tests:  Recent Labs Lab 06/01/16 1800 06/02/16 0438  AST 63* 57*  ALT 25 22  ALKPHOS 151* 144*  BILITOT 1.8* 1.9*  PROT 6.4* 5.8*  ALBUMIN 2.1* 1.9*    CBC:  Recent Labs Lab 06/01/16 1800 06/02/16 0438 06/03/16 0437  WBC 6.3 5.6 6.6  NEUTROABS 3.2  --   --   HGB 13.1 12.0* 12.0*  HCT 37.7* 34.0* 34.3*  MCV 102.7* 101.8* 103.0*  PLT 117* 103* 92*    Cardiac Enzymes:  Recent Labs Lab 06/01/16 1800  TROPONINI <0.03    BNP: Invalid input(s): POCBNP    Impression/Recommendations  1. Afib with RVR - unknown exact duration of afib, was noted a few months during admission to [facility]. According to notes no anticoag at that time due to cirrhosis and history of bleeding ulcers - currently on oral dilt 30mg  q8 hrs, as well as propanolol 10mg  bid.  - he is not a candidate for cardioversion given he is poor candidate for anticoag   2. Systolic heart failure - unknown duration of systolic dysfunction. Echo this admit show LVEF 40-45%, abnormal diastolic function, hypokinesis of the basal-mid anteroseptal walls. Severe biatrial enlargement.  - presented with volume overload in setting of systolic HF and liver cirhosis with ascites - s/p paracentesis yesterday with 4L removed - I/Os are incomplete. Currently on lasix 40mg  daily.  - in setting of systolic dysfunction would recommend alternative beta blocker. From literature review  carvedilol is acceptable alternative to propanolol in setting of varices, and would be much more favored for rate control and management of systolic dysfunction. Will need to discuss with GI if ok to make change.  - would focus on medical therapy and rate control at this time, particularly given the patients issues with medication compliance. If complianct and no improvement in LVEF over time could consider ischemic testing at that time.   Carlyle Dolly, M.D.

## 2016-06-04 LAB — CBC
HCT: 37.7 % — ABNORMAL LOW (ref 39.0–52.0)
Hemoglobin: 13.1 g/dL (ref 13.0–17.0)
MCH: 35.7 pg — ABNORMAL HIGH (ref 26.0–34.0)
MCHC: 34.7 g/dL (ref 30.0–36.0)
MCV: 102.7 fL — ABNORMAL HIGH (ref 78.0–100.0)
Platelets: 89 10*3/uL — ABNORMAL LOW (ref 150–400)
RBC: 3.67 MIL/uL — AB (ref 4.22–5.81)
RDW: 15.3 % (ref 11.5–15.5)
WBC: 5.9 10*3/uL (ref 4.0–10.5)

## 2016-06-04 LAB — COMPREHENSIVE METABOLIC PANEL
ALBUMIN: 1.8 g/dL — AB (ref 3.5–5.0)
ALT: 22 U/L (ref 17–63)
AST: 54 U/L — AB (ref 15–41)
Alkaline Phosphatase: 151 U/L — ABNORMAL HIGH (ref 38–126)
Anion gap: 8 (ref 5–15)
BUN: 12 mg/dL (ref 6–20)
CHLORIDE: 101 mmol/L (ref 101–111)
CO2: 26 mmol/L (ref 22–32)
CREATININE: 0.89 mg/dL (ref 0.61–1.24)
Calcium: 7.7 mg/dL — ABNORMAL LOW (ref 8.9–10.3)
GFR calc Af Amer: >60 mL/min (ref >60)
GFR calc non Af Amer: >60 mL/min (ref >60)
GLUCOSE: 110 mg/dL — AB (ref 65–99)
POTASSIUM: 3.6 mmol/L (ref 3.5–5.1)
SODIUM: 135 mmol/L (ref 135–145)
Total Bilirubin: 2.2 mg/dL — ABNORMAL HIGH (ref 0.3–1.2)
Total Protein: 6 g/dL — ABNORMAL LOW (ref 6.5–8.1)

## 2016-06-04 MED ORDER — LISINOPRIL 10 MG PO TABS: 10.0000 mg | ORAL_TABLET | Freq: Every day | ORAL | 2 refills | Status: DC

## 2016-06-04 MED ORDER — FUROSEMIDE 40 MG PO TABS: 40.0000 mg | ORAL_TABLET | Freq: Every day | ORAL | 2 refills | Status: DC

## 2016-06-04 MED ORDER — CARVEDILOL 3.125 MG PO TABS: 3.1250 mg | ORAL_TABLET | Freq: Two times a day (BID) | ORAL | 2 refills | Status: DC

## 2016-06-04 MED ORDER — DILTIAZEM HCL 30 MG PO TABS
30.0000 mg | ORAL_TABLET | Freq: Three times a day (TID) | ORAL | 2 refills | Status: DC
Start: 2016-06-04 — End: 2016-07-05

## 2016-06-04 MED ORDER — THIAMINE HCL 100 MG PO TABS: 100.0000 mg | ORAL_TABLET | Freq: Every day | ORAL | Status: DC

## 2016-06-04 NOTE — Discharge Summary (Signed)
Physician Discharge Summary  KELLYN MANSFIELD WUJ:[id number] DOB: Dec 21, 1960 DOA: 06/01/2016  PCP: Vic Blackbird, MD  Admit date: 06/01/2016 Discharge date: 06/04/2016  Time spent: 45 minutes  Recommendations for Outpatient Follow-up:  -Will be discharged home today. -Advised to follow up with PCP in 2 weeks.   Discharge Diagnoses:  Principal Problem:   A-fib [facility]) Active Problems:   Hypertension   History of alcohol abuse   Alcoholic hepatitis with ascites   Chronic combined systolic and diastolic CHF (congestive heart failure) ([place])   Discharge Condition: Stable and improved  Filed Weights   06/02/16 0500 06/03/16 0500 06/03/16 1817  Weight: 109.5 kg (241 lb 6.5 oz) 105.5 kg (232 lb 9.4 oz) 105.6 kg (232 lb 12.9 oz)    History of present illness:  As per Dr. Marin Comment on 4/18: Darrol Poke is an 56 y.o. male with hx of alcoholic cirrhosis, not actively drinking at this time, hx of afib not on anticoagulation due to fall and alcohol risk, anxiety, HTN, CVA, prior MI, presented to the ER as he was feeling palpitation, and found to be in afib with RVR.  He was given IV cardiazem bolus, and started on the drip with controlled HR.  He has tense ascites as well.  Work up included normal WBC, normal Hb, and platelet count of 117K.  EKG showed afib with RVR.   He has no CP, SOB, lightheadedness, fever, chills, or abdominal pain.  Hospitalist was asked to admit him for afib with RVR.   Hospital Course:   A Fib with RVR -Rate controlled now on PO cardizem. -Not a candidate for anticoagulation given his liver dysfunction. -TSH WNL. -ECHO with EF 40-45% and some regional wall motion abnormalities. -Has been seen by cardiology, they have recommended DC propranolol and start coreg (I agree) -DC HCTZ and start lasix. Already on ACE-I.   Ascites due to Alcoholic Cirrhosis -S/p US guided paracentesis on 4/19 with removal of 4 L of ascitic fluid. -Thiamine/folate  Procedures:  US  guided paracentesis   Consultations:  Cardiology  Discharge Instructions  Discharge Instructions    Diet - low sodium heart healthy    Complete by:  As directed    Increase activity slowly    Complete by:  As directed      Allergies as of 06/04/2016      Reactions   Bee Venom Anaphylaxis, Nausea Only   Bee Venom Anaphylaxis, Nausea And Vomiting, Swelling   Throat swelling      Medication List    STOP taking these medications   cyclobenzaprine 10 MG tablet Commonly known as:  FLEXERIL   lisinopril-hydrochlorothiazide 10-12.5 MG tablet Commonly known as:  PRINZIDE,ZESTORETIC Replaced by:  lisinopril 10 MG tablet   propranolol 10 MG tablet Commonly known as:  INDERAL     TAKE these medications   busPIRone 15 MG tablet Commonly known as:  BUSPAR Take 1 tablet by mouth daily.   carvedilol 3.125 MG tablet Commonly known as:  COREG Take 1 tablet (3.125 mg total) by mouth 2 (two) times daily with a meal.   diltiazem 30 MG tablet Commonly known as:  CARDIZEM Take 1 tablet (30 mg total) by mouth every 8 (eight) hours.   EPINEPHrine 0.3 mg/0.3 mL Soaj injection Commonly known as:  EPI-PEN Inject 0.3 mLs (0.3 mg total) into the muscle once.   FLUoxetine 40 MG capsule Commonly known as:  PROZAC Take 1 capsule (40 mg total) by mouth at bedtime.  folic acid 1 MG tablet Commonly known as:  FOLVITE Take 1 tablet (1 mg total) by mouth daily.   furosemide 40 MG tablet Commonly known as:  LASIX Take 1 tablet (40 mg total) by mouth daily. Start taking on:  06/05/2016   lactulose 10 GM/15ML solution Commonly known as:  CHRONULAC Take 22.5 mLs (15 g total) by mouth 2 (two) times daily.   lisinopril 10 MG tablet Commonly known as:  PRINIVIL,ZESTRIL Take 1 tablet (10 mg total) by mouth daily. Start taking on:  06/05/2016 Replaces:  lisinopril-hydrochlorothiazide 10-12.5 MG tablet   multivitamin with minerals Tabs tablet Take 1 tablet by mouth daily. Centrum     omeprazole 20 MG capsule Commonly known as:  PRILOSEC Take 1 capsule by mouth 2 (two) times daily.   ranitidine 150 MG capsule Commonly known as:  ZANTAC Take 1 capsule (150 mg total) by mouth 2 (two) times daily.   spironolactone 25 MG tablet Commonly known as:  ALDACTONE TAKE 1 TABLET BY MOUTH DAILY   sucralfate 1 GM/10ML suspension Commonly known as:  CARAFATE Take 10 mLs (1 g total) by mouth 4 (four) times daily -  with meals and at bedtime.   thiamine 100 MG tablet Take 1 tablet (100 mg total) by mouth daily. Start taking on:  06/05/2016      Allergies  Allergen Reactions  . Bee Venom Anaphylaxis and Nausea Only  . Bee Venom Anaphylaxis, Nausea And Vomiting and Swelling    Throat swelling   Follow-up Information    Vic Blackbird, MD. Schedule an appointment as soon as possible for a visit in 2 week(s).   Specialty:  Family Medicine Contact information: [address] [place] [address] [phone]            The results of significant diagnostics from this hospitalization (including imaging, microbiology, ancillary and laboratory) are listed below for reference.    Significant Diagnostic Studies: US Paracentesis  Result Date: 06/02/2016 INDICATION: Patient with a history of alcoholic cirrhosis. He is noted to have abdominal ascites. Request is made for therapeutic paracentesis. This is his first paracentesis so he will have a 4 L maximum. EXAM: ULTRASOUND GUIDED THERAPEUTIC PARACENTESIS MEDICATIONS: 1% lidocaine COMPLICATIONS: None immediate. PROCEDURE: Informed written consent was obtained from the patient after a discussion of the risks, benefits and alternatives to treatment. A timeout was performed prior to the initiation of the procedure. Initial ultrasound scanning demonstrates a large amount of ascites within the right upper abdominal quadrant. The right upper abdomen was prepped and draped in the usual sterile fashion. 1% lidocaine was used for  local anesthesia. Following this, a 19 gauge, 7-cm, Yueh catheter was introduced. An ultrasound image was saved for documentation purposes. The paracentesis was performed. The catheter was removed and a dressing was applied. The patient tolerated the procedure well without immediate post procedural complication. FINDINGS: A total of approximately 4 L of yellow fluid was removed. IMPRESSION: Successful ultrasound-guided paracentesis yielding 4 liters of peritoneal fluid. This is has maximum given this is his first paracentesis. Read by: Saverio Danker, PA-C Electronically Signed   By: Monte Fantasia M.D.   On: 06/02/2016 14:53   Dg Chest Portable 1 View  Result Date: 06/01/2016 CLINICAL DATA:  Intermittent palpitations since February. EXAM: PORTABLE CHEST 1 VIEW COMPARISON:  04/01/2016 FINDINGS: Heart size within normal limits. Mediastinal shadows are normal. Lungs are clear. The vascularity is normal. No effusions. No bony abnormalities. IMPRESSION: No active disease. Electronically Signed  By: Nelson Chimes M.D.   On: 06/01/2016 18:37    Microbiology: Recent Results (from the past 240 hour(s))  MRSA PCR Screening     Status: Abnormal   Collection Time: 06/01/16 10:30 PM  Result Value Ref Range Status   MRSA by PCR POSITIVE (A) NEGATIVE Final    Comment:        The GeneXpert MRSA Assay (FDA approved for NASAL specimens only), is one component of a comprehensive MRSA colonization surveillance program. It is not intended to diagnose MRSA infection nor to guide or monitor treatment for MRSA infections. RESULT CALLED TO, READ BACK BY AND VERIFIED WITH:  SMITH,F @ 0210 ON 06/02/16 BY JUW      Labs: Basic Metabolic Panel:  Recent Labs Lab 06/01/16 1800 06/02/16 0438 06/03/16 0437 06/04/16 0649  NA 135 135 134* 135  K 3.9 3.7 3.5 3.6  CL 103 104 102 101  CO2 24 22 25 26   GLUCOSE 122* 113* 108* 110*  BUN 18 16 15 12   CREATININE 1.21 0.79 0.94 0.89  CALCIUM 7.9* 7.7* 7.6* 7.7*    Liver Function Tests:  Recent Labs Lab 06/01/16 1800 06/02/16 0438 06/04/16 0649  AST 63* 57* 54*  ALT 25 22 22   ALKPHOS 151* 144* 151*  BILITOT 1.8* 1.9* 2.2*  PROT 6.4* 5.8* 6.0*  ALBUMIN 2.1* 1.9* 1.8*   No results for input(s): LIPASE, AMYLASE in the last 168 hours. No results for input(s): AMMONIA in the last 168 hours. CBC:  Recent Labs Lab 06/01/16 1800 06/02/16 0438 06/03/16 0437 06/04/16 0649  WBC 6.3 5.6 6.6 5.9  NEUTROABS 3.2  --   --   --   HGB 13.1 12.0* 12.0* 13.1  HCT 37.7* 34.0* 34.3* 37.7*  MCV 102.7* 101.8* 103.0* 102.7*  PLT 117* 103* 92* 89*   Cardiac Enzymes:  Recent Labs Lab 06/01/16 1800  TROPONINI <0.03   BNP: BNP (last 3 results) No results for input(s): BNP in the last 8760 hours.  ProBNP (last 3 results) No results for input(s): PROBNP in the last 8760 hours.  CBG: No results for input(s): GLUCAP in the last 168 hours.     SignedLelon Frohlich  [facility] Pager: [phone] 06/04/2016, 5:36 PM

## 2016-06-04 NOTE — Progress Notes (Signed)
Pt discharged home today per Dr. Jerilee Hoh.  Pt's IV site D/C'd and WDL.  Pt's VSS.  Pt provided with home medication list, discharge instructions and prescriptions.  Verbalized understanding.  Pt ambulated off floor in stable condition accompanied by Nursing Student.

## 2016-06-29 ENCOUNTER — Other Ambulatory Visit: Payer: Self-pay | Admitting: Family Medicine

## 2016-07-05 ENCOUNTER — Encounter: Payer: Self-pay | Admitting: Cardiology

## 2016-07-05 ENCOUNTER — Ambulatory Visit (INDEPENDENT_AMBULATORY_CARE_PROVIDER_SITE_OTHER): Payer: Medicare Other | Admitting: Cardiology

## 2016-07-05 ENCOUNTER — Other Ambulatory Visit (HOSPITAL_COMMUNITY)
Admission: RE | Admit: 2016-07-05 | Discharge: 2016-07-05 | Disposition: A | Discharge status: Home / Self Care | Payer: Medicare Other | Source: Ambulatory Visit | Attending: Cardiology | Admitting: Cardiology

## 2016-07-05 VITALS — BP 94/60 | HR 69 | Ht 74.0 in | Wt 219.0 lb

## 2016-07-05 DIAGNOSIS — K7469 Other cirrhosis of liver: Secondary | ICD-10-CM | POA: Diagnosis not present

## 2016-07-05 DIAGNOSIS — Z79899 Other long term (current) drug therapy: Secondary | ICD-10-CM | POA: Insufficient documentation

## 2016-07-05 DIAGNOSIS — I5022 Chronic systolic (congestive) heart failure: Secondary | ICD-10-CM | POA: Diagnosis not present

## 2016-07-05 DIAGNOSIS — I4891 Unspecified atrial fibrillation: Secondary | ICD-10-CM | POA: Diagnosis not present

## 2016-07-05 LAB — CBC WITH DIFFERENTIAL/PLATELET
BASOS ABS: 0.1 10*3/uL (ref 0.0–0.1)
Basophils Relative: 1 %
EOS PCT: 2 %
Eosinophils Absolute: 0.2 10*3/uL (ref 0.0–0.7)
HEMATOCRIT: 38 % — AB (ref 39.0–52.0)
Hemoglobin: 13.3 g/dL (ref 13.0–17.0)
LYMPHS ABS: 1.5 10*3/uL (ref 0.7–4.0)
LYMPHS PCT: 23 %
MCH: 35.7 pg — AB (ref 26.0–34.0)
MCHC: 35 g/dL (ref 30.0–36.0)
MCV: 101.9 fL — AB (ref 78.0–100.0)
MONO ABS: 0.7 10*3/uL (ref 0.1–1.0)
Monocytes Relative: 11 %
NEUTROS ABS: 4 10*3/uL (ref 1.7–7.7)
Neutrophils Relative %: 63 %
PLATELETS: 105 10*3/uL — AB (ref 150–400)
RBC: 3.73 MIL/uL — AB (ref 4.22–5.81)
RDW: 14.6 % (ref 11.5–15.5)
WBC: 6.3 10*3/uL (ref 4.0–10.5)

## 2016-07-05 LAB — COMPREHENSIVE METABOLIC PANEL
ALBUMIN: 2.3 g/dL — AB (ref 3.5–5.0)
ALT: 19 U/L (ref 17–63)
ANION GAP: 7 (ref 5–15)
AST: 46 U/L — AB (ref 15–41)
Alkaline Phosphatase: 144 U/L — ABNORMAL HIGH (ref 38–126)
BUN: 12 mg/dL (ref 6–20)
CO2: 26 mmol/L (ref 22–32)
Calcium: 8.2 mg/dL — ABNORMAL LOW (ref 8.9–10.3)
Chloride: 102 mmol/L (ref 101–111)
Creatinine, Ser: 1.2 mg/dL (ref 0.61–1.24)
GFR calc Af Amer: >60 mL/min (ref >60)
GFR calc non Af Amer: >60 mL/min (ref >60)
GLUCOSE: 111 mg/dL — AB (ref 65–99)
POTASSIUM: 3.6 mmol/L (ref 3.5–5.1)
SODIUM: 135 mmol/L (ref 135–145)
TOTAL PROTEIN: 6.9 g/dL (ref 6.5–8.1)
Total Bilirubin: 2.2 mg/dL — ABNORMAL HIGH (ref 0.3–1.2)

## 2016-07-05 LAB — MAGNESIUM: Magnesium: 1.5 mg/dL — ABNORMAL LOW (ref 1.7–2.4)

## 2016-07-05 MED ORDER — LISINOPRIL 5 MG PO TABS: 5.0000 mg | ORAL_TABLET | Freq: Every day | ORAL | 3 refills | Status: DC

## 2016-07-05 MED ORDER — CARVEDILOL 6.25 MG PO TABS: 6.2500 mg | ORAL_TABLET | Freq: Two times a day (BID) | ORAL | 3 refills | Status: DC

## 2016-07-05 NOTE — Progress Notes (Signed)
Clinical Summary [name] is a 56 y.o.male seen today for follow up of the following medical problems.   1. Afib - not on anticoag due to history of falls, cirrhosis, and history of bleeding ulcers - admit 05/2016 with afib with RVR and fluid overload - can have some palpitations at times    2. EtOH cirrhisos - followed by GI  3. HTN - compliant with meds  4. Chronic sytolic HF - noted during 05/2016 admission, LVEF 40-45%. - presented volume overloaded in setting of afib with RVR, as well as cirrhosis with ascites. Had 4L paracentesis - weight down from 232 to 219 lbs today.  - occasional lightheadness/dizziness.  Past Medical History:  Diagnosis Date  . Anxiety   . Chronic back pain   . DDD (degenerative disc disease), lumbosacral   . Depression   . Elevated LFTs    secondary to ETOH  . History of alcohol abuse   . Hx of cardiac cath 1996  . Hyperlipidemia   . Hypertension   . Stroke [facility]) 2003     Allergies  Allergen Reactions  . Bee Venom Anaphylaxis and Nausea Only  . Bee Venom Anaphylaxis, Nausea And Vomiting and Swelling    Throat swelling     Current Outpatient Prescriptions  Medication Sig Dispense Refill  . busPIRone (BUSPAR) 15 MG tablet Take 1 tablet by mouth daily.    . carvedilol (COREG) 3.125 MG tablet Take 1 tablet (3.125 mg total) by mouth 2 (two) times daily with a meal. 60 tablet 2  . diltiazem (CARDIZEM) 30 MG tablet Take 1 tablet (30 mg total) by mouth every 8 (eight) hours. 90 tablet 2  . EPINEPHrine 0.3 mg/0.3 mL IJ SOAJ injection Inject 0.3 mLs (0.3 mg total) into the muscle once. 1 Device 1  . FLUoxetine (PROZAC) 40 MG capsule Take 1 capsule (40 mg total) by mouth at bedtime. 30 capsule 6  . folic acid (FOLVITE) 1 MG tablet Take 1 tablet (1 mg total) by mouth daily. 30 tablet 1  . furosemide (LASIX) 40 MG tablet Take 1 tablet (40 mg total) by mouth daily. 30 tablet 2  . lactulose (CHRONULAC) 10 GM/15ML solution TAKE 22.5ML BY  MOUTH TWICE DAILY 473 mL 1  . lisinopril (PRINIVIL,ZESTRIL) 10 MG tablet Take 1 tablet (10 mg total) by mouth daily. 30 tablet 2  . Multiple Vitamin (MULTIVITAMIN WITH MINERALS) TABS tablet Take 1 tablet by mouth daily. Centrum    . omeprazole (PRILOSEC) 20 MG capsule Take 1 capsule by mouth 2 (two) times daily.    . ranitidine (ZANTAC) 150 MG capsule Take 1 capsule (150 mg total) by mouth 2 (two) times daily. 60 capsule 3  . spironolactone (ALDACTONE) 25 MG tablet TAKE 1 TABLET BY MOUTH DAILY 30 tablet 0  . sucralfate (CARAFATE) 1 GM/10ML suspension Take 10 mLs (1 g total) by mouth 4 (four) times daily -  with meals and at bedtime. 420 mL 1  . thiamine 100 MG tablet Take 1 tablet (100 mg total) by mouth daily.     No current facility-administered medications for this visit.      Past Surgical History:  Procedure Laterality Date  . CARDIAC CATHETERIZATION  1996  . COLONOSCOPY N/A 03/25/2015   Procedure: COLONOSCOPY;  Surgeon: Rogene Houston, MD;  Location: [facility] ENDO [facility];  Service: Endoscopy;  Laterality: N/A;  1130  . ESOPHAGOGASTRODUODENOSCOPY N/A 03/25/2015   Procedure: ESOPHAGOGASTRODUODENOSCOPY (EGD);  Surgeon: Rogene Houston, MD;  Location: [facility]  ENDO SUITE;  Service: Endoscopy;  Laterality: N/A;     Allergies  Allergen Reactions  . Bee Venom Anaphylaxis and Nausea Only  . Bee Venom Anaphylaxis, Nausea And Vomiting and Swelling    Throat swelling      Family History  Problem Relation Age of Onset  . Heart disease Mother   . Cancer Father        throat cancer  . Cancer Brother        melanoma  . Thyroid disease Sister      Social History Mr. Flippen reports that he has never smoked. He has quit using smokeless tobacco. His smokeless tobacco use included Chew. Mr. Class reports that he does not drink alcohol.   Review of Systems CONSTITUTIONAL: No weight loss, fever, chills, weakness or fatigue.  HEENT: Eyes: No visual loss, blurred vision, double vision or yellow  sclerae.No hearing loss, sneezing, congestion, runny nose or sore throat.  SKIN: No rash or itching.  CARDIOVASCULAR: no chest pain RESPIRATORY: No shortness of breath, cough or sputum.  GASTROINTESTINAL: No anorexia, nausea, vomiting or diarrhea. No abdominal pain or blood.  GENITOURINARY: No burning on urination, no polyuria NEUROLOGICAL: No headache, dizziness, syncope, paralysis, ataxia, numbness or tingling in the extremities. No change in bowel or bladder control.  MUSCULOSKELETAL: No muscle, back pain, joint pain or stiffness.  LYMPHATICS: No enlarged nodes. No history of splenectomy.  PSYCHIATRIC: No history of depression or anxiety.  ENDOCRINOLOGIC: No reports of sweating, cold or heat intolerance. No polyuria or polydipsia.  Marland Kitchen   Physical Examination Vitals:   07/05/16 1422  BP: 94/60  Pulse: 69   Vitals:   07/05/16 1422  Weight: 219 lb (99.3 kg)  Height: 6\' 2"  (1.88 m)    Gen: resting comfortably, no acute distress HEENT: no scleral icterus, pupils equal round and reactive, no palptable cervical adenopathy,  CV: irreg, no m/r/g, no jvd Resp: Clear to auscultation bilaterally GI: abdomen is soft, non-tender, non-distended, normal bowel sounds, no hepatosplenomegaly MSK: extremities are warm, no edema.  Skin: warm, no rash Neuro:  no focal deficits Psych: appropriate affect   Assessment and Plan  1. Afib - not on anticoag as described above - some recent palpitations, we will increase coreg to 6.25mg  bid.  - continue current meds  2. Chronic sysotlic HF - medical therapy limited by soft bp's - no recent symptoms. Increase coreg as described above, lower lisinopril to 2.5mg  daily to allow room with bp - repeat labs on diuretics  3. Cirrhosis - f/u with GI      Arnoldo Lenis, M.D.

## 2016-07-05 NOTE — Patient Instructions (Addendum)
Medication Instructions:  STOP DILTIAZEM INCREASE COREG TO 6.25 MG - TWO TIMES DAILY  DECREASE LISINOPRIL TO 5 MG DAILY   Labwork: TODAY  CBC MAGNESIUM  CMET  Testing/Procedures: NONE  Follow-Up: Your physician recommends that you schedule a follow-up appointment in: 2 MONTHS    Any Other Special Instructions Will Be Listed Below (If Applicable).  You have been referred to [facility]. [facility]     If you need a refill on your cardiac medications before your next appointment, please call your pharmacy.

## 2016-07-06 ENCOUNTER — Encounter: Payer: Self-pay | Admitting: Family Medicine

## 2016-07-06 ENCOUNTER — Ambulatory Visit (INDEPENDENT_AMBULATORY_CARE_PROVIDER_SITE_OTHER): Payer: Medicare Other | Admitting: Family Medicine

## 2016-07-06 VITALS — BP 122/72 | HR 80 | Temp 98.1°F | Resp 14 | Ht 74.0 in | Wt 220.0 lb

## 2016-07-06 DIAGNOSIS — K7031 Alcoholic cirrhosis of liver with ascites: Secondary | ICD-10-CM | POA: Diagnosis not present

## 2016-07-06 DIAGNOSIS — I5042 Chronic combined systolic (congestive) and diastolic (congestive) heart failure: Secondary | ICD-10-CM | POA: Diagnosis not present

## 2016-07-06 DIAGNOSIS — K7011 Alcoholic hepatitis with ascites: Secondary | ICD-10-CM

## 2016-07-06 DIAGNOSIS — F32A Depression, unspecified: Secondary | ICD-10-CM

## 2016-07-06 DIAGNOSIS — I482 Chronic atrial fibrillation, unspecified: Secondary | ICD-10-CM

## 2016-07-06 DIAGNOSIS — F419 Anxiety disorder, unspecified: Secondary | ICD-10-CM

## 2016-07-06 DIAGNOSIS — I10 Essential (primary) hypertension: Secondary | ICD-10-CM

## 2016-07-06 DIAGNOSIS — J301 Allergic rhinitis due to pollen: Secondary | ICD-10-CM | POA: Diagnosis not present

## 2016-07-06 DIAGNOSIS — F329 Major depressive disorder, single episode, unspecified: Secondary | ICD-10-CM | POA: Diagnosis not present

## 2016-07-06 MED ORDER — FLUOXETINE HCL 20 MG PO TABS: 20.0000 mg | ORAL_TABLET | Freq: Every day | ORAL | 3 refills | Status: AC

## 2016-07-06 MED ORDER — CARVEDILOL 6.25 MG PO TABS: 6.2500 mg | ORAL_TABLET | Freq: Two times a day (BID) | ORAL | 3 refills | Status: AC

## 2016-07-06 MED ORDER — LISINOPRIL 5 MG PO TABS: 5.0000 mg | ORAL_TABLET | Freq: Every day | ORAL | 3 refills | Status: DC

## 2016-07-06 MED ORDER — BUSPIRONE HCL 7.5 MG PO TABS: 7.5000 mg | ORAL_TABLET | Freq: Two times a day (BID) | ORAL | 3 refills | Status: AC

## 2016-07-06 MED ORDER — OMEPRAZOLE 20 MG PO CPDR: 20.0000 mg | DELAYED_RELEASE_CAPSULE | Freq: Two times a day (BID) | ORAL | 3 refills | Status: AC

## 2016-07-06 MED ORDER — SPIRONOLACTONE 25 MG PO TABS: 25.0000 mg | ORAL_TABLET | Freq: Every day | ORAL | 3 refills | Status: DC

## 2016-07-06 NOTE — Assessment & Plan Note (Signed)
He still has a good amount of distension  Will set up with GI ASAP for orders regarding continued paracentesis

## 2016-07-06 NOTE — Progress Notes (Signed)
Subjective:    Patient ID: [name], male    DOB: 1960-11-01, 56 y.o.   MRN: [id number]  Patient presents for Hospital F/U   Pt here for hospital f/u, at our last visit sent to ED due to A fibwith RVR fluid overload, cirrhosis with ascites. He had 4L removed via paracentesis weight down 20lbs. Also on diuretics for CHF combined in settinf of the cirrhosis. He still has fluid on his abdomen he would like drawn off, needs GI intervention.  A fib, he is still in A fib, high risk for cardioversion and he can not be anti-coagulated due to his liver disease. Seen by cardiology yesterday had medication changes which he has not changed and seems very confused about  He is still concerned about having his umbilical hernia repaired- Advised need to hold off until asicites is gone   Anxiety/depression- he would like to restart his prozac and buspar as well  Reviewed labs from yesterday   Mild allergies not sure what to take, sneezing, runny nose    Review Of Systems:  GEN- +fatigue, fever, weight loss,weakness, recent illness HEENT- denies eye drainage, change in vision, +nasal discharge, CVS- denies chest pain, palpitations RESP- denies SOB, cough, wheeze ABD- denies N/V, change in stools, abd pain GU- denies dysuria, hematuria, dribbling, incontinence MSK- denies joint pain, muscle aches, injury Neuro- denies headache, dizziness, syncope, seizure activity       Objective:    BP 122/72   Pulse 80   Temp 98.1 F (36.7 C) (Oral)   Resp 14   Ht 6\' 2"  (1.88 m)   Wt 220 lb (99.8 kg)   SpO2 97%   BMI 28.25 kg/m  GEN- NAD, alert and oriented x3 HEENT- PERRL, EOMI, non injected sclera, pink conjunctiva, MMM, oropharynx clear CVS- irregular rhythem, rate normal RESP-CTAB ABD-NABS,soft,distended  ,  Mild TTP over umbilical hernia EXT- trace edema LE  Psych- normal affect and mood  Pulses- Radial  2+       Assessment & Plan:      Problem List Items Addressed This Visit     Essential hypertension - Primary   Relevant Medications   carvedilol (COREG) 6.25 MG tablet   lisinopril (PRINIVIL,ZESTRIL) 5 MG tablet   spironolactone (ALDACTONE) 25 MG tablet   Cirrhosis of liver with ascites (HCC)    He still has a good amount of distension  Will set up with GI ASAP for orders regarding continued paracentesis      Chronic combined systolic and diastolic CHF (congestive heart failure) ([place])    TIme spent with pharmacy while pt at visit Adjusting medications and updating medication list increae Coreg to 6.25mg  BID, decrease lisinopril to 5mg  D/C Cardizem Continue lasix and aldactone      Relevant Medications   carvedilol (COREG) 6.25 MG tablet   lisinopril (PRINIVIL,ZESTRIL) 5 MG tablet   spironolactone (ALDACTONE) 25 MG tablet   Anxiety and depression    Restart prozac at 20mg  Buspar 7.5mg  BID for anxiety No benzos      A-fib (HCC)   Relevant Medications   carvedilol (COREG) 6.25 MG tablet   lisinopril (PRINIVIL,ZESTRIL) 5 MG tablet   spironolactone (ALDACTONE) 25 MG tablet    Other Visit Diagnoses    Alcoholic hepatitis with ascites       Seasonal allergic rhinitis due to pollen       claritin OTC      Note: This dictation was prepared with Dragon dictation along with smaller phrase technology.  Any transcriptional errors that result from this process are unintentional.

## 2016-07-06 NOTE — Assessment & Plan Note (Signed)
Restart prozac at 20mg  Buspar 7.5mg  BID for anxiety No benzos

## 2016-07-06 NOTE — Assessment & Plan Note (Signed)
TIme spent with pharmacy while pt at visit Adjusting medications and updating medication list increae Coreg to 6.25mg  BID, decrease lisinopril to 5mg  D/C Cardizem Continue lasix and aldactone

## 2016-07-06 NOTE — Patient Instructions (Addendum)
Take 1/2 tablet of the lisinopril 10mg   Take the 2 of the Coreg 3.125mg  twice a day until you run out then get the new prescription STOP THE DILTIZEM Restart your depression and anxiety medication Referral to Dr. [name] Take claritin for allergies  F/U 3 months

## 2016-07-07 ENCOUNTER — Telehealth: Payer: Self-pay | Admitting: *Deleted

## 2016-07-07 NOTE — Telephone Encounter (Signed)
Patient aware. States that he cannot go on Tuesday. Will call to re-schedule his appointment.

## 2016-07-07 NOTE — Telephone Encounter (Signed)
Call placed to [facility], Dr. Olevia Perches office.   Earliest available appointment 07/12/2016 @ 11:15am with Deberah Castle, NP.   Call placed to patient. [name].

## 2016-07-07 NOTE — Telephone Encounter (Signed)
GI appt for Liver ASAP  Received: Yesterday  Message Contents  [place], Modena Nunnery, MD  Belicia Difatta, Eden Lathe, LPN

## 2016-07-12 ENCOUNTER — Ambulatory Visit (INDEPENDENT_AMBULATORY_CARE_PROVIDER_SITE_OTHER): Payer: Medicare Other | Admitting: Internal Medicine

## 2016-07-12 ENCOUNTER — Telehealth: Payer: Self-pay

## 2016-07-12 MED ORDER — FUROSEMIDE 40 MG PO TABS: ORAL_TABLET | ORAL | 3 refills | Status: DC

## 2016-07-12 NOTE — Telephone Encounter (Signed)
-----   Message from Arnoldo Lenis, MD sent at 07/08/2016  4:00 PM EDT ----- Labs show mild decrease in kidney function. Would have him change his lasix to 40mg  alternating with 20mg  daily. Magnesium is low, start magnesium oxide 400mg  daily   Zandra Abts MD

## 2016-07-12 NOTE — Telephone Encounter (Signed)
LM with wife for pt to call,sent new rx for lasix to [facility] drug

## 2016-07-12 NOTE — Telephone Encounter (Signed)
Pt informed to change lasix to 40 mg alternating with 20 mg daily and will begin magnesium 400 mg daily

## 2016-07-13 ENCOUNTER — Ambulatory Visit (INDEPENDENT_AMBULATORY_CARE_PROVIDER_SITE_OTHER): Payer: Medicare Other | Admitting: Internal Medicine

## 2016-07-13 ENCOUNTER — Encounter (INDEPENDENT_AMBULATORY_CARE_PROVIDER_SITE_OTHER): Payer: Self-pay | Admitting: Internal Medicine

## 2016-07-13 ENCOUNTER — Encounter (INDEPENDENT_AMBULATORY_CARE_PROVIDER_SITE_OTHER): Payer: Self-pay | Admitting: *Deleted

## 2016-07-13 VITALS — BP 90/60 | HR 76 | Temp 97.8°F | Ht 75.0 in | Wt 213.3 lb

## 2016-07-13 DIAGNOSIS — K7031 Alcoholic cirrhosis of liver with ascites: Secondary | ICD-10-CM

## 2016-07-13 LAB — COMPREHENSIVE METABOLIC PANEL WITH GFR
ALT: 20 U/L (ref 9–46)
AST: 53 U/L — ABNORMAL HIGH (ref 10–35)
Albumin: 2.5 g/dL — ABNORMAL LOW (ref 3.6–5.1)
Alkaline Phosphatase: 147 U/L — ABNORMAL HIGH (ref 40–115)
BUN: 10 mg/dL (ref 7–25)
CO2: 31 mmol/L (ref 20–31)
Calcium: 8.8 mg/dL (ref 8.6–10.3)
Chloride: 103 mmol/L (ref 98–110)
Creat: 1.19 mg/dL (ref 0.70–1.33)
Glucose, Bld: 110 mg/dL — ABNORMAL HIGH (ref 65–99)
Potassium: 4.7 mmol/L (ref 3.5–5.3)
Sodium: 140 mmol/L (ref 135–146)
Total Bilirubin: 2.8 mg/dL — ABNORMAL HIGH (ref 0.2–1.2)
Total Protein: 7.3 g/dL (ref 6.1–8.1)

## 2016-07-13 NOTE — Progress Notes (Signed)
Subjective:    Patient ID: [name], male    DOB: April 05, 1960, 56 y.o.   MRN: [id number]  HPI Here today for f/u. Hx of alcoholic cirrhosis. On 06/02/2016 underwent his 1st paracentesis with the removal of 4l fluid.  [facility] admitted to [facility] with AF with fast ventricular response. Also noted to have ascites and u;nderwent a paracentesis.  Weight 07/06/2016 220. Today his weight is 213.3. He tells me today he is doing good. His appetite is good.  He has less edema to his legs. He is not drinking etoh. Has not drank in over a year. He is having about 2 BMs a day.   03/25/2015 EGD/Colonoscopy:   Indications:  Patient is 56 year old Caucasian male with history of colonic adenomas and alcoholic cirrhosis who is undergoing surveillance colonoscopy in diagnostic EGD because of history of hematemesis. He has never been screened for esophageal or gastric varices.  Impression:  EGD findings: Single patch of salmon colored mucosa consistent with short segment Barrett's esophagus. Biopsies taken. No evidence of esophageal or gastric varices. Portal gastropathy. Erosive gastritis and duodenitis. Small prepyloric ulcer noted.  Colonoscopy findings; Examination performed to cecum. Small polyp ablated via cold biopsy from transverse colon. Left-sided diverticulosis. Internal and external hemorrhoids.  H. pylori is negative. Colonic polyp is tubular adenoma.  02/25/2015 US abdomen/elast:   IMPRESSION: 1. Cirrhosis. 2. Diffuse gallbladder wall thickening. This may be a nonspecific finding in the setting of cirrhosis with portal venous hypertension.  Median hepatic shear wave velocity is calculated at 4.19 m/sec.  Corresponding Metavir fibrosis score is F3 and F4.  Risk of fibrosis is high.  CBC    Component Value Date/Time   WBC 6.3 07/05/2016 1507   RBC 3.73 (L) 07/05/2016 1507   HGB 13.3 07/05/2016 1507   HCT 38.0 (L) 07/05/2016 1507   PLT 105 (L) 07/05/2016 1507   MCV 101.9  (H) 07/05/2016 1507   MCH 35.7 (H) 07/05/2016 1507   MCHC 35.0 07/05/2016 1507   RDW 14.6 07/05/2016 1507   LYMPHSABS 1.5 07/05/2016 1507   MONOABS 0.7 07/05/2016 1507   EOSABS 0.2 07/05/2016 1507   BASOSABS 0.1 07/05/2016 1507   Hepatic Function Latest Ref Rng & Units 07/05/2016 06/04/2016 06/02/2016  Total Protein 6.5 - 8.1 g/dL 6.9 6.0(L) 5.8(L)  Albumin 3.5 - 5.0 g/dL 2.3(L) 1.8(L) 1.9(L)  AST 15 - 41 U/L 46(H) 54(H) 57(H)  ALT 17 - 63 U/L '19 22 22  ' Alk Phosphatase 38 - 126 U/L 144(H) 151(H) 144(H)  Total Bilirubin 0.3 - 1.2 mg/dL 2.2(H) 2.2(H) 1.9(H)  Bilirubin, Direct <=0.2 mg/dL - - -      Review of Systems Past Medical History:  Diagnosis Date  . Anxiety   . Chronic back pain   . DDD (degenerative disc disease), lumbosacral   . Depression   . Elevated LFTs    secondary to ETOH  . History of alcohol abuse   . Hx of cardiac cath 1996  . Hyperlipidemia   . Hypertension   . Stroke [facility]) 2003    Past Surgical History:  Procedure Laterality Date  . CARDIAC CATHETERIZATION  1996  . COLONOSCOPY N/A 03/25/2015   Procedure: COLONOSCOPY;  Surgeon: Rogene Houston, MD;  Location: [facility];  Service: Endoscopy;  Laterality: N/A;  1130  . ESOPHAGOGASTRODUODENOSCOPY N/A 03/25/2015   Procedure: ESOPHAGOGASTRODUODENOSCOPY (EGD);  Surgeon: Rogene Houston, MD;  Location: [facility];  Service: Endoscopy;  Laterality: N/A;    Allergies  Allergen Reactions  .  Bee Venom Anaphylaxis and Nausea Only  . Bee Venom Anaphylaxis, Nausea And Vomiting and Swelling    Throat swelling    Current Outpatient Prescriptions on File Prior to Visit  Medication Sig Dispense Refill  . busPIRone (BUSPAR) 7.5 MG tablet Take 1 tablet (7.5 mg total) by mouth 2 (two) times daily. FOR ANXIETY 60 tablet 3  . carvedilol (COREG) 6.25 MG tablet Take 1 tablet (6.25 mg total) by mouth 2 (two) times daily. 180 tablet 3  . EPINEPHrine 0.3 mg/0.3 mL IJ SOAJ injection Inject 0.3 mLs (0.3 mg total) into the  muscle once. 1 Device 1  . FLUoxetine (PROZAC) 20 MG tablet Take 1 tablet (20 mg total) by mouth daily. 30 tablet 3  . furosemide (LASIX) 40 MG tablet Take 40 mg daily alternating with 20 mg daily 90 tablet 3  . lactulose (CHRONULAC) 10 GM/15ML solution TAKE 22.5ML BY MOUTH TWICE DAILY 473 mL 1  . lisinopril (PRINIVIL,ZESTRIL) 5 MG tablet Take 1 tablet (5 mg total) by mouth daily. 90 tablet 3  . omeprazole (PRILOSEC) 20 MG capsule Take 1 capsule (20 mg total) by mouth 2 (two) times daily. 60 capsule 3  . spironolactone (ALDACTONE) 25 MG tablet Take 1 tablet (25 mg total) by mouth daily. 30 tablet 3   No current facility-administered medications on file prior to visit.         Objective:   Physical Exam Blood pressure 90/60, pulse 76, temperature 97.8 F (36.6 C), height '6\' 3"'  (1.905 m), weight 213 lb 4.8 oz (96.8 kg). Alert and oriented. Skin warm and dry. Oral mucosa is moist.   . Sclera  Slightly icteric, conjunctivae is pink. Thyroid not enlarged. No cervical lymphadenopathy. Lungs clear. Heart regular rate irregular.  Abdomen is soft, distended. Bowel sounds are positive. No hepatomegaly. No abdominal masses felt. No tenderness. 1+ edema to lower extremities.           Assessment & Plan:  Alcoholic cirrhosis. CMET,  [place] RUQ, PT/INR, ammonia, AFP. CMET, US paracentesis.  OV 3 months.

## 2016-07-13 NOTE — Patient Instructions (Signed)
OV in 3 months. 

## 2016-07-14 ENCOUNTER — Ambulatory Visit (HOSPITAL_COMMUNITY)
Admission: RE | Admit: 2016-07-14 | Discharge: 2016-07-14 | Disposition: A | Discharge status: Home / Self Care | Payer: Medicare Other | Source: Ambulatory Visit | Attending: Internal Medicine | Admitting: Internal Medicine

## 2016-07-14 DIAGNOSIS — K7031 Alcoholic cirrhosis of liver with ascites: Secondary | ICD-10-CM

## 2016-07-14 DIAGNOSIS — L509 Urticaria, unspecified: Secondary | ICD-10-CM | POA: Insufficient documentation

## 2016-07-14 LAB — PROTIME-INR
INR: 1.2 — AB
PROTHROMBIN TIME: 12.6 s — AB (ref 9.0–11.5)

## 2016-07-14 LAB — GRAM STAIN

## 2016-07-14 LAB — BODY FLUID CELL COUNT WITH DIFFERENTIAL
Eos, Fluid: 1 %
LYMPHS FL: 65 %
Monocyte-Macrophage-Serous Fluid: 29 % — ABNORMAL LOW (ref 50–90)
NEUTROPHIL FLUID: 5 % (ref 0–25)
WBC FLUID: 331 uL (ref 0–1000)

## 2016-07-14 LAB — AMMONIA: Ammonia: 47 umol/L (ref <=47)

## 2016-07-14 LAB — AFP TUMOR MARKER: AFP TUMOR MARKER: 4.3 ng/mL (ref <6.1)

## 2016-07-14 MED ORDER — ALBUMIN HUMAN 25 % IV SOLN
INTRAVENOUS | Status: AC
  Filled 2016-07-14: qty 200

## 2016-07-14 MED ORDER — METHYLPREDNISOLONE SODIUM SUCC 125 MG IJ SOLR
125.0000 mg | Freq: Once | INTRAMUSCULAR | Status: AC
  Administered 2016-07-14: 125 mg via INTRAVENOUS
  Filled 2016-07-14 (×2): qty 2

## 2016-07-14 MED ORDER — DIPHENHYDRAMINE HCL 50 MG/ML IJ SOLN
INTRAMUSCULAR | Status: AC
  Administered 2016-07-14: 50 mg via INTRAVENOUS
  Filled 2016-07-14: qty 1

## 2016-07-14 MED ORDER — ALBUMIN HUMAN 25 % IV SOLN
50.0000 g | Freq: Once | INTRAVENOUS | Status: AC
  Administered 2016-07-14: 50 g via INTRAVENOUS

## 2016-07-14 MED ORDER — DIPHENHYDRAMINE HCL 50 MG/ML IJ SOLN
50.0000 mg | Freq: Once | INTRAMUSCULAR | Status: AC
  Administered 2016-07-14: 50 mg via INTRAVENOUS

## 2016-07-14 NOTE — Progress Notes (Signed)
Pt states itching has subsided and welts have disappeared upon inspection. No signs of distress. Pt ok to discharge.

## 2016-07-14 NOTE — Procedures (Signed)
PreOperative Dx: Alcoholic cirrhosis, ascites Postoperative Dx: Alcoholic cirrhosis, ascites Procedure:   US guided paracentesis Radiologist:  Thornton Papas Anesthesia:  10 ml of1% lidocaine Specimen:  4.3 L of yellow ascitic fluid EBL:   < 1 ml Complications: Hives following IV albumin; treated unsuccessfully with 50mg  Benadryl IV and successfully with 125mg  Solu-Medrol IV

## 2016-07-14 NOTE — Progress Notes (Signed)
Paracentesis complete 4.3L yellow colored ascites removed. Pt states that itching has decreased. Will continue to monitor.

## 2016-07-14 NOTE — Progress Notes (Addendum)
Pt began c/o itching on posterior after starting Albumin. Albumin stopped and verbal order obtained from Dr. Thornton Papas for benadryl 50mg  IV. Pt given Benadryl and monitored during paracentesis. No complaints of SOB or visual distress. Pt does have welts over his back and observed by MD as well.

## 2016-07-15 LAB — PATHOLOGIST SMEAR REVIEW

## 2016-07-18 ENCOUNTER — Emergency Department (HOSPITAL_COMMUNITY): Payer: Medicare Other

## 2016-07-18 ENCOUNTER — Encounter (HOSPITAL_COMMUNITY): Payer: Self-pay | Admitting: *Deleted

## 2016-07-18 ENCOUNTER — Inpatient Hospital Stay (HOSPITAL_COMMUNITY)
Admission: EM | Admit: 2016-07-18 | Discharge: 2016-07-20 | DRG: 291 | Disposition: A | Discharge status: Home / Self Care | Payer: Medicare Other | Attending: Internal Medicine | Admitting: Internal Medicine

## 2016-07-18 ENCOUNTER — Observation Stay (HOSPITAL_COMMUNITY): Payer: Medicare Other

## 2016-07-18 DIAGNOSIS — Z87891 Personal history of nicotine dependence: Secondary | ICD-10-CM

## 2016-07-18 DIAGNOSIS — R079 Chest pain, unspecified: Secondary | ICD-10-CM | POA: Diagnosis not present

## 2016-07-18 DIAGNOSIS — I482 Chronic atrial fibrillation: Secondary | ICD-10-CM | POA: Diagnosis not present

## 2016-07-18 DIAGNOSIS — I10 Essential (primary) hypertension: Secondary | ICD-10-CM | POA: Diagnosis not present

## 2016-07-18 DIAGNOSIS — I110 Hypertensive heart disease with heart failure: Secondary | ICD-10-CM | POA: Diagnosis not present

## 2016-07-18 DIAGNOSIS — K7460 Unspecified cirrhosis of liver: Secondary | ICD-10-CM | POA: Diagnosis present

## 2016-07-18 DIAGNOSIS — R0602 Shortness of breath: Secondary | ICD-10-CM

## 2016-07-18 DIAGNOSIS — I5043 Acute on chronic combined systolic (congestive) and diastolic (congestive) heart failure: Secondary | ICD-10-CM | POA: Diagnosis not present

## 2016-07-18 DIAGNOSIS — Z8249 Family history of ischemic heart disease and other diseases of the circulatory system: Secondary | ICD-10-CM

## 2016-07-18 DIAGNOSIS — J9601 Acute respiratory failure with hypoxia: Secondary | ICD-10-CM

## 2016-07-18 DIAGNOSIS — R188 Other ascites: Secondary | ICD-10-CM | POA: Diagnosis present

## 2016-07-18 DIAGNOSIS — Z888 Allergy status to other drugs, medicaments and biological substances status: Secondary | ICD-10-CM

## 2016-07-18 DIAGNOSIS — Z91030 Bee allergy status: Secondary | ICD-10-CM

## 2016-07-18 DIAGNOSIS — Z9889 Other specified postprocedural states: Secondary | ICD-10-CM

## 2016-07-18 DIAGNOSIS — J90 Pleural effusion, not elsewhere classified: Secondary | ICD-10-CM | POA: Diagnosis not present

## 2016-07-18 DIAGNOSIS — G8929 Other chronic pain: Secondary | ICD-10-CM | POA: Diagnosis present

## 2016-07-18 DIAGNOSIS — K7031 Alcoholic cirrhosis of liver with ascites: Secondary | ICD-10-CM | POA: Diagnosis present

## 2016-07-18 DIAGNOSIS — M5137 Other intervertebral disc degeneration, lumbosacral region: Secondary | ICD-10-CM | POA: Diagnosis present

## 2016-07-18 DIAGNOSIS — E785 Hyperlipidemia, unspecified: Secondary | ICD-10-CM | POA: Diagnosis present

## 2016-07-18 DIAGNOSIS — Z79899 Other long term (current) drug therapy: Secondary | ICD-10-CM

## 2016-07-18 DIAGNOSIS — I4891 Unspecified atrial fibrillation: Secondary | ICD-10-CM | POA: Diagnosis present

## 2016-07-18 DIAGNOSIS — Z8673 Personal history of transient ischemic attack (TIA), and cerebral infarction without residual deficits: Secondary | ICD-10-CM

## 2016-07-18 LAB — BODY FLUID CELL COUNT WITH DIFFERENTIAL
Lymphs, Fluid: 50 %
MONOCYTE-MACROPHAGE-SEROUS FLUID: 45 % — AB (ref 50–90)
NEUTROPHIL FLUID: 5 % (ref 0–25)
WBC FLUID: 774 uL (ref 0–1000)

## 2016-07-18 LAB — CBC
HEMATOCRIT: 39.2 % (ref 39.0–52.0)
Hemoglobin: 13.9 g/dL (ref 13.0–17.0)
MCH: 35.6 pg — ABNORMAL HIGH (ref 26.0–34.0)
MCHC: 35.5 g/dL (ref 30.0–36.0)
MCV: 100.5 fL — AB (ref 78.0–100.0)
Platelets: 116 10*3/uL — ABNORMAL LOW (ref 150–400)
RBC: 3.9 MIL/uL — AB (ref 4.22–5.81)
RDW: 13.6 % (ref 11.5–15.5)
WBC: 7.2 10*3/uL (ref 4.0–10.5)

## 2016-07-18 LAB — BASIC METABOLIC PANEL
Anion gap: 7 (ref 5–15)
BUN: 13 mg/dL (ref 6–20)
CHLORIDE: 100 mmol/L — AB (ref 101–111)
CO2: 28 mmol/L (ref 22–32)
Calcium: 8.4 mg/dL — ABNORMAL LOW (ref 8.9–10.3)
Creatinine, Ser: 0.79 mg/dL (ref 0.61–1.24)
GFR calc non Af Amer: >60 mL/min (ref >60)
Glucose, Bld: 124 mg/dL — ABNORMAL HIGH (ref 65–99)
POTASSIUM: 3.7 mmol/L (ref 3.5–5.1)
SODIUM: 135 mmol/L (ref 135–145)

## 2016-07-18 LAB — HEPATIC FUNCTION PANEL
ALK PHOS: 134 U/L — AB (ref 38–126)
ALT: 25 U/L (ref 17–63)
AST: 56 U/L — ABNORMAL HIGH (ref 15–41)
Albumin: 2.5 g/dL — ABNORMAL LOW (ref 3.5–5.0)
Bilirubin, Direct: 0.7 mg/dL — ABNORMAL HIGH (ref 0.1–0.5)
Indirect Bilirubin: 2.2 mg/dL — ABNORMAL HIGH (ref 0.3–0.9)
TOTAL PROTEIN: 7.1 g/dL (ref 6.5–8.1)
Total Bilirubin: 2.9 mg/dL — ABNORMAL HIGH (ref 0.3–1.2)

## 2016-07-18 LAB — PROTEIN, PLEURAL OR PERITONEAL FLUID: Total protein, fluid: <3 g/dL

## 2016-07-18 LAB — TROPONIN I
Troponin I: <0.03 ng/mL (ref <0.03)
Troponin I: <0.03 ng/mL (ref <0.03)

## 2016-07-18 LAB — GRAM STAIN

## 2016-07-18 LAB — BRAIN NATRIURETIC PEPTIDE: B Natriuretic Peptide: 236 pg/mL — ABNORMAL HIGH (ref 0.0–100.0)

## 2016-07-18 LAB — I-STAT TROPONIN, ED: Troponin i, poc: 0 ng/mL (ref 0.00–0.08)

## 2016-07-18 LAB — MRSA PCR SCREENING: MRSA BY PCR: NEGATIVE

## 2016-07-18 MED ORDER — PANTOPRAZOLE SODIUM 40 MG PO TBEC
40.0000 mg | DELAYED_RELEASE_TABLET | Freq: Two times a day (BID) | ORAL | Status: DC
  Administered 2016-07-18 – 2016-07-20 (×4): 40 mg via ORAL
  Filled 2016-07-18 (×4): qty 1

## 2016-07-18 MED ORDER — SODIUM CHLORIDE 0.9% FLUSH: 3.0000 mL | INTRAVENOUS | Status: DC | PRN

## 2016-07-18 MED ORDER — FUROSEMIDE 10 MG/ML IJ SOLN
40.0000 mg | Freq: Two times a day (BID) | INTRAMUSCULAR | Status: DC
  Administered 2016-07-18 – 2016-07-19 (×2): 40 mg via INTRAVENOUS
  Filled 2016-07-18 (×2): qty 4

## 2016-07-18 MED ORDER — ONDANSETRON HCL 4 MG/2ML IJ SOLN
4.0000 mg | Freq: Four times a day (QID) | INTRAMUSCULAR | Status: DC | PRN
  Administered 2016-07-19: 4 mg via INTRAVENOUS
  Filled 2016-07-18: qty 2

## 2016-07-18 MED ORDER — SODIUM CHLORIDE 0.9 % IV SOLN: 250.0000 mL | INTRAVENOUS | Status: DC | PRN

## 2016-07-18 MED ORDER — SODIUM CHLORIDE 0.9% FLUSH
3.0000 mL | Freq: Two times a day (BID) | INTRAVENOUS | Status: DC
  Administered 2016-07-18 – 2016-07-20 (×4): 3 mL via INTRAVENOUS

## 2016-07-18 MED ORDER — ONDANSETRON HCL 4 MG/2ML IJ SOLN
INTRAMUSCULAR | Status: AC
  Filled 2016-07-18: qty 2

## 2016-07-18 MED ORDER — HEPARIN SODIUM (PORCINE) 5000 UNIT/ML IJ SOLN
5000.0000 [IU] | Freq: Three times a day (TID) | INTRAMUSCULAR | Status: DC
  Administered 2016-07-18 – 2016-07-20 (×5): 5000 [IU] via SUBCUTANEOUS
  Filled 2016-07-18 (×6): qty 1

## 2016-07-18 MED ORDER — ACETAMINOPHEN 325 MG PO TABS: 650.0000 mg | ORAL_TABLET | ORAL | Status: DC | PRN

## 2016-07-18 MED ORDER — LISINOPRIL 5 MG PO TABS
5.0000 mg | ORAL_TABLET | Freq: Every day | ORAL | Status: DC
  Administered 2016-07-18: 5 mg via ORAL
  Filled 2016-07-18 (×2): qty 1

## 2016-07-18 MED ORDER — FLUOXETINE HCL 20 MG PO TABS
20.0000 mg | ORAL_TABLET | Freq: Every day | ORAL | Status: DC
  Filled 2016-07-18 (×3): qty 1

## 2016-07-18 MED ORDER — ONDANSETRON HCL 4 MG/2ML IJ SOLN
4.0000 mg | Freq: Once | INTRAMUSCULAR | Status: AC
  Administered 2016-07-18: 4 mg via INTRAVENOUS

## 2016-07-18 MED ORDER — SUCRALFATE 1 GM/10ML PO SUSP
1.0000 g | Freq: Four times a day (QID) | ORAL | Status: DC
  Administered 2016-07-18 – 2016-07-20 (×8): 1 g via ORAL
  Filled 2016-07-18 (×8): qty 10

## 2016-07-18 MED ORDER — BUSPIRONE HCL 5 MG PO TABS
7.5000 mg | ORAL_TABLET | Freq: Two times a day (BID) | ORAL | Status: DC
  Administered 2016-07-18 – 2016-07-20 (×4): 7.5 mg via ORAL
  Filled 2016-07-18 (×4): qty 2

## 2016-07-18 MED ORDER — LACTULOSE 10 GM/15ML PO SOLN
20.0000 g | Freq: Two times a day (BID) | ORAL | Status: DC
  Administered 2016-07-18 – 2016-07-20 (×4): 20 g via ORAL
  Filled 2016-07-18 (×4): qty 30

## 2016-07-18 MED ORDER — CARVEDILOL 3.125 MG PO TABS
6.2500 mg | ORAL_TABLET | Freq: Two times a day (BID) | ORAL | Status: DC
  Administered 2016-07-18 – 2016-07-20 (×4): 6.25 mg via ORAL
  Filled 2016-07-18 (×4): qty 2

## 2016-07-18 MED ORDER — SPIRONOLACTONE 25 MG PO TABS
25.0000 mg | ORAL_TABLET | Freq: Every day | ORAL | Status: DC
  Administered 2016-07-18 – 2016-07-20 (×3): 25 mg via ORAL
  Filled 2016-07-18 (×3): qty 1

## 2016-07-18 MED ORDER — IOPAMIDOL (ISOVUE-300) INJECTION 61%
75.0000 mL | Freq: Once | INTRAVENOUS | Status: AC | PRN
  Administered 2016-07-18: 75 mL via INTRAVENOUS

## 2016-07-18 MED ORDER — FLUOXETINE HCL 20 MG PO CAPS
20.0000 mg | ORAL_CAPSULE | Freq: Every day | ORAL | Status: DC
  Administered 2016-07-18 – 2016-07-20 (×3): 20 mg via ORAL
  Filled 2016-07-18 (×3): qty 1

## 2016-07-18 NOTE — H&P (Signed)
History and Physical    [name] QQP:[id number] DOB: 1960-09-04 DOA: 07/18/2016  PCP: [name], MD  Patient coming from: home  I have personally briefly reviewed patient's old medical records in [place]  Chief Complaint: shortness of breath  HPI: [name] is a 56 y.o. male with medical history significant of cirrhosis, chronic combined CHF, presents to hospital with complaints of chest pain and shortness of breath. He reports that for the past week he's had progressive worsening shortness of breath, dyspnea on exertion and orthopnea. Also noted abdominal distention. Patient reports compliance with his diuretics. He says he is compliant with salt intake, but is clearly taking in too much fluid. He recently had paracentesis done on 5/31 with removal of 4.3 L of fluid. He's also had chest heaviness for the past week which is worse on laying down. He's had cough productive of clear colored sputum for the past week and occasional posttussive vomiting. No diarrhea, no dysuria. No fever.  ED Course: Vitals were noted to be stable in the emergency room. EKG showed atrial fibrillation. Chest x-ray showed large right-sided pleural effusion. Other labs were otherwise unremarkable. He's been referred for admission.  Review of Systems: As per HPI otherwise 10 point review of systems negative.    Past Medical History:  Diagnosis Date  . Anxiety   . Chronic back pain   . DDD (degenerative disc disease), lumbosacral   . Depression   . Elevated LFTs    secondary to ETOH  . History of alcohol abuse   . Hx of cardiac cath 1996  . Hyperlipidemia   . Hypertension   . Stroke [facility]) 2003    Past Surgical History:  Procedure Laterality Date  . CARDIAC CATHETERIZATION  1996  . COLONOSCOPY N/A 03/25/2015   Procedure: COLONOSCOPY;  Surgeon: Rogene Houston, MD;  Location[facility];  Service: Endoscopy;  Laterality: N/A;  1130  . ESOPHAGOGASTRODUODENOSCOPY N/A 03/25/2015   Procedure: ESOPHAGOGASTRODUODENOSCOPY (EGD);  Surgeon: Rogene Houston, MD;  Location: [facility];  Service: Endoscopy;  Laterality: N/A;     reports that he has never smoked. He quit smokeless tobacco use about 2 years ago. His smokeless tobacco use included Chew. He reports that he does not drink alcohol or use drugs.  Allergies  Allergen Reactions  . Albumin (Human) Hives and Itching  . Bee Venom Anaphylaxis and Nausea Only  . Bee Venom Anaphylaxis, Nausea And Vomiting and Swelling    Throat swelling  . Other Hives    Pt states there was a fluid through IV he was getting while getting fluid taken off his stomach. It gave him hives    Family History  Problem Relation Age of Onset  . Heart disease Mother   . Cancer Father        throat cancer  . Cancer Brother        melanoma  . Thyroid disease Sister     Prior to Admission medications   Medication Sig Start Date End Date Taking? Authorizing Provider  busPIRone (BUSPAR) 7.5 MG tablet Take 1 tablet (7.5 mg total) by mouth 2 (two) times daily. FOR ANXIETY 07/06/16  Yes [place], Modena Nunnery, MD  CARAFATE 1 GM/10ML suspension Take 10 mLs by mouth 4 (four) times daily. 06/29/16  Yes [provider]  carvedilol (COREG) 6.25 MG tablet Take 1 tablet (6.25 mg total) by mouth 2 (two) times daily. 07/06/16  Yes [place], Modena Nunnery, MD  FLUoxetine [facility])  20 MG tablet Take 1 tablet (20 mg total) by mouth daily. 07/06/16  Yes [place], Modena Nunnery, MD  furosemide (LASIX) 40 MG tablet Take 40 mg daily alternating with 20 mg daily 07/12/16  Yes [place], Alphonse Guild, MD  lactulose [facility]) 10 GM/15ML solution TAKE 22.5ML BY MOUTH TWICE DAILY 06/29/16  Yes [place], Modena Nunnery, MD  lisinopril (PRINIVIL,ZESTRIL) 5 MG tablet Take 1 tablet (5 mg total) by mouth daily. 07/06/16 10/04/16 Yes [place], Modena Nunnery, MD  omeprazole (PRILOSEC) 20 MG capsule Take 1 capsule (20 mg total) by mouth 2 (two) times daily. 07/06/16  Yes [place], Modena Nunnery, MD  spironolactone  (ALDACTONE) 25 MG tablet Take 1 tablet (25 mg total) by mouth daily. 07/06/16  Yes [place], Modena Nunnery, MD  thiamine (VITAMIN B-1) 50 MG tablet Take by mouth daily.   Yes [provider]  EPINEPHrine 0.3 mg/0.3 mL IJ SOAJ injection Inject 0.3 mLs (0.3 mg total) into the muscle once. 06/11/15   [name], MD    Physical Exam: Vitals:   07/18/16 1030 07/18/16 1047 07/18/16 1106 07/18/16 1148  BP: 118/83 133/75 123/65 (!) 153/88  Pulse: 96 (!) 102 (!) 102 61  Resp: (!) 26 (!) 22 (!) 22 16  Temp:    98 F (36.7 C)  TempSrc:    Oral  SpO2: 98% 98% 98% 95%  Weight:    93.1 kg (205 lb 4.8 oz)  Height:    6\' 3"  (1.905 m)    Constitutional: NAD, calm, comfortable Vitals:   07/18/16 1030 07/18/16 1047 07/18/16 1106 07/18/16 1148  BP: 118/83 133/75 123/65 (!) 153/88  Pulse: 96 (!) 102 (!) 102 61  Resp: (!) 26 (!) 22 (!) 22 16  Temp:    98 F (36.7 C)  TempSrc:    Oral  SpO2: 98% 98% 98% 95%  Weight:    93.1 kg (205 lb 4.8 oz)  Height:    6\' 3"  (1.905 m)   Eyes: PERRL, lids and conjunctivae normal ENMT: Mucous membranes are moist. Posterior pharynx clear of any exudate or lesions.Normal dentition.  Neck: normal, supple, no masses, no thyromegaly Respiratory:diminished breath sounds at right base. Normal respiratory effort. No accessory muscle use.  Cardiovascular: irregular rate, no murmurs / rubs / gallops. 1-2+ extremity edema. 2+ pedal pulses. No carotid bruits.  Abdomen: distended, ventral hernia present which is nontender, no tenderness, no masses palpated. No hepatosplenomegaly. Bowel sounds positive.  Musculoskeletal: no clubbing / cyanosis. No joint deformity upper and lower extremities. Good ROM, no contractures. Normal muscle tone.  Skin: no rashes, lesions, ulcers. No induration Neurologic: CN 2-12 grossly intact. Sensation intact, DTR normal. Strength 5/5 in all 4.  Psychiatric: Normal judgment and insight. Alert and oriented x 3. Normal mood.   Labs on  Admission: I have personally reviewed following labs and imaging studies  CBC:  Recent Labs Lab 07/18/16 0652  WBC 7.2  HGB 13.9  HCT 39.2  MCV 100.5*  PLT 130*   Basic Metabolic Panel:  Recent Labs Lab 07/13/16 1009 07/18/16 0652  NA 140 135  K 4.7 3.7  CL 103 100*  CO2 31 28  GLUCOSE 110* 124*  BUN 10 13  CREATININE 1.19 0.79  CALCIUM 8.8 8.4*   GFR: Estimated Creatinine Clearance: 121.8 mL/min (by C-G formula based on SCr of 0.79 mg/dL). Liver Function Tests:  Recent Labs Lab 07/13/16 1009 07/18/16 0650  AST 53* 56*  ALT 20 25  ALKPHOS 147* 134*  BILITOT 2.8* 2.9*  PROT 7.3 7.1  ALBUMIN 2.5* 2.5*   No results for input(s): LIPASE, AMYLASE in the last 168 hours. No results for input(s): AMMONIA in the last 168 hours. Coagulation Profile:  Recent Labs Lab 07/13/16 1009  INR 1.2*   Cardiac Enzymes: No results for input(s): CKTOTAL, CKMB, CKMBINDEX, TROPONINI in the last 168 hours. BNP (last 3 results) No results for input(s): PROBNP in the last 8760 hours. HbA1C: No results for input(s): HGBA1C in the last 72 hours. CBG: No results for input(s): GLUCAP in the last 168 hours. Lipid Profile: No results for input(s): CHOL, HDL, LDLCALC, TRIG, CHOLHDL, LDLDIRECT in the last 72 hours. Thyroid Function Tests: No results for input(s): TSH, T4TOTAL, FREET4, T3FREE, THYROIDAB in the last 72 hours. Anemia Panel: No results for input(s): VITAMINB12, FOLATE, FERRITIN, TIBC, IRON, RETICCTPCT in the last 72 hours. Urine analysis:    Component Value Date/Time   COLORURINE AMBER (A) 12/14/2015 1439   APPEARANCEUR CLEAR 12/14/2015 1439   LABSPEC 1.015 12/14/2015 1439   PHURINE 6.0 12/14/2015 1439   GLUCOSEU TRACE (A) 12/14/2015 1439   HGBUR 3+ (A) 12/14/2015 1439   BILIRUBINUR NEGATIVE 12/14/2015 1439   KETONESUR TRACE (A) 12/14/2015 1439   PROTEINUR 1+ (A) 12/14/2015 1439   UROBILINOGEN 1.0 07/26/2014 2231   NITRITE NEGATIVE 12/14/2015 1439    LEUKOCYTESUR NEGATIVE 12/14/2015 1439    Radiological Exams on Admission: Dg Chest 1 View  Result Date: 07/18/2016 CLINICAL DATA:  RIGHT pleural effusion post thoracentesis EXAM: CHEST 1 VIEW COMPARISON:  CT chest 07/18/2016 FINDINGS: Upper normal size of cardiac silhouette. Mediastinal contours and pulmonary vascularity normal. Moderate to large RIGHT pleural effusion remains despite removal of 2.1 L of fluid from the RIGHT hemithorax. Associated RIGHT basilar atelectasis. No pneumothorax. LEFT lung clear. IMPRESSION: Persistent moderate to large RIGHT pleural is fusion and basilar atelectasis despite pre seeding thoracentesis removing 2.1 L of fluid. No pneumothorax. Patient currently asymptomatic. Electronically Signed   By: Lavonia Dana M.D.   On: 07/18/2016 11:32   Ct Chest W Contrast  Result Date: 07/18/2016 CLINICAL DATA:  56 year old hypertensive alcoholic male with shortness of breath. Subsequent encounter. EXAM: CT CHEST WITH CONTRAST TECHNIQUE: Multidetector CT imaging of the chest was performed during intravenous contrast administration. CONTRAST:  51mL ISOVUE-300 IOPAMIDOL (ISOVUE-300) INJECTION 61% COMPARISON:  07/18/2016 and 04/01/2016 chest x-ray. 07/14/2016 ultrasound. FINDINGS: Cardiovascular: Mild motion degradation. No central pulmonary embolus or aortic dissection. Coronary artery calcifications.  Trace pericardial fluid. Ascending thoracic aorta measures up to 3.5 cm. Trace calcification right subclavian artery. Trace calcification descending thoracic aorta. Mediastinum/Nodes: Tiny mediastinal/hilar lymph nodes without adenopathy. Prominent varices surround the lower esophagus. Lungs/Pleura: Large right-sided pleural effusion with atelectasis of right lung without central obstructing lesion. 3.4 mm nodule right upper lobe adjacent to small sensory fissure (series 6, image 149). Minimal basilar atelectatic changes. Upper Abdomen: Cirrhotic liver. Prominent varices upper abdomen splenic/  esophageal and upper gastric region. Not able to confirm portal vein is patent and there are prominent vessels in this region which may represent cavernous transformation. Musculoskeletal: Degenerative changes cervical spine and thoracic spine. T12 remote anterior wedge compression deformities superior endplate Schmorl's node deformity. Mild gynecomastia. IMPRESSION: Large right-sided pleural effusion with atelectasis of right lung without central obstructing lesion. Cirrhotic liver with prominent varices surrounding the lower esophagus, gastroesophageal junction, upper stomach and in the region of the splenic hilum. 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 Coronary artery calcifications. Scattered  trace aortic/great vessel calcified plaque. 3.4 mm nodule left upper lobe (series 6, image 149). No follow-up needed if patient is low-risk. Non-contrast chest CT can be considered in 12 months if patient is high-risk. This recommendation follows the consensus statement: Guidelines for Management of Incidental Pulmonary Nodules Detected on CT Images: From the Fleischner Society 2017; Radiology 2017; 284:228-243. Electronically Signed   By: Genia Del M.D.   On: 07/18/2016 09:34   Dg Chest Portable 1 View  Result Date: 07/18/2016 CLINICAL DATA:  Mid chest pain and shortness of breath since yesterday. Paracentesis last week. Known right pleural effusion. EXAM: PORTABLE CHEST 1 VIEW COMPARISON:  Portable chest x-ray of June 01, 2016 FINDINGS: There is a large right pleural effusion occupying 2/3 of the pleural space volume. The left lung is well-expanded and clear without evidence of a pleural effusion. The left heart border appears normal. The pulmonary vascularity is not clearly engorged. IMPRESSION: Large right pleural effusion.  No definite abnormality on the left. Electronically Signed   By: David   [place] M.D.   On: 07/18/2016 07:19   US Thoracentesis Asp Pleural Space W/img Guide  Result Date: 07/18/2016 INDICATION: RIGHT pleural effusion, shortness of breath EXAM: ULTRASOUND GUIDED DIAGNOSTIC AND THERAPEUTIC RIGHT THORACENTESIS MEDICATIONS: None. COMPLICATIONS: None immediate. PROCEDURE: Procedure, benefits, and risks of procedure were discussed with patient. Written informed consent for procedure was obtained. Time out protocol followed. Pleural effusion localized by ultrasound at the posterior RIGHT hemithorax. Skin prepped and draped in usual sterile fashion. Skin and soft tissues anesthetized with 10 mL of 1% lidocaine. 8 French thoracentesis catheter placed into the RIGHT pleural space. 2.1 L of yellow to amber colored ascitic fluid aspirated by syringe pump. Procedure tolerated well by patient without immediate complication. Patient developed no rash or hives from the procedure; the hives experienced during the previous paracentesis are therefore related to the IV albumin administration as suspected and not related to the lidocaine anesthesia or the chlorhexidine prep. FINDINGS: A total of approximately 2.1 L of RIGHT pleural fluid was removed. Samples were sent to the laboratory as requested by the clinical team. IMPRESSION: Successful ultrasound guided RIGHT thoracentesis yielding 2.1 L of pleural fluid. Electronically Signed   By: Lavonia Dana M.D.   On: 07/18/2016 11:26    EKG: Independently reviewed. A fib  Assessment/Plan Active Problems:   Essential hypertension   Hyperlipidemia   Cirrhosis of liver with ascites (HCC)   A-fib (HCC)   Chest pain   Pleural effusion, right   Acute on chronic combined systolic and diastolic CHF (congestive heart failure) (HCC)  1. Large right pleural effusion. Suspect this may be related to CHF versus hepatic hydrothorax. Patient has undergone thoracentesis with removal of 2.1L of fluid which appears to be transudative. He still has a moderate to  large right-sided effusion. This may need a repeat thoracentesis prior to discharge. We'll continue to monitor. Overall he feels that his breathing is improving. 2. Ascites related to cirrhosis. Will request paracentesis to be performed in a.m . continue on diuretics. 3. Acute on chronic combined CHF. Ejection fraction of 40-45%. He is already on beta blockers and ace inhibitors. Continue on spironolactone and start IV Lasix. 4. Chest pain. Suspect this is related to large pleural effusion. Cycle cardiac markers. 5. Atrial fibrillation. A candidate for anticoagulation due to liver disease and prior history of GI bleeding. Continue on beta blockers 6. Hyperlipidemia. Continue statin 7. Hypertension. Continue beta blockers and ace inhibitors.  DVT prophylaxis: heparin Code  Status: full code Family Communication: no family present Disposition Plan: discharge home once improved Consults called:  Admission status: observation, tele   Jiana Lemaire MD [facility] Pager [phone]  If 7PM-7AM, please contact night-coverage [email or web address] Password [email or web address]  07/18/2016, 5:17 PM

## 2016-07-18 NOTE — ED Triage Notes (Signed)
Pt c/o SOB with chest pain that comes and goes, "shooting x 1day.

## 2016-07-18 NOTE — Procedures (Signed)
PreOperative Dx: RIGHT pleural effusion Postoperative Dx: RIGHT pleural effusion Procedure:   US guided RIGHT thoracentesis Radiologist:  Thornton Papas Anesthesia:  10 ml of 1% lidocaine Specimen:  2.1 L of yellow to amber colored colored fluid EBL:   < 1 ml Complications: None

## 2016-07-18 NOTE — ED Notes (Signed)
Pt to room from x-ray /  [place]
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
## 2016-07-18 NOTE — Progress Notes (Signed)
Thoracentesis complete no signs of distress. 2.1 L yellow colored pleural fluid removed.

## 2016-07-19 ENCOUNTER — Observation Stay (HOSPITAL_COMMUNITY): Payer: Medicare Other

## 2016-07-19 ENCOUNTER — Inpatient Hospital Stay (HOSPITAL_COMMUNITY): Payer: Medicare Other

## 2016-07-19 DIAGNOSIS — Z8673 Personal history of transient ischemic attack (TIA), and cerebral infarction without residual deficits: Secondary | ICD-10-CM | POA: Diagnosis not present

## 2016-07-19 DIAGNOSIS — Z888 Allergy status to other drugs, medicaments and biological substances status: Secondary | ICD-10-CM | POA: Diagnosis not present

## 2016-07-19 DIAGNOSIS — R0602 Shortness of breath: Secondary | ICD-10-CM | POA: Diagnosis present

## 2016-07-19 DIAGNOSIS — Z8249 Family history of ischemic heart disease and other diseases of the circulatory system: Secondary | ICD-10-CM | POA: Diagnosis not present

## 2016-07-19 DIAGNOSIS — I482 Chronic atrial fibrillation: Secondary | ICD-10-CM | POA: Diagnosis not present

## 2016-07-19 DIAGNOSIS — M5137 Other intervertebral disc degeneration, lumbosacral region: Secondary | ICD-10-CM | POA: Diagnosis present

## 2016-07-19 DIAGNOSIS — G8929 Other chronic pain: Secondary | ICD-10-CM | POA: Diagnosis present

## 2016-07-19 DIAGNOSIS — I110 Hypertensive heart disease with heart failure: Secondary | ICD-10-CM | POA: Diagnosis present

## 2016-07-19 DIAGNOSIS — J90 Pleural effusion, not elsewhere classified: Secondary | ICD-10-CM | POA: Diagnosis not present

## 2016-07-19 DIAGNOSIS — Z87891 Personal history of nicotine dependence: Secondary | ICD-10-CM | POA: Diagnosis not present

## 2016-07-19 DIAGNOSIS — Z91030 Bee allergy status: Secondary | ICD-10-CM | POA: Diagnosis not present

## 2016-07-19 DIAGNOSIS — K7031 Alcoholic cirrhosis of liver with ascites: Secondary | ICD-10-CM | POA: Diagnosis present

## 2016-07-19 DIAGNOSIS — Z79899 Other long term (current) drug therapy: Secondary | ICD-10-CM | POA: Diagnosis not present

## 2016-07-19 DIAGNOSIS — E785 Hyperlipidemia, unspecified: Secondary | ICD-10-CM | POA: Diagnosis present

## 2016-07-19 DIAGNOSIS — J9601 Acute respiratory failure with hypoxia: Secondary | ICD-10-CM

## 2016-07-19 DIAGNOSIS — I4891 Unspecified atrial fibrillation: Secondary | ICD-10-CM | POA: Diagnosis present

## 2016-07-19 DIAGNOSIS — I5043 Acute on chronic combined systolic (congestive) and diastolic (congestive) heart failure: Secondary | ICD-10-CM | POA: Diagnosis present

## 2016-07-19 DIAGNOSIS — K7460 Unspecified cirrhosis of liver: Secondary | ICD-10-CM | POA: Diagnosis not present

## 2016-07-19 LAB — BASIC METABOLIC PANEL
Anion gap: 5 (ref 5–15)
BUN: 15 mg/dL (ref 6–20)
CO2: 28 mmol/L (ref 22–32)
Calcium: 7.6 mg/dL — ABNORMAL LOW (ref 8.9–10.3)
Chloride: 101 mmol/L (ref 101–111)
Creatinine, Ser: 1.11 mg/dL (ref 0.61–1.24)
GFR calc Af Amer: >60 mL/min (ref >60)
GFR calc non Af Amer: >60 mL/min (ref >60)
Glucose, Bld: 149 mg/dL — ABNORMAL HIGH (ref 65–99)
Potassium: 3.8 mmol/L (ref 3.5–5.1)
Sodium: 134 mmol/L — ABNORMAL LOW (ref 135–145)

## 2016-07-19 LAB — CULTURE, BODY FLUID-BOTTLE: CULTURE: NO GROWTH

## 2016-07-19 LAB — TROPONIN I: Troponin I: <0.03 ng/mL (ref <0.03)

## 2016-07-19 LAB — CULTURE, BODY FLUID W GRAM STAIN -BOTTLE

## 2016-07-19 MED ORDER — FUROSEMIDE 40 MG PO TABS
40.0000 mg | ORAL_TABLET | Freq: Every day | ORAL | Status: DC
  Administered 2016-07-20: 40 mg via ORAL
  Filled 2016-07-19: qty 1

## 2016-07-19 MED ORDER — LEVALBUTEROL HCL 0.63 MG/3ML IN NEBU
0.6300 mg | INHALATION_SOLUTION | Freq: Four times a day (QID) | RESPIRATORY_TRACT | Status: DC
  Administered 2016-07-19 – 2016-07-20 (×5): 0.63 mg via RESPIRATORY_TRACT
  Filled 2016-07-19 (×5): qty 3

## 2016-07-19 MED ORDER — GUAIFENESIN-DM 100-10 MG/5ML PO SYRP
5.0000 mL | ORAL_SOLUTION | ORAL | Status: DC | PRN
  Administered 2016-07-19 – 2016-07-20 (×4): 5 mL via ORAL
  Filled 2016-07-19 (×4): qty 5

## 2016-07-19 NOTE — Care Management Obs Status (Signed)
[facility] NOTIFICATION   Patient Details  Name: HUNG RHINESMITH MRN: [id number] Date of Birth: 1960-09-29   Medicare Observation Status Notification Given:  Yes    Mackayla Mullins, Chauncey Reading, RN 07/19/2016, 11:03 AM

## 2016-07-19 NOTE — Care Management Note (Signed)
Case Management Note  Patient Details  Name: [name] MRN: [id number] Date of Birth: June 03, 1960  Subjective/Objective:   Adm with CP/ascites/SOB. Thoracentesis yesterday. Paracentesis scheduled today. From home, ind PTA.             Action/Plan: Plans to return home at DC. No CM needs known.    Expected Discharge Date:      07/20/2016           Expected Discharge Plan:  Home/Self Care  In-House Referral:     Discharge planning Services  CM Consult  Post Acute Care Choice:    Choice offered to:     DME Arranged:    DME Agency:     HH Arranged:    HH Agency:     Status of Service:  In process, will continue to follow  If discussed at Long Length of Stay Meetings, dates discussed:    Additional Comments:  Acey [place], Chauncey Reading, RN 07/19/2016, 11:21 AM

## 2016-07-19 NOTE — Progress Notes (Signed)
PROGRESS NOTE    [name]  NOM:[id number] DOB: 18-Jan-1961 DOA: 07/18/2016 PCP: [name], MD    Brief Narrative:  56 y/o male with history of alcoholic cirrhosis, CHF, presented with shortness of breath and chest pain. Found to have a large right pleural effusion. He underwent thoracentesis on admission with removal of 2.1L of fluid. He still had moderate pleural effusion post thoracentesis. He will undergo repeat thoracentesis in AM. He has been on IV lasix for generalized edema, which has improved. If his breathing has improved after thoracentesis, he can possibly discharge home in AM   Assessment & Plan:   Active Problems:   Essential hypertension   Hyperlipidemia   Cirrhosis of liver with ascites (HCC)   A-fib (HCC)   Chest pain   Pleural effusion, right   Acute on chronic combined systolic and diastolic CHF (congestive heart failure) (HCC)   Acute respiratory failure with hypoxia (HCC)   1. Large right pleural effusion. Suspect this may be related to CHF versus hepatic hydrothorax. Patient has undergone thoracentesis with removal of 2.1L of fluid which appears to be transudative. He still has a moderate to large right-sided effusion and is still short of breath. He will undergo repeat thoracentesis in AM. We'll continue to monitor. Overall he feels that his breathing is improving. 2. Acute respiratory failure with hypoxia. Related to pleural effusion. Continue to wean off oxygen as tolerated. 3. Ascites related to cirrhosis. Ultrasound did not show enough fluid for paracentesis. Continue on diuretics. 4. Acute on chronic combined CHF. Ejection fraction of 40-45%. Overall peripheral edema is improving. Since blood pressures are running low, will change diuretics to po. He is already on beta blockers. will hold lisinopril for now. 5. Chest pain. Suspect this is related to large pleural effusion. He has ruled out for ACS with negative cardiac markers. 6. Atrial  fibrillation. Not a candidate for anticoagulation due to liver disease and prior history of GI bleeding. Continue on beta blockers 7. Hyperlipidemia. Continue statin 8. Hypertension. Continue beta blockers. Hold ace inhibitors due to low blood pressures.   DVT prophylaxis: heparin Code Status: full code Family Communication: no family present Disposition Plan: possible discharge home in AM after thoracentesis   Consultants:     Procedures:   6/4 Thoracentesis of right pleural effusion with removal of 2.1L  Antimicrobials:      Subjective: Still feels short of breath. Had shortness of breath and coughing overnight  Objective: Vitals:   07/18/16 2131 07/19/16 0500 07/19/16 0600 07/19/16 1300  BP: 135/75 (!) 80/48 94/62 99/74   Pulse: 92 88  84  Resp: 18 18  20   Temp: 98.2 F (36.8 C) 98.3 F (36.8 C)  98.6 F (37 C)  TempSrc: Oral Oral  Oral  SpO2: 96% 98%  98%  Weight:  93.3 kg (205 lb 11 oz)    Height:        Intake/Output Summary (Last 24 hours) at 07/19/16 1602 Last data filed at 07/19/16 1300  Gross per 24 hour  Intake              483 ml  Output             1300 ml  Net             -817 ml   Filed Weights   07/18/16 1148 07/18/16 1701 07/19/16 0500  Weight: 93.1 kg (205 lb 4.8 oz) 93.1 kg (205 lb 4.8 oz) 93.3 kg (205 lb 11 oz)  Examination:  General exam: Appears calm and comfortable  Respiratory system: diminished breath sounds on right base. Respiratory effort normal. Cardiovascular system: S1 & S2 heard, irregular. No JVD, murmurs, rubs, gallops or clicks. trace pedal edema. Gastrointestinal system: Abdomen is distended, soft and nontender. No organomegaly or masses felt. Normal bowel sounds heard. Ventral hernia is nontender Central nervous system: Alert and oriented. No focal neurological deficits. Extremities: Symmetric 5 x 5 power. Skin: No rashes, lesions or ulcers Psychiatry: Judgement and insight appear normal. Mood & affect appropriate.       Data Reviewed: I have personally reviewed following labs and imaging studies  CBC:  Recent Labs Lab 07/18/16 0652  WBC 7.2  HGB 13.9  HCT 39.2  MCV 100.5*  PLT 027*   Basic Metabolic Panel:  Recent Labs Lab 07/13/16 1009 07/18/16 0652 07/19/16 0445  NA 140 135 134*  K 4.7 3.7 3.8  CL 103 100* 101  CO2 31 28 28   GLUCOSE 110* 124* 149*  BUN 10 13 15   CREATININE 1.19 0.79 1.11  CALCIUM 8.8 8.4* 7.6*   GFR: Estimated Creatinine Clearance: 87.8 mL/min (by C-G formula based on SCr of 1.11 mg/dL). Liver Function Tests:  Recent Labs Lab 07/13/16 1009 07/18/16 0650  AST 53* 56*  ALT 20 25  ALKPHOS 147* 134*  BILITOT 2.8* 2.9*  PROT 7.3 7.1  ALBUMIN 2.5* 2.5*   No results for input(s): LIPASE, AMYLASE in the last 168 hours. No results for input(s): AMMONIA in the last 168 hours. Coagulation Profile:  Recent Labs Lab 07/13/16 1009  INR 1.2*   Cardiac Enzymes:  Recent Labs Lab 07/18/16 1845 07/18/16 2255 07/19/16 0445  TROPONINI <0.03 <0.03 <0.03   BNP (last 3 results) No results for input(s): PROBNP in the last 8760 hours. HbA1C: No results for input(s): HGBA1C in the last 72 hours. CBG: No results for input(s): GLUCAP in the last 168 hours. Lipid Profile: No results for input(s): CHOL, HDL, LDLCALC, TRIG, CHOLHDL, LDLDIRECT in the last 72 hours. Thyroid Function Tests: No results for input(s): TSH, T4TOTAL, FREET4, T3FREE, THYROIDAB in the last 72 hours. Anemia Panel: No results for input(s): VITAMINB12, FOLATE, FERRITIN, TIBC, IRON, RETICCTPCT in the last 72 hours. Sepsis Labs: No results for input(s): PROCALCITON, LATICACIDVEN in the last 168 hours.  Recent Results (from the past 240 hour(s))  Gram stain     Status: None   Collection Time: 07/14/16  9:07 AM  Result Value Ref Range Status   Specimen Description ASCITIC  Final   Special Requests NONE  Final   Gram Stain   Final    RARE WBC PRESENT, PREDOMINANTLY MONONUCLEAR NO  ORGANISMS SEEN Performed at [facility], [address], [place], [place] [address]    Report Status 07/14/2016 FINAL  Final  Culture, body fluid-bottle     Status: None   Collection Time: 07/14/16  9:07 AM  Result Value Ref Range Status   Specimen Description ASCITIC  Final   Special Requests BOTTLES DRAWN AEROBIC AND ANAEROBIC 6CC  Final   Culture NO GROWTH 5 DAYS  Final   Report Status 07/19/2016 FINAL  Final  Culture, body fluid-bottle     Status: None (Preliminary result)   Collection Time: 07/18/16 10:45 AM  Result Value Ref Range Status   Specimen Description PLEURAL RIGHT COLLECTED BY DOCTOR  Final   Special Requests BOTTLES DRAWN AEROBIC AND ANAEROBIC 10CC EACH  Final   Culture NO GROWTH < 24 HOURS  Final   Report  Status PENDING  Incomplete  Gram stain     Status: None   Collection Time: 07/18/16 10:45 AM  Result Value Ref Range Status   Specimen Description PLEURAL RIGHT  Final   Special Requests NONE  Final   Gram Stain   Final    WBC PRESENT,BOTH PMN AND MONONUCLEAR NO ORGANISMS SEEN CYTOSPIN SMEAR Performed at [facility], [address], [place], [place] [address]    Report Status 07/18/2016 FINAL  Final  MRSA PCR Screening     Status: None   Collection Time: 07/18/16  5:39 PM  Result Value Ref Range Status   MRSA by PCR NEGATIVE NEGATIVE Final    Comment:        The GeneXpert MRSA Assay (FDA approved for NASAL specimens only), is one component of a comprehensive MRSA colonization surveillance program. It is not intended to diagnose MRSA infection nor to guide or monitor treatment for MRSA infections.          Radiology Studies: Dg Chest 1 View  Result Date: 07/18/2016 CLINICAL DATA:  RIGHT pleural effusion post thoracentesis EXAM: CHEST 1 VIEW COMPARISON:  CT chest 07/18/2016 FINDINGS: Upper normal size of cardiac silhouette. Mediastinal contours and pulmonary vascularity normal. Moderate to large RIGHT pleural effusion remains despite  removal of 2.1 L of fluid from the RIGHT hemithorax. Associated RIGHT basilar atelectasis. No pneumothorax. LEFT lung clear. IMPRESSION: Persistent moderate to large RIGHT pleural is fusion and basilar atelectasis despite pre seeding thoracentesis removing 2.1 L of fluid. No pneumothorax. Patient currently asymptomatic. Electronically Signed   By: Lavonia Dana M.D.   On: 07/18/2016 11:32   Ct Chest W Contrast  Result Date: 07/18/2016 CLINICAL DATA:  56 year old hypertensive alcoholic male with shortness of breath. Subsequent encounter. EXAM: CT CHEST WITH CONTRAST TECHNIQUE: Multidetector CT imaging of the chest was performed during intravenous contrast administration. CONTRAST:  31mL ISOVUE-300 IOPAMIDOL (ISOVUE-300) INJECTION 61% COMPARISON:  07/18/2016 and 04/01/2016 chest x-ray. 07/14/2016 ultrasound. FINDINGS: Cardiovascular: Mild motion degradation. No central pulmonary embolus or aortic dissection. Coronary artery calcifications.  Trace pericardial fluid. Ascending thoracic aorta measures up to 3.5 cm. Trace calcification right subclavian artery. Trace calcification descending thoracic aorta. Mediastinum/Nodes: Tiny mediastinal/hilar lymph nodes without adenopathy. Prominent varices surround the lower esophagus. Lungs/Pleura: Large right-sided pleural effusion with atelectasis of right lung without central obstructing lesion. 3.4 mm nodule right upper lobe adjacent to small sensory fissure (series 6, image 149). Minimal basilar atelectatic changes. Upper Abdomen: Cirrhotic liver. Prominent varices upper abdomen splenic/ esophageal and upper gastric region. Not able to confirm portal vein is patent and there are prominent vessels in this region which may represent cavernous transformation. Musculoskeletal: Degenerative changes cervical spine and thoracic spine. T12 remote anterior wedge compression deformities superior endplate Schmorl's node deformity. Mild gynecomastia. IMPRESSION: Large right-sided  pleural effusion with atelectasis of right lung without central obstructing lesion. Cirrhotic liver with prominent varices surrounding the lower esophagus, gastroesophageal junction, upper stomach and in the region of the splenic hilum. 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 Coronary artery calcifications. Scattered trace aortic/great vessel calcified plaque. 3.4 mm nodule left upper lobe (series 6, image 149). No follow-up needed if patient is low-risk. Non-contrast chest CT can be considered in 12 months if patient is high-risk. This recommendation follows the consensus statement: Guidelines for Management of Incidental Pulmonary Nodules Detected on CT Images: From the Fleischner Society 2017; Radiology 2017; 284:228-243. Electronically Signed  By: Genia Del M.D.   On: 07/18/2016 09:34   US Abdomen Limited  Result Date: 07/19/2016 CLINICAL DATA:  Alcoholic cirrhosis, ascites, paracentesis yesterday. EXAM: LIMITED ABDOMEN ULTRASOUND FOR ASCITES TECHNIQUE: Limited ultrasound survey for ascites was performed in all four abdominal quadrants. COMPARISON:  Right upper quadrant ultrasound of Jul 14, 2016. FINDINGS: There is ascites in the right lower quadrant with a small volume in the left lower quadrant. The volume of ascites has decreased since the paracentesis yesterday. Liver: Liver appears shrunken and its surface contour irregular where visualized. IMPRESSION: Cirrhotic changes within the liver. Small amount of ascites in the lower quadrants bilaterally. Electronically Signed   By: David  Martinique M.D.   On: 07/19/2016 10:18   Dg Chest Portable 1 View  Result Date: 07/18/2016 CLINICAL DATA:  Mid chest pain and shortness of breath since yesterday. Paracentesis last week. Known right pleural effusion. EXAM: PORTABLE CHEST 1 VIEW COMPARISON:  Portable chest x-ray of June 01, 2016 FINDINGS:  There is a large right pleural effusion occupying 2/3 of the pleural space volume. The left lung is well-expanded and clear without evidence of a pleural effusion. The left heart border appears normal. The pulmonary vascularity is not clearly engorged. IMPRESSION: Large right pleural effusion.  No definite abnormality on the left. Electronically Signed   By: David  Martinique M.D.   On: 07/18/2016 07:19   US Thoracentesis Asp Pleural Space W/img Guide  Result Date: 07/18/2016 INDICATION: RIGHT pleural effusion, shortness of breath EXAM: ULTRASOUND GUIDED DIAGNOSTIC AND THERAPEUTIC RIGHT THORACENTESIS MEDICATIONS: None. COMPLICATIONS: None immediate. PROCEDURE: Procedure, benefits, and risks of procedure were discussed with patient. Written informed consent for procedure was obtained. Time out protocol followed. Pleural effusion localized by ultrasound at the posterior RIGHT hemithorax. Skin prepped and draped in usual sterile fashion. Skin and soft tissues anesthetized with 10 mL of 1% lidocaine. 8 French thoracentesis catheter placed into the RIGHT pleural space. 2.1 L of yellow to amber colored ascitic fluid aspirated by syringe pump. Procedure tolerated well by patient without immediate complication. Patient developed no rash or hives from the procedure; the hives experienced during the previous paracentesis are therefore related to the IV albumin administration as suspected and not related to the lidocaine anesthesia or the chlorhexidine prep. FINDINGS: A total of approximately 2.1 L of RIGHT pleural fluid was removed. Samples were sent to the laboratory as requested by the clinical team. IMPRESSION: Successful ultrasound guided RIGHT thoracentesis yielding 2.1 L of pleural fluid. Electronically Signed   By: Lavonia Dana M.D.   On: 07/18/2016 11:26        Scheduled Meds: . busPIRone  7.5 mg Oral BID  . carvedilol  6.25 mg Oral BID  . FLUoxetine  20 mg Oral Daily  . [START ON 07/20/2016] furosemide  40 mg  Oral Daily  . heparin  5,000 Units Subcutaneous Q8H  . lactulose  20 g Oral BID  . levalbuterol  0.63 mg Nebulization Q6H  . pantoprazole  40 mg Oral BID AC  . sodium chloride flush  3 mL Intravenous Q12H  . spironolactone  25 mg Oral Daily  . sucralfate  1 g Oral QID   Continuous Infusions: . sodium chloride       LOS: 0 days    Time spent: 63mins    MEMON,JEHANZEB, MD [facility] Pager [phone]  If 7PM-7AM, please contact night-coverage [email or web address] Password [email or web address] 07/19/2016, 4:02 PM

## 2016-07-20 ENCOUNTER — Telehealth (INDEPENDENT_AMBULATORY_CARE_PROVIDER_SITE_OTHER): Payer: Self-pay | Admitting: Internal Medicine

## 2016-07-20 ENCOUNTER — Encounter (HOSPITAL_COMMUNITY): Payer: Self-pay

## 2016-07-20 ENCOUNTER — Inpatient Hospital Stay (HOSPITAL_COMMUNITY): Payer: Medicare Other

## 2016-07-20 DIAGNOSIS — K7460 Unspecified cirrhosis of liver: Secondary | ICD-10-CM

## 2016-07-20 DIAGNOSIS — I4891 Unspecified atrial fibrillation: Secondary | ICD-10-CM

## 2016-07-20 DIAGNOSIS — J90 Pleural effusion, not elsewhere classified: Secondary | ICD-10-CM

## 2016-07-20 DIAGNOSIS — J9601 Acute respiratory failure with hypoxia: Secondary | ICD-10-CM

## 2016-07-20 LAB — BASIC METABOLIC PANEL
ANION GAP: 3 — AB (ref 5–15)
BUN: 14 mg/dL (ref 6–20)
CHLORIDE: 102 mmol/L (ref 101–111)
CO2: 32 mmol/L (ref 22–32)
Calcium: 7.8 mg/dL — ABNORMAL LOW (ref 8.9–10.3)
Creatinine, Ser: 1.12 mg/dL (ref 0.61–1.24)
GFR calc non Af Amer: >60 mL/min (ref >60)
Glucose, Bld: 135 mg/dL — ABNORMAL HIGH (ref 65–99)
POTASSIUM: 4.3 mmol/L (ref 3.5–5.1)
SODIUM: 137 mmol/L (ref 135–145)

## 2016-07-20 MED ORDER — FUROSEMIDE 40 MG PO TABS: ORAL_TABLET | ORAL | 3 refills | Status: DC

## 2016-07-20 MED ORDER — LISINOPRIL 2.5 MG PO TABS: 2.5000 mg | ORAL_TABLET | Freq: Every day | ORAL | 1 refills | Status: DC

## 2016-07-20 MED ORDER — FUROSEMIDE 40 MG PO TABS: 40.0000 mg | ORAL_TABLET | Freq: Every day | ORAL | 0 refills | Status: DC

## 2016-07-20 NOTE — Care Management Important Message (Signed)
Important Message  Patient Details  Name: [name] MRN: [id number] Date of Birth: 1960/12/10   Medicare Important Message Given:  Yes    Merrel Crabbe, Chauncey Reading, RN 07/20/2016, 12:52 PM

## 2016-07-20 NOTE — Discharge Summary (Signed)
Physician Discharge Summary  [name]:[id number] DOB: 1960/10/28 DOA: 07/18/2016  PCP: [name], MD  Admit date: 07/18/2016 Discharge date: 07/20/2016  Admitted From: Home Disposition:  Home   Recommendations for Outpatient Follow-up:  1. Follow up with PCP in 1-2 weeks 2. Please obtain BMP in one week 3. For the next thoracocentesis, please send fluid for LDH and cytology  Home Health: No Equipment/Devices:none  Discharge Condition: Stable CODE STATUS: FULL Diet recommendation: Heart Healthy / Carb Modified / Dysphagia / Regular   Brief/Interim Summary: 56 y/o male with history of alcoholic cirrhosis, CHF, presented with shortness of breath and chest pain x 2 days. Found to have a large right pleural effusion. He underwent thoracentesis on admission with removal of 2.1L of fluid. He still had moderate pleural effusion post thoracentesis. He underwent repeat thoracocentesis on 07/20/2016 revealing 2.4 L. He has been on IV lasix for generalized edema and anasarca which has improved.  His edema improved. The patient ambulated without difficulty without any oxygen desaturation prior to discharge.  His furosemide was increased to 40 mg daily from his previous alternating dose of 40-20 milligrams. He will continue on spironolactone. Education was provided on a low-sodium diet as well as fluid restriction. It appeared that the patient had an discretion regarding his fluid intake at home as well as eating processed foods. Analysis of his pleural fluid likely represented a transudate. Unfortunately, LDH was not sent from his pleural fluid.  Should the patient require additional thoracocentesis, placed on the fluid for LDH and check serum LDH.  Discharge Diagnoses:  1. Large right pleural effusion. Suspect this may be related to CHF versus hepatohydrothorax.   At the time of discharge, the pt was clinically euvolemic without JVD.  Patient has undergone thoracentesis x 2  with  removal of 4.5 L  of fluid total which appears to be transudative. Unfortunately, LDH from the pleural fluid was not sent. Should the patient require additional thoracocentesis, place and the fluid for LDH as well as checking serum LDH. Cytology from his pleural fluid on 07/18/2016 was negative for malignant cells. Interventional radiology suspects the patient may need additional thoracocentesis given that he continues to have moderate right-sided effusion even on the post thoracocentesis chest x-ray. This can be set up through his PCP or gastroenterologist.  Prior to discharge, the patient ambulated without difficulty and did not show any oxygen desaturation. Increase lasix to 40 mg daily. 2. Acute respiratory failure with hypoxia. Related to pleural effusion. Continue to wean off oxygen as tolerated.  Prior to discharge, the patient ambulated without difficulty and did not show any oxygen desaturation. 3. Ascites related to cirrhosis. Ultrasound did not show enough fluid for paracentesis. Continue on diuretics.  He had paracentesis on 07/14/16  Removing 4.3L 4. Chronic diastolic CHF. 4/40/10 Echo--Ejection fraction of 40-45%. Overall peripheral edema is improving. Since blood pressures are running low, will change diuretics to po. He is already on beta blockers.  Decrease lisinopril to 2.5 mg daily due to soft BP 5. Chest pain. Suspect this is related to large pleural effusion. He has ruled out for ACS with negative cardiac markers. 6. Atrial fibrillation. Not a candidate for anticoagulation due to liver disease and prior history of GI bleeding. Continue on beta blockers 7. Hyperlipidemia. Continue statin 8. Hypertension. Continue beta blockers. Hold ace inhibitors due to low blood pressures.    Discharge Instructions  Discharge Instructions    Diet - low sodium heart healthy    Complete  by:  As directed    Increase activity slowly    Complete by:  As directed      Allergies as of 07/20/2016       Reactions   Albumin (human) Hives, Itching   Bee Venom Anaphylaxis, Nausea Only   Bee Venom Anaphylaxis, Nausea And Vomiting, Swelling   Throat swelling   Other Hives   Pt states there was a fluid through IV he was getting while getting fluid taken off his stomach. It gave him hives      Medication List    TAKE these medications   busPIRone 7.5 MG tablet Commonly known as:  BUSPAR Take 1 tablet (7.5 mg total) by mouth 2 (two) times daily. FOR ANXIETY   CARAFATE 1 GM/10ML suspension Generic drug:  sucralfate Take 10 mLs by mouth 4 (four) times daily.   carvedilol 6.25 MG tablet Commonly known as:  COREG Take 1 tablet (6.25 mg total) by mouth 2 (two) times daily.   EPINEPHrine 0.3 mg/0.3 mL Soaj injection Commonly known as:  EPI-PEN Inject 0.3 mLs (0.3 mg total) into the muscle once.   FLUoxetine 20 MG tablet Commonly known as:  PROZAC Take 1 tablet (20 mg total) by mouth daily.   furosemide 40 MG tablet Commonly known as:  LASIX Take 40 mg (1 tablet) daily What changed:  additional instructions   lactulose 10 GM/15ML solution Commonly known as:  CHRONULAC TAKE 22.5ML BY MOUTH TWICE DAILY   lisinopril 2.5 MG tablet Commonly known as:  PRINIVIL,ZESTRIL Take 1 tablet (2.5 mg total) by mouth daily. What changed:  medication strength  how much to take   omeprazole 20 MG capsule Commonly known as:  PRILOSEC Take 1 capsule (20 mg total) by mouth 2 (two) times daily.   spironolactone 25 MG tablet Commonly known as:  ALDACTONE Take 1 tablet (25 mg total) by mouth daily.   thiamine 50 MG tablet Commonly known as:  VITAMIN B-1 Take by mouth daily.       Allergies  Allergen Reactions  . Albumin (Human) Hives and Itching  . Bee Venom Anaphylaxis and Nausea Only  . Bee Venom Anaphylaxis, Nausea And Vomiting and Swelling    Throat swelling  . Other Hives    Pt states there was a fluid through IV he was getting while getting fluid taken off his stomach. It gave  him hives    Consultations:  IR   Procedures/Studies: Dg Chest 1 View  Result Date: 07/20/2016 CLINICAL DATA:  Status post thoracentesis EXAM: CHEST 1 VIEW COMPARISON:  July 18, 2016 FINDINGS: There is no appreciable pneumothorax. There remains moderate pleural effusion with consolidation in the right mid and lower lung zone regions. Left lung is clear. Heart size and pulmonary vascularity are normal. No adenopathy. No bone lesions. IMPRESSION: Persistent moderate right pleural effusion with right mid lower lung zone consolidation. No new opacity. No pneumothorax. Stable cardiac silhouette. Electronically Signed   By: Lowella Grip III M.D.   On: 07/20/2016 09:21   Dg Chest 1 View  Result Date: 07/18/2016 CLINICAL DATA:  RIGHT pleural effusion post thoracentesis EXAM: CHEST 1 VIEW COMPARISON:  CT chest 07/18/2016 FINDINGS: Upper normal size of cardiac silhouette. Mediastinal contours and pulmonary vascularity normal. Moderate to large RIGHT pleural effusion remains despite removal of 2.1 L of fluid from the RIGHT hemithorax. Associated RIGHT basilar atelectasis. No pneumothorax. LEFT lung clear. IMPRESSION: Persistent moderate to large RIGHT pleural is fusion and basilar atelectasis despite pre seeding thoracentesis removing 2.1  L of fluid. No pneumothorax. Patient currently asymptomatic. Electronically Signed   By: Lavonia Dana M.D.   On: 07/18/2016 11:32   Ct Chest W Contrast  Result Date: 07/18/2016 CLINICAL DATA:  56 year old hypertensive alcoholic male with shortness of breath. Subsequent encounter. EXAM: CT CHEST WITH CONTRAST TECHNIQUE: Multidetector CT imaging of the chest was performed during intravenous contrast administration. CONTRAST:  72mL ISOVUE-300 IOPAMIDOL (ISOVUE-300) INJECTION 61% COMPARISON:  07/18/2016 and 04/01/2016 chest x-ray. 07/14/2016 ultrasound. FINDINGS: Cardiovascular: Mild motion degradation. No central pulmonary embolus or aortic dissection. Coronary artery  calcifications.  Trace pericardial fluid. Ascending thoracic aorta measures up to 3.5 cm. Trace calcification right subclavian artery. Trace calcification descending thoracic aorta. Mediastinum/Nodes: Tiny mediastinal/hilar lymph nodes without adenopathy. Prominent varices surround the lower esophagus. Lungs/Pleura: Large right-sided pleural effusion with atelectasis of right lung without central obstructing lesion. 3.4 mm nodule right upper lobe adjacent to small sensory fissure (series 6, image 149). Minimal basilar atelectatic changes. Upper Abdomen: Cirrhotic liver. Prominent varices upper abdomen splenic/ esophageal and upper gastric region. Not able to confirm portal vein is patent and there are prominent vessels in this region which may represent cavernous transformation. Musculoskeletal: Degenerative changes cervical spine and thoracic spine. T12 remote anterior wedge compression deformities superior endplate Schmorl's node deformity. Mild gynecomastia. IMPRESSION: Large right-sided pleural effusion with atelectasis of right lung without central obstructing lesion. Cirrhotic liver with prominent varices surrounding the lower esophagus, gastroesophageal junction, upper stomach and in the region of the splenic hilum. 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 Coronary artery calcifications. Scattered trace aortic/great vessel calcified plaque. 3.4 mm nodule left upper lobe (series 6, image 149). No follow-up needed if patient is low-risk. Non-contrast chest CT can be considered in 12 months if patient is high-risk. This recommendation follows the consensus statement: Guidelines for Management of Incidental Pulmonary Nodules Detected on CT Images: From the Fleischner Society 2017; Radiology 2017; 284:228-243. Electronically Signed   By: Genia Del M.D.   On: 07/18/2016 09:34   US Abdomen  Limited  Result Date: 07/19/2016 CLINICAL DATA:  Alcoholic cirrhosis, ascites, paracentesis yesterday. EXAM: LIMITED ABDOMEN ULTRASOUND FOR ASCITES TECHNIQUE: Limited ultrasound survey for ascites was performed in all four abdominal quadrants. COMPARISON:  Right upper quadrant ultrasound of Jul 14, 2016. FINDINGS: There is ascites in the right lower quadrant with a small volume in the left lower quadrant. The volume of ascites has decreased since the paracentesis yesterday. Liver: Liver appears shrunken and its surface contour irregular where visualized. IMPRESSION: Cirrhotic changes within the liver. Small amount of ascites in the lower quadrants bilaterally. Electronically Signed   By: Sheccid Lahmann  [place] M.D.   On: 07/19/2016 10:18   US Paracentesis  Result Date: 07/14/2016 INDICATION: Alcoholic cirrhosis, ascites EXAM: ULTRASOUND GUIDED DIAGNOSTIC AND THERAPEUTIC PARACENTESIS MEDICATIONS: None. COMPLICATIONS: None immediate. PROCEDURE: Procedure, benefits, and risks of procedure were discussed with patient. Written informed consent for procedure was obtained. Time out protocol followed. Adequate collection of ascites localized by ultrasound in RIGHT lower quadrant. Skin prepped and draped in usual sterile fashion. Skin and soft tissues anesthetized with 10 mL of 1% lidocaine. 5 [place] Yueh catheter placed into peritoneal cavity. 4.3 L of yellow colored ascitic fluid aspirated by vacuum bottle suction. Patient broke out with hives about 15 minutes into the procedure. This was felt to be most likely related to the IV infusion of albumin which began during the procedure. Vital signs remained stable. Patient was  treated with 50 mg of Benadryl IV with incomplete clearance of the hives and the development of multiple additional hives over the 30-45 minutes after Benadryl administration. Patient was then treated with 125 mg of Solu-Medrol IV with resolution of hives. FINDINGS: A total of approximately 4.3 L of ascitic  fluid was removed. Samples were sent to the laboratory as requested by the clinical team. IMPRESSION: Successful ultrasound-guided paracentesis yielding 4.3 liters of peritoneal fluid. Development of hives likely related to IV albumin administration, treated with 50 mg of IV Benadryl unsuccessfully and 125 mg of IV Solu-Medrol successfully. Electronically Signed   By: Lavonia Dana M.D.   On: 07/14/2016 10:47   Dg Chest Portable 1 View  Result Date: 07/18/2016 CLINICAL DATA:  Mid chest pain and shortness of breath since yesterday. Paracentesis last week. Known right pleural effusion. EXAM: PORTABLE CHEST 1 VIEW COMPARISON:  Portable chest x-ray of June 01, 2016 FINDINGS: There is a large right pleural effusion occupying 2/3 of the pleural space volume. The left lung is well-expanded and clear without evidence of a pleural effusion. The left heart border appears normal. The pulmonary vascularity is not clearly engorged. IMPRESSION: Large right pleural effusion.  No definite abnormality on the left. Electronically Signed   By: Ashad Fawbush  [place] M.D.   On: 07/18/2016 07:19   US Abdomen Limited Ruq  Result Date: 07/14/2016 CLINICAL DATA:  Alcoholic cirrhosis.  Ascites. EXAM: US ABDOMEN LIMITED - RIGHT UPPER QUADRANT COMPARISON:  Abdominal ultrasound 08/03/2015 FINDINGS: Gallbladder: No gallstones. Borderline to mild chronic gallbladder wall thickening, likely related to chronic liver disease. No sonographic Murphy sign noted by sonographer. Common bile duct: Diameter: 4 mm Liver: Nodular, shrunken appearing liver with coarsened echotexture. No focal lesion identified. Partially visualized ascites and right pleural effusion. IMPRESSION: 1. Cirrhosis without focal liver lesion identified. 2. Ascites. Electronically Signed   By: Logan Bores M.D.   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 Significant right pleural effusion. Recently had 2.1 L removed from the right pleural space 2 days ago. Request is made for therapeutic thoracentesis. EXAM: ULTRASOUND GUIDED THERAPEUTIC THORACENTESIS MEDICATIONS: 1% lidocaine COMPLICATIONS: None immediate. PROCEDURE: An ultrasound guided thoracentesis was thoroughly discussed with the patient and questions answered. The benefits, risks, alternatives and complications were also discussed. The patient understands and wishes to proceed with the procedure. Written consent was obtained. Ultrasound was performed to localize and mark an adequate pocket of fluid in the right chest. The area was then prepped and draped in the normal sterile fashion. 1% Lidocaine was used for local anesthesia. Under ultrasound guidance an 8 French thoracentesis catheter was introduced. Thoracentesis was performed. The catheter was removed and a dressing applied. FINDINGS: A total of approximately 2.4 L of serous fluid was removed. IMPRESSION: Successful ultrasound guided right thoracentesis yielding 2.4 L of pleural fluid. The procedure was terminated as this is around the maximum amount we takeoff at one time for thoracentesis. The patient still has significant pleural fluid noted on his postprocedure image. 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 This was discussed with Dr. [name] so that he is aware the patient will likely need further thoracenteses. Electronically Signed   By: Lavonia Dana M.D.   On: 07/20/2016 09:20   US Thoracentesis Asp Pleural Space W/img Guide  Result Date: 07/18/2016 INDICATION: RIGHT pleural effusion, shortness of  breath EXAM: ULTRASOUND GUIDED DIAGNOSTIC AND THERAPEUTIC RIGHT THORACENTESIS MEDICATIONS: None. COMPLICATIONS: None immediate. PROCEDURE: Procedure, benefits, and risks of procedure were discussed with patient. Written informed consent for procedure was obtained. Time out protocol followed. Pleural  effusion localized by ultrasound at the posterior RIGHT hemithorax. Skin prepped and draped in usual sterile fashion. Skin and soft tissues anesthetized with 10 mL of 1% lidocaine. 8 French thoracentesis catheter placed into the RIGHT pleural space. 2.1 L of yellow to amber colored ascitic fluid aspirated by syringe pump. Procedure tolerated well by patient without immediate complication. Patient developed no rash or hives from the procedure; the hives experienced during the previous paracentesis are therefore related to the IV albumin administration as suspected and not related to the lidocaine anesthesia or the chlorhexidine prep. FINDINGS: A total of approximately 2.1 L of RIGHT pleural fluid was removed. Samples were sent to the laboratory as requested by the clinical team. IMPRESSION: Successful ultrasound guided RIGHT thoracentesis yielding 2.1 L of pleural fluid. Electronically Signed   By: Lavonia Dana M.D.   On: 07/18/2016 11:26        Discharge Exam: Vitals:   07/20/16 0821 07/20/16 0856  BP: 116/70 (!) 90/56  Pulse: 90 82  Resp: 20 20  Temp:     Vitals:   07/20/16 0524 07/20/16 0743 07/20/16 0821 07/20/16 0856  BP: (!) 95/57  116/70 (!) 90/56  Pulse: 93  90 82  Resp:   20 20  Temp: 98.3 F (36.8 C)     TempSrc:      SpO2: 94% 98% 98% 98%  Weight: 93.1 kg (205 lb 4.8 oz)     Height:        General: Pt is alert, awake, not in acute distress Cardiovascular: RRR, S1/S2 +, no rubs, no gallops Respiratory: CTA bilaterally, no wheezing, no rhonchi Abdominal: Soft, NT, ND, bowel sounds + Extremities: no edema, no cyanosis   The results of significant diagnostics from this hospitalization (including imaging, microbiology, ancillary and laboratory) are listed below for reference.    Significant Diagnostic Studies: Dg Chest 1 View  Result Date: 07/20/2016 CLINICAL DATA:  Status post thoracentesis EXAM: CHEST 1 VIEW COMPARISON:  July 18, 2016 FINDINGS: There is no appreciable  pneumothorax. There remains moderate pleural effusion with consolidation in the right mid and lower lung zone regions. Left lung is clear. Heart size and pulmonary vascularity are normal. No adenopathy. No bone lesions. IMPRESSION: Persistent moderate right pleural effusion with right mid lower lung zone consolidation. No new opacity. No pneumothorax. Stable cardiac silhouette. Electronically Signed   By: Lowella Grip III M.D.   On: 07/20/2016 09:21   Dg Chest 1 View  Result Date: 07/18/2016 CLINICAL DATA:  RIGHT pleural effusion post thoracentesis EXAM: CHEST 1 VIEW COMPARISON:  CT chest 07/18/2016 FINDINGS: Upper normal size of cardiac silhouette. Mediastinal contours and pulmonary vascularity normal. Moderate to large RIGHT pleural effusion remains despite removal of 2.1 L of fluid from the RIGHT hemithorax. Associated RIGHT basilar atelectasis. No pneumothorax. LEFT lung clear. IMPRESSION: Persistent moderate to large RIGHT pleural is fusion and basilar atelectasis despite pre seeding thoracentesis removing 2.1 L of fluid. No pneumothorax. Patient currently asymptomatic. Electronically Signed   By: Lavonia Dana M.D.   On: 07/18/2016 11:32   Ct Chest W Contrast  Result Date: 07/18/2016 CLINICAL DATA:  56 year old hypertensive alcoholic male with shortness of breath. Subsequent encounter. EXAM: CT CHEST WITH CONTRAST TECHNIQUE: Multidetector CT imaging of the chest was performed during intravenous contrast  administration. CONTRAST:  47mL ISOVUE-300 IOPAMIDOL (ISOVUE-300) INJECTION 61% COMPARISON:  07/18/2016 and 04/01/2016 chest x-ray. 07/14/2016 ultrasound. FINDINGS: Cardiovascular: Mild motion degradation. No central pulmonary embolus or aortic dissection. Coronary artery calcifications.  Trace pericardial fluid. Ascending thoracic aorta measures up to 3.5 cm. Trace calcification right subclavian artery. Trace calcification descending thoracic aorta. Mediastinum/Nodes: Tiny mediastinal/hilar lymph  nodes without adenopathy. Prominent varices surround the lower esophagus. Lungs/Pleura: Large right-sided pleural effusion with atelectasis of right lung without central obstructing lesion. 3.4 mm nodule right upper lobe adjacent to small sensory fissure (series 6, image 149). Minimal basilar atelectatic changes. Upper Abdomen: Cirrhotic liver. Prominent varices upper abdomen splenic/ esophageal and upper gastric region. Not able to confirm portal vein is patent and there are prominent vessels in this region which may represent cavernous transformation. Musculoskeletal: Degenerative changes cervical spine and thoracic spine. T12 remote anterior wedge compression deformities superior endplate Schmorl's node deformity. Mild gynecomastia. IMPRESSION: Large right-sided pleural effusion with atelectasis of right lung without central obstructing lesion. Cirrhotic liver with prominent varices surrounding the lower esophagus, gastroesophageal junction, upper stomach and in the region of the splenic hilum. 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 Coronary artery calcifications. Scattered trace aortic/great vessel calcified plaque. 3.4 mm nodule left upper lobe (series 6, image 149). No follow-up needed if patient is low-risk. Non-contrast chest CT can be considered in 12 months if patient is high-risk. This recommendation follows the consensus statement: Guidelines for Management of Incidental Pulmonary Nodules Detected on CT Images: From the Fleischner Society 2017; Radiology 2017; 284:228-243. Electronically Signed   By: Genia Del M.D.   On: 07/18/2016 09:34   US Abdomen Limited  Result Date: 07/19/2016 CLINICAL DATA:  Alcoholic cirrhosis, ascites, paracentesis yesterday. EXAM: LIMITED ABDOMEN ULTRASOUND FOR ASCITES TECHNIQUE: Limited ultrasound survey for ascites was performed in all four abdominal  quadrants. COMPARISON:  Right upper quadrant ultrasound of Jul 14, 2016. FINDINGS: There is ascites in the right lower quadrant with a small volume in the left lower quadrant. The volume of ascites has decreased since the paracentesis yesterday. Liver: Liver appears shrunken and its surface contour irregular where visualized. IMPRESSION: Cirrhotic changes within the liver. Small amount of ascites in the lower quadrants bilaterally. Electronically Signed   By: Breion Novacek  [place] M.D.   On: 07/19/2016 10:18   US Paracentesis  Result Date: 07/14/2016 INDICATION: Alcoholic cirrhosis, ascites EXAM: ULTRASOUND GUIDED DIAGNOSTIC AND THERAPEUTIC PARACENTESIS MEDICATIONS: None. COMPLICATIONS: None immediate. PROCEDURE: Procedure, benefits, and risks of procedure were discussed with patient. Written informed consent for procedure was obtained. Time out protocol followed. Adequate collection of ascites localized by ultrasound in RIGHT lower quadrant. Skin prepped and draped in usual sterile fashion. Skin and soft tissues anesthetized with 10 mL of 1% lidocaine. 5 [place] Yueh catheter placed into peritoneal cavity. 4.3 L of yellow colored ascitic fluid aspirated by vacuum bottle suction. Patient broke out with hives about 15 minutes into the procedure. This was felt to be most likely related to the IV infusion of albumin which began during the procedure. Vital signs remained stable. Patient was treated with 50 mg of Benadryl IV with incomplete clearance of the hives and the development of multiple additional hives over the 30-45 minutes after Benadryl administration. Patient was then treated with 125 mg of Solu-Medrol IV with resolution of hives. FINDINGS: A total of approximately 4.3 L of ascitic fluid was removed. Samples were sent to the laboratory as requested by  the clinical team. IMPRESSION: Successful ultrasound-guided paracentesis yielding 4.3 liters of peritoneal fluid. Development of hives likely related to IV albumin  administration, treated with 50 mg of IV Benadryl unsuccessfully and 125 mg of IV Solu-Medrol successfully. Electronically Signed   By: Lavonia Dana M.D.   On: 07/14/2016 10:47   Dg Chest Portable 1 View  Result Date: 07/18/2016 CLINICAL DATA:  Mid chest pain and shortness of breath since yesterday. Paracentesis last week. Known right pleural effusion. EXAM: PORTABLE CHEST 1 VIEW COMPARISON:  Portable chest x-ray of June 01, 2016 FINDINGS: There is a large right pleural effusion occupying 2/3 of the pleural space volume. The left lung is well-expanded and clear without evidence of a pleural effusion. The left heart border appears normal. The pulmonary vascularity is not clearly engorged. IMPRESSION: Large right pleural effusion.  No definite abnormality on the left. Electronically Signed   By: Luma Clopper  [place] M.D.   On: 07/18/2016 07:19   US Abdomen Limited Ruq  Result Date: 07/14/2016 CLINICAL DATA:  Alcoholic cirrhosis.  Ascites. EXAM: US ABDOMEN LIMITED - RIGHT UPPER QUADRANT COMPARISON:  Abdominal ultrasound 08/03/2015 FINDINGS: Gallbladder: No gallstones. Borderline to mild chronic gallbladder wall thickening, likely related to chronic liver disease. No sonographic Murphy sign noted by sonographer. Common bile duct: Diameter: 4 mm Liver: Nodular, shrunken appearing liver with coarsened echotexture. No focal lesion identified. Partially visualized ascites and right pleural effusion. IMPRESSION: 1. Cirrhosis without focal liver lesion identified. 2. Ascites. Electronically Signed   By: Logan Bores M.D.   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 Significant right pleural effusion. Recently had 2.1 L removed from the right pleural space 2 days ago. Request is made for therapeutic thoracentesis. EXAM: ULTRASOUND GUIDED THERAPEUTIC THORACENTESIS MEDICATIONS: 1%  lidocaine COMPLICATIONS: None immediate. PROCEDURE: An ultrasound guided thoracentesis was thoroughly discussed with the patient and questions answered. The benefits, risks, alternatives and complications were also discussed. The patient understands and wishes to proceed with the procedure. Written consent was obtained. Ultrasound was performed to localize and mark an adequate pocket of fluid in the right chest. The area was then prepped and draped in the normal sterile fashion. 1% Lidocaine was used for local anesthesia. Under ultrasound guidance an 8 French thoracentesis catheter was introduced. Thoracentesis was performed. The catheter was removed and a dressing applied. FINDINGS: A total of approximately 2.4 L of serous fluid was removed. IMPRESSION: Successful ultrasound guided right thoracentesis yielding 2.4 L of pleural fluid. The procedure was terminated as this is around the maximum amount we takeoff at one time for thoracentesis. The patient still has significant pleural fluid noted on his postprocedure image. 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 This was discussed with Dr. [name] so that he is aware the patient will likely need further thoracenteses. Electronically Signed   By: Lavonia Dana M.D.   On: 07/20/2016 09:20   US Thoracentesis Asp Pleural Space W/img Guide  Result Date: 07/18/2016 INDICATION: RIGHT pleural effusion, shortness of breath EXAM: ULTRASOUND GUIDED DIAGNOSTIC AND THERAPEUTIC RIGHT THORACENTESIS MEDICATIONS: None. COMPLICATIONS: None immediate. PROCEDURE: Procedure, benefits, and risks of procedure were discussed with patient. Written informed consent for procedure was obtained. Time out protocol followed. Pleural effusion localized by ultrasound at the posterior RIGHT hemithorax. Skin prepped and draped in usual sterile fashion. Skin and soft tissues anesthetized with 10 mL  of 1% lidocaine. 8 French thoracentesis catheter placed into the RIGHT  pleural space. 2.1 L of yellow to amber colored ascitic fluid aspirated by syringe pump. Procedure tolerated well by patient without immediate complication. Patient developed no rash or hives from the procedure; the hives experienced during the previous paracentesis are therefore related to the IV albumin administration as suspected and not related to the lidocaine anesthesia or the chlorhexidine prep. FINDINGS: A total of approximately 2.1 L of RIGHT pleural fluid was removed. Samples were sent to the laboratory as requested by the clinical team. IMPRESSION: Successful ultrasound guided RIGHT thoracentesis yielding 2.1 L of pleural fluid. Electronically Signed   By: Lavonia Dana M.D.   On: 07/18/2016 11:26     Microbiology: Recent Results (from the past 240 hour(s))  Gram stain     Status: None   Collection Time: 07/14/16  9:07 AM  Result Value Ref Range Status   Specimen Description ASCITIC  Final   Special Requests NONE  Final   Gram Stain   Final    RARE WBC PRESENT, PREDOMINANTLY MONONUCLEAR NO ORGANISMS SEEN Performed at [facility], [address] [place], [place] [address]    Report Status 07/14/2016 FINAL  Final  Culture, body fluid-bottle     Status: None   Collection Time: 07/14/16  9:07 AM  Result Value Ref Range Status   Specimen Description ASCITIC  Final   Special Requests BOTTLES DRAWN AEROBIC AND ANAEROBIC 6CC  Final   Culture NO GROWTH 5 DAYS  Final   Report Status 07/19/2016 FINAL  Final  Culture, body fluid-bottle     Status: None (Preliminary result)   Collection Time: 07/18/16 10:45 AM  Result Value Ref Range Status   Specimen Description PLEURAL RIGHT COLLECTED BY DOCTOR  Final   Special Requests BOTTLES DRAWN AEROBIC AND ANAEROBIC 10CC EACH  Final   Culture NO GROWTH 2 DAYS  Final   Report Status PENDING  Incomplete  Gram stain     Status: None   Collection Time: 07/18/16 10:45 AM  Result Value Ref Range Status   Specimen Description PLEURAL RIGHT   Final   Special Requests NONE  Final   Gram Stain   Final    WBC PRESENT,BOTH PMN AND MONONUCLEAR NO ORGANISMS SEEN CYTOSPIN SMEAR Performed at [facility], [address] [place], [place] [address]    Report Status 07/18/2016 FINAL  Final  MRSA PCR Screening     Status: None   Collection Time: 07/18/16  5:39 PM  Result Value Ref Range Status   MRSA by PCR NEGATIVE NEGATIVE Final    Comment:        The GeneXpert MRSA Assay (FDA approved for NASAL specimens only), is one component of a comprehensive MRSA colonization surveillance program. It is not intended to diagnose MRSA infection nor to guide or monitor treatment for MRSA infections.      Labs: Basic Metabolic Panel:  Recent Labs Lab 07/18/16 0652 07/19/16 0445 07/20/16 0608  NA 135 134* 137  K 3.7 3.8 4.3  CL 100* 101 102  CO2 28 28 32  GLUCOSE 124* 149* 135*  BUN 13 15 14   CREATININE 0.79 1.11 1.12  CALCIUM 8.4* 7.6* 7.8*   Liver Function Tests:  Recent Labs Lab 07/18/16 0650  AST 56*  ALT 25  ALKPHOS 134*  BILITOT 2.9*  PROT 7.1  ALBUMIN 2.5*   No results for input(s): LIPASE, AMYLASE in the last 168 hours. No results  for input(s): AMMONIA in the last 168 hours. CBC:  Recent Labs Lab 07/18/16 0652  WBC 7.2  HGB 13.9  HCT 39.2  MCV 100.5*  PLT 116*   Cardiac Enzymes:  Recent Labs Lab 07/18/16 1845 07/18/16 2255 07/19/16 0445  TROPONINI <0.03 <0.03 <0.03   BNP: Invalid input(s): POCBNP CBG: No results for input(s): GLUCAP in the last 168 hours.  Time coordinating discharge:  Greater than 30 minutes  Signed:  Latavion Halls, DO [facility] Pager: [phone] 07/20/2016, 11:42 AM

## 2016-07-20 NOTE — Progress Notes (Signed)
Pt discharged home today per MD. Pt's IV site D/C'd and WDL. Pt's VSS. Pt provided with home medication list, discharge instructions and prescriptions. Verbalized understanding. Pt left floor via WC in stable condition accompanied by NT. 

## 2016-07-20 NOTE — Telephone Encounter (Signed)
Patient presented to the office today and stated that he was discharged today from [facility].   He stated that he had a tap on Monday, 07/18/16 and today, 07/21/15.  He stated that the hospital told him to come and request a tap for next week.  [phone]

## 2016-07-20 NOTE — Procedures (Signed)
Ultrasound-guided therapeutic right thoracentesis performed yielding 2.4liters of serous colored fluid. No immediate complications. Follow-up chest x-ray pending.  We stopped at 2.4L for a max in the chest.  Keynan Heffern E 9:12 AM 07/20/2016

## 2016-07-20 NOTE — Progress Notes (Signed)
Thoracentesis complete no signs of distress. 2.4L yellow colored pleural fluid removed.

## 2016-07-22 NOTE — Telephone Encounter (Signed)
Message left on answering machine 

## 2016-07-23 LAB — CULTURE, BODY FLUID W GRAM STAIN -BOTTLE: Culture: NO GROWTH

## 2016-07-23 LAB — CULTURE, BODY FLUID-BOTTLE

## 2016-07-25 NOTE — Telephone Encounter (Signed)
Patient is not at home. Will talk to him when he call office. I spoke with wife today

## 2016-07-27 ENCOUNTER — Other Ambulatory Visit: Payer: Self-pay | Admitting: Family Medicine

## 2016-08-01 ENCOUNTER — Telehealth (INDEPENDENT_AMBULATORY_CARE_PROVIDER_SITE_OTHER): Payer: Self-pay | Admitting: Internal Medicine

## 2016-08-01 ENCOUNTER — Telehealth: Payer: Self-pay | Admitting: *Deleted

## 2016-08-01 NOTE — Telephone Encounter (Signed)
I advised him we do not do thoracentesis at this office.

## 2016-08-01 NOTE — Telephone Encounter (Signed)
Received call from patient.   States that he has had ascites tapped several times, but requires appointment to be scheduled before he can have this done.   Advised to contact GI as they are scheduling appointment. States that he has attempted to contact office with no return call .   Call placed to GI office and left message on VM. Please contact patient at [phone].

## 2016-08-01 NOTE — Telephone Encounter (Signed)
Asian called the office numerous times. States that he walks fluid drawn from his lungs. He's had thoracentesis twice on admission secondary to volume overload as well as abdominal paracentesis secondary to his cirrhosis and ascites. He called discussion to neurologist they state that they do not write orders for the paracentesis I also cannot write orders per thoracic paracentesis. He states that he is having more trouble breathing recommended that he just go directly to the emergency room to be evaluated and see if this is needed he had significant cough as well he's been in now the hospital he needs evaluation which he does not have pneumonia. We can get him set up with pulmonology if he's got a require continuous thoracentesis so that his fluid levels can be monitored adequately.

## 2016-08-01 NOTE — Telephone Encounter (Signed)
Patient called, he left a message stating he needs to get the fluid drawn off and would like to have it done tomorrow morning.  [phone]

## 2016-08-02 ENCOUNTER — Telehealth: Payer: Self-pay | Admitting: Cardiology

## 2016-08-02 NOTE — Telephone Encounter (Signed)
Pt is needing to speak w/ a nurse about having fluid drawn off of the pt and having Dr. Nelly Laurence permission [phone]

## 2016-08-02 NOTE — Telephone Encounter (Signed)
This has been addressed.

## 2016-08-02 NOTE — Telephone Encounter (Signed)
There is no reason from a heart standpoint he cannot have fluid drawn from either his stomach or lungs if his other doctors feel that is appropriate.   Zandra Abts MD

## 2016-08-02 NOTE — Telephone Encounter (Signed)
LM with Dr Nelly Laurence message

## 2016-08-02 NOTE — Telephone Encounter (Signed)
Could not reach pt, looks like he has ascites and had thoracentesis in past

## 2016-08-03 ENCOUNTER — Emergency Department (HOSPITAL_COMMUNITY): Payer: Medicare Other

## 2016-08-03 ENCOUNTER — Inpatient Hospital Stay: Payer: Medicare Other | Admitting: Family Medicine

## 2016-08-03 ENCOUNTER — Inpatient Hospital Stay (HOSPITAL_COMMUNITY)
Admission: EM | Admit: 2016-08-03 | Discharge: 2016-08-05 | DRG: 293 | Disposition: A | Discharge status: Home / Self Care | Payer: Medicare Other | Attending: Internal Medicine | Admitting: Internal Medicine

## 2016-08-03 ENCOUNTER — Encounter (HOSPITAL_COMMUNITY): Payer: Self-pay | Admitting: Emergency Medicine

## 2016-08-03 DIAGNOSIS — Z79899 Other long term (current) drug therapy: Secondary | ICD-10-CM

## 2016-08-03 DIAGNOSIS — I5043 Acute on chronic combined systolic (congestive) and diastolic (congestive) heart failure: Secondary | ICD-10-CM | POA: Diagnosis present

## 2016-08-03 DIAGNOSIS — M5137 Other intervertebral disc degeneration, lumbosacral region: Secondary | ICD-10-CM | POA: Diagnosis present

## 2016-08-03 DIAGNOSIS — I420 Dilated cardiomyopathy: Secondary | ICD-10-CM | POA: Diagnosis present

## 2016-08-03 DIAGNOSIS — K7031 Alcoholic cirrhosis of liver with ascites: Secondary | ICD-10-CM | POA: Diagnosis present

## 2016-08-03 DIAGNOSIS — I110 Hypertensive heart disease with heart failure: Secondary | ICD-10-CM | POA: Diagnosis not present

## 2016-08-03 DIAGNOSIS — K7460 Unspecified cirrhosis of liver: Secondary | ICD-10-CM | POA: Diagnosis present

## 2016-08-03 DIAGNOSIS — G894 Chronic pain syndrome: Secondary | ICD-10-CM | POA: Diagnosis present

## 2016-08-03 DIAGNOSIS — Z9889 Other specified postprocedural states: Secondary | ICD-10-CM

## 2016-08-03 DIAGNOSIS — F419 Anxiety disorder, unspecified: Secondary | ICD-10-CM | POA: Diagnosis not present

## 2016-08-03 DIAGNOSIS — Z8673 Personal history of transient ischemic attack (TIA), and cerebral infarction without residual deficits: Secondary | ICD-10-CM

## 2016-08-03 DIAGNOSIS — F1011 Alcohol abuse, in remission: Secondary | ICD-10-CM

## 2016-08-03 DIAGNOSIS — Z888 Allergy status to other drugs, medicaments and biological substances status: Secondary | ICD-10-CM

## 2016-08-03 DIAGNOSIS — I4891 Unspecified atrial fibrillation: Secondary | ICD-10-CM | POA: Diagnosis present

## 2016-08-03 DIAGNOSIS — F329 Major depressive disorder, single episode, unspecified: Secondary | ICD-10-CM | POA: Diagnosis present

## 2016-08-03 DIAGNOSIS — Z91030 Bee allergy status: Secondary | ICD-10-CM

## 2016-08-03 DIAGNOSIS — Z9109 Other allergy status, other than to drugs and biological substances: Secondary | ICD-10-CM

## 2016-08-03 DIAGNOSIS — J90 Pleural effusion, not elsewhere classified: Secondary | ICD-10-CM | POA: Diagnosis present

## 2016-08-03 DIAGNOSIS — I10 Essential (primary) hypertension: Secondary | ICD-10-CM | POA: Diagnosis present

## 2016-08-03 DIAGNOSIS — E785 Hyperlipidemia, unspecified: Secondary | ICD-10-CM | POA: Diagnosis present

## 2016-08-03 DIAGNOSIS — R0602 Shortness of breath: Secondary | ICD-10-CM

## 2016-08-03 DIAGNOSIS — R188 Other ascites: Secondary | ICD-10-CM

## 2016-08-03 LAB — CBC WITH DIFFERENTIAL/PLATELET
BASOS ABS: 0 10*3/uL (ref 0.0–0.1)
Basophils Relative: 1 %
Eosinophils Absolute: 0.2 10*3/uL (ref 0.0–0.7)
Eosinophils Relative: 3 %
HCT: 39.4 % (ref 39.0–52.0)
HEMOGLOBIN: 13.6 g/dL (ref 13.0–17.0)
LYMPHS ABS: 1.6 10*3/uL (ref 0.7–4.0)
LYMPHS PCT: 29 %
MCH: 35.7 pg — ABNORMAL HIGH (ref 26.0–34.0)
MCHC: 34.5 g/dL (ref 30.0–36.0)
MCV: 103.4 fL — AB (ref 78.0–100.0)
Monocytes Absolute: 0.9 10*3/uL (ref 0.1–1.0)
Monocytes Relative: 16 %
NEUTROS ABS: 3 10*3/uL (ref 1.7–7.7)
NEUTROS PCT: 52 %
Platelets: 88 10*3/uL — ABNORMAL LOW (ref 150–400)
RBC: 3.81 MIL/uL — AB (ref 4.22–5.81)
RDW: 14.9 % (ref 11.5–15.5)
WBC: 5.8 10*3/uL (ref 4.0–10.5)

## 2016-08-03 LAB — COMPREHENSIVE METABOLIC PANEL
ALK PHOS: 134 U/L — AB (ref 38–126)
ALT: 23 U/L (ref 17–63)
AST: 54 U/L — AB (ref 15–41)
Albumin: 2.2 g/dL — ABNORMAL LOW (ref 3.5–5.0)
Anion gap: 9 (ref 5–15)
BUN: 9 mg/dL (ref 6–20)
CALCIUM: 8.1 mg/dL — AB (ref 8.9–10.3)
CO2: 26 mmol/L (ref 22–32)
CREATININE: 0.89 mg/dL (ref 0.61–1.24)
Chloride: 96 mmol/L — ABNORMAL LOW (ref 101–111)
GFR calc Af Amer: >60 mL/min (ref >60)
Glucose, Bld: 113 mg/dL — ABNORMAL HIGH (ref 65–99)
Potassium: 3.4 mmol/L — ABNORMAL LOW (ref 3.5–5.1)
Sodium: 131 mmol/L — ABNORMAL LOW (ref 135–145)
Total Bilirubin: 1.9 mg/dL — ABNORMAL HIGH (ref 0.3–1.2)
Total Protein: 6.5 g/dL (ref 6.5–8.1)

## 2016-08-03 LAB — PROTIME-INR
INR: 1.46
PROTHROMBIN TIME: 17.9 s — AB (ref 11.4–15.2)

## 2016-08-03 LAB — BRAIN NATRIURETIC PEPTIDE: B Natriuretic Peptide: 216 pg/mL — ABNORMAL HIGH (ref 0.0–100.0)

## 2016-08-03 LAB — TROPONIN I

## 2016-08-03 LAB — APTT: aPTT: 32 seconds (ref 24–36)

## 2016-08-03 NOTE — ED Triage Notes (Signed)
Ascites has gotten worse since Sunday.  Pt having some sob and fells like his heart has been fluttering
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
## 2016-08-03 NOTE — H&P (Signed)
History and Physical    TAKEEM [name] HBZ:[id number] DOB: 1960-06-04 DOA: 08/03/2016  PCP: [name] Rossetti, MD  Patient coming from: Home.    Chief Complaint: orthopnea.   HPI: [name] is an 56 y.o. male with hx of prior alcohol abuse, liver cirrhosis, hx of right pleural effusion, chronic pain syndrome, HTN, HLD, anxiety and depression, recent large volume thoracocentesis, with suspicion that he would require another tap, outpatient, presented to the ER with increase SOB and orthopnea.  His CXR showed increased size of the right pleural effusion.  He has no fever, chills, or leukocytosis.  He doesn't appears ill or having hypoxia or respiratory distress.  Hospitalist was asked to admit him for further evaluation and Tx, as he likely will need another thoracocentesis.  It was felt that it was due to either hepatohydrothorax, or CHF. It was also noted that he would benefit getting simultaneous serum and pleural effusion LDH.    ED Course:  See above.  Rewiew of Systems:  Constitutional: Negative for malaise, fever and chills. No significant weight loss or weight gain Eyes: Negative for eye pain, redness and discharge, diplopia, visual changes, or flashes of light. ENMT: Negative for ear pain, hoarseness, nasal congestion, sinus pressure and sore throat. No headaches; tinnitus, drooling, or problem swallowing. Cardiovascular: Negative for chest pain, palpitations, diaphoresis, dyspnea and peripheral edema. ; No orthopnea, PND Respiratory: Negative for cough, hemoptysis, wheezing and stridor. No pleuritic chestpain. Gastrointestinal: Negative for diarrhea, constipation,  melena, blood in stool, hematemesis, jaundice and rectal bleeding.    Genitourinary: Negative for frequency, dysuria, incontinence,flank pain and hematuria; Musculoskeletal: Negative for back pain and neck pain. Negative for swelling and trauma.;  Skin: . Negative for pruritus, rash, abrasions, bruising and skin  lesion.; ulcerations Neuro: Negative for headache, lightheadedness and neck stiffness. Negative for weakness, altered level of consciousness , altered mental status, extremity weakness, burning feet, involuntary movement, seizure and syncope.  Psych: negative for anxiety, depression, insomnia, tearfulness, panic attacks, hallucinations, paranoia, suicidal or homicidal ideation    Past Medical History:  Diagnosis Date  . Anxiety   . Chronic back pain   . DDD (degenerative disc disease), lumbosacral   . Depression   . Elevated LFTs    secondary to ETOH  . History of alcohol abuse   . Hx of cardiac cath 1996  . Hyperlipidemia   . Hypertension   . Stroke [facility]) 2003    Past Surgical History:  Procedure Laterality Date  . CARDIAC CATHETERIZATION  1996  . COLONOSCOPY N/A 03/25/2015   Procedure: COLONOSCOPY;  Surgeon: Rogene Houston, MD;  Location: [facility];  Service: Endoscopy;  Laterality: N/A;  1130  . ESOPHAGOGASTRODUODENOSCOPY N/A 03/25/2015   Procedure: ESOPHAGOGASTRODUODENOSCOPY (EGD);  Surgeon: Rogene Houston, MD;  Location: [facility];  Service: Endoscopy;  Laterality: N/A;     reports that he has never smoked. He quit smokeless tobacco use about 2 years ago. His smokeless tobacco use included Chew. He reports that he does not drink alcohol or use drugs.  Allergies  Allergen Reactions  . Albumin (Human) Hives and Itching  . Bee Venom Anaphylaxis and Nausea Only  . Bee Venom Anaphylaxis, Nausea And Vomiting and Swelling    Throat swelling  . Other Hives    Pt states there was a fluid through IV he was getting while getting fluid taken off his stomach. It gave him hives    Family History  Problem Relation Age of Onset  .  Heart disease Mother   . Cancer Father        throat cancer  . Cancer Brother        melanoma  . Thyroid disease Sister      Prior to Admission medications   Medication Sig Start Date End Date Taking? Authorizing Provider  busPIRone (BUSPAR)  7.5 MG tablet Take 1 tablet (7.5 mg total) by mouth 2 (two) times daily. FOR ANXIETY 07/06/16  Yes [place], Modena Nunnery, MD  CARAFATE 1 GM/10ML suspension TAKE TWO TEASPOONSFUL (10 ML) BY MOUTH FOUR TIMES DAILY - WITH MEALS AND AT BEDTIME 07/27/16  Yes [place], Modena Nunnery, MD  carvedilol (COREG) 6.25 MG tablet Take 1 tablet (6.25 mg total) by mouth 2 (two) times daily. 07/06/16  Yes [place], Modena Nunnery, MD  EPINEPHrine 0.3 mg/0.3 mL IJ SOAJ injection Inject 0.3 mLs (0.3 mg total) into the muscle once. 06/11/15  Yes [place], Modena Nunnery, MD  FLUoxetine (PROZAC) 20 MG tablet Take 1 tablet (20 mg total) by mouth daily. 07/06/16  Yes [place], Modena Nunnery, MD  furosemide (LASIX) 40 MG tablet Take 40 mg (1 tablet) daily 07/20/16  Yes Tat, Shanon Brow, MD  lactulose [facility]) 10 GM/15ML solution TAKE 22.5ML BY MOUTH TWICE DAILY 06/29/16  Yes , Modena Nunnery, MD  lisinopril (PRINIVIL,ZESTRIL) 2.5 MG tablet Take 1 tablet (2.5 mg total) by mouth daily. 07/20/16 10/18/16 Yes Tat, Shanon Brow, MD  omeprazole (PRILOSEC) 20 MG capsule Take 1 capsule (20 mg total) by mouth 2 (two) times daily. 07/06/16  Yes , Modena Nunnery, MD  spironolactone (ALDACTONE) 25 MG tablet Take 1 tablet (25 mg total) by mouth daily. 07/06/16  Yes , Modena Nunnery, MD  thiamine (VITAMIN B-1) 50 MG tablet Take by mouth daily.   Yes [provider]    Physical Exam: Vitals:   08/03/16 2047 08/03/16 2130 08/03/16 2230 08/03/16 2300  BP:  133/85 134/85 133/64  Pulse:   60 66  Resp:  (!) 29 (!) 24   SpO2:  97% 95% 94%  Weight: 92.5 kg (204 lb)     Height: 6\' 3"  (1.905 m)         Constitutional: NAD, calm, comfortable Vitals:   08/03/16 2047 08/03/16 2130 08/03/16 2230 08/03/16 2300  BP:  133/85 134/85 133/64  Pulse:   60 66  Resp:  (!) 29 (!) 24   SpO2:  97% 95% 94%  Weight: 92.5 kg (204 lb)     Height: 6\' 3"  (1.905 m)      Eyes: PERRL, lids and conjunctivae normal ENMT: Mucous membranes are moist. Posterior pharynx clear of any exudate or  lesions.Normal dentition.  Neck: normal, supple, no masses, no thyromegaly Respiratory: clear to auscultation bilaterally, but definitely decrease BS on the right.  no wheezing, no crackles. Normal respiratory effort. No accessory muscle use.  Cardiovascular: Regular rate and rhythm, no murmurs / rubs / gallops. No extremity edema. 2+ pedal pulses. No carotid bruits.  Abdomen: no tenderness, no masses palpated. No hepatosplenomegaly. Bowel sounds positive.  Musculoskeletal: no clubbing / cyanosis. No joint deformity upper and lower extremities. Good ROM, no contractures. Normal muscle tone.  Skin: no rashes, lesions, ulcers. No induration Neurologic: CN 2-12 grossly intact. Sensation intact, DTR normal. Strength 5/5 in all 4.  Psychiatric: Normal judgment and insight. Alert and oriented x 3. Normal mood.    Labs on Admission: I have personally reviewed following labs and imaging studies  CBC:  Recent Labs Lab 08/03/16 2131  WBC 5.8  NEUTROABS 3.0  HGB 13.6  HCT 39.4  MCV 103.4*  PLT 88*   Basic Metabolic Panel:  Recent Labs Lab 08/03/16 2131  NA 131*  K 3.4*  CL 96*  CO2 26  GLUCOSE 113*  BUN 9  CREATININE 0.89  CALCIUM 8.1*   GFR: Estimated Creatinine Clearance: 109.4 mL/min (by C-G formula based on SCr of 0.89 mg/dL). Liver Function Tests:  Recent Labs Lab 08/03/16 2131  AST 54*  ALT 23  ALKPHOS 134*  BILITOT 1.9*  PROT 6.5  ALBUMIN 2.2*   Coagulation Profile:  Recent Labs Lab 08/03/16 2131  INR 1.46   Cardiac Enzymes:  Recent Labs Lab 08/03/16 2131  TROPONINI <0.03   Urine analysis:    Component Value Date/Time   COLORURINE AMBER (A) 12/14/2015 1439   APPEARANCEUR CLEAR 12/14/2015 1439   LABSPEC 1.015 12/14/2015 1439   PHURINE 6.0 12/14/2015 1439   GLUCOSEU TRACE (A) 12/14/2015 1439   HGBUR 3+ (A) 12/14/2015 1439   BILIRUBINUR NEGATIVE 12/14/2015 1439   KETONESUR TRACE (A) 12/14/2015 1439   PROTEINUR 1+ (A) 12/14/2015 1439    UROBILINOGEN 1.0 07/26/2014 2231   NITRITE NEGATIVE 12/14/2015 1439   LEUKOCYTESUR NEGATIVE 12/14/2015 1439    Radiological Exams on Admission: Dg Chest 2 View  Result Date: 08/03/2016 CLINICAL DATA:  Shortness of breath EXAM: CHEST  2 VIEW COMPARISON:  Chest radiograph 07/20/2016 FINDINGS: Large right pleural effusion has increased in size. There is associated atelectasis. Left lung is clear. IMPRESSION: Increased size of large right pleural effusion. Electronically Signed   By: Ulyses Jarred M.D.   On: 08/03/2016 22:03    EKG: Independently reviewed.   Assessment/Plan Principal Problem:   Pleural effusion Active Problems:   Anxiety and depression   Hyperlipidemia   Cirrhosis of liver with ascites (HCC)   A-fib (HCC)   Hypertension   History of alcohol abuse   PLAN:   Large right pleural effusion:  This is very unlikely to be CHF, presented as a unilateral pleural effusion of this size.  Will obtain LDH in the morning, along with requesting IR right pleural tap.  Please send pleural fluid for cytology and LDH.    Afib:  Rate is controlled.  He is not on anticoagulation.  HTN;  Controlled.     HLD:  Stable.  Continue with meds.    DVT prophylaxis: sub Q heparin.  Code Status: FULL CODE.  Family Communication: None at bedside.  Disposition Plan: home.  Consults called: None.  Admission status: OBS>    Jhamir Pickup MD FACP. [facility]  If 7PM-7AM, please contact night-coverage [email or web address] Password [email or web address]  08/03/2016, 11:11 PM

## 2016-08-03 NOTE — ED Notes (Signed)
ekg given to Dr Lita Mains

## 2016-08-04 ENCOUNTER — Observation Stay (HOSPITAL_COMMUNITY): Payer: Medicare Other

## 2016-08-04 ENCOUNTER — Telehealth (INDEPENDENT_AMBULATORY_CARE_PROVIDER_SITE_OTHER): Payer: Self-pay | Admitting: *Deleted

## 2016-08-04 DIAGNOSIS — J90 Pleural effusion, not elsewhere classified: Secondary | ICD-10-CM | POA: Diagnosis not present

## 2016-08-04 DIAGNOSIS — E785 Hyperlipidemia, unspecified: Secondary | ICD-10-CM | POA: Diagnosis present

## 2016-08-04 DIAGNOSIS — F329 Major depressive disorder, single episode, unspecified: Secondary | ICD-10-CM | POA: Diagnosis present

## 2016-08-04 DIAGNOSIS — Z888 Allergy status to other drugs, medicaments and biological substances status: Secondary | ICD-10-CM | POA: Diagnosis not present

## 2016-08-04 DIAGNOSIS — G894 Chronic pain syndrome: Secondary | ICD-10-CM | POA: Diagnosis present

## 2016-08-04 DIAGNOSIS — M5137 Other intervertebral disc degeneration, lumbosacral region: Secondary | ICD-10-CM | POA: Diagnosis present

## 2016-08-04 DIAGNOSIS — Z79899 Other long term (current) drug therapy: Secondary | ICD-10-CM | POA: Diagnosis not present

## 2016-08-04 DIAGNOSIS — I5043 Acute on chronic combined systolic (congestive) and diastolic (congestive) heart failure: Secondary | ICD-10-CM | POA: Diagnosis present

## 2016-08-04 DIAGNOSIS — Z91030 Bee allergy status: Secondary | ICD-10-CM | POA: Diagnosis not present

## 2016-08-04 DIAGNOSIS — Z8673 Personal history of transient ischemic attack (TIA), and cerebral infarction without residual deficits: Secondary | ICD-10-CM | POA: Diagnosis not present

## 2016-08-04 DIAGNOSIS — I420 Dilated cardiomyopathy: Secondary | ICD-10-CM | POA: Diagnosis present

## 2016-08-04 DIAGNOSIS — F419 Anxiety disorder, unspecified: Secondary | ICD-10-CM | POA: Diagnosis present

## 2016-08-04 DIAGNOSIS — F1011 Alcohol abuse, in remission: Secondary | ICD-10-CM | POA: Diagnosis present

## 2016-08-04 DIAGNOSIS — K7031 Alcoholic cirrhosis of liver with ascites: Secondary | ICD-10-CM | POA: Diagnosis present

## 2016-08-04 DIAGNOSIS — Z9109 Other allergy status, other than to drugs and biological substances: Secondary | ICD-10-CM | POA: Diagnosis not present

## 2016-08-04 DIAGNOSIS — I4891 Unspecified atrial fibrillation: Secondary | ICD-10-CM | POA: Diagnosis present

## 2016-08-04 DIAGNOSIS — I110 Hypertensive heart disease with heart failure: Secondary | ICD-10-CM | POA: Diagnosis present

## 2016-08-04 LAB — BODY FLUID CELL COUNT WITH DIFFERENTIAL
Eos, Fluid: 0 %
Lymphs, Fluid: 55 %
Monocyte-Macrophage-Serous Fluid: 43 % — ABNORMAL LOW (ref 50–90)
Neutrophil Count, Fluid: 2 % (ref 0–25)
Total Nucleated Cell Count, Fluid: 272 cu mm (ref 0–1000)

## 2016-08-04 LAB — COMPREHENSIVE METABOLIC PANEL
ALT: 20 U/L (ref 17–63)
ANION GAP: 7 (ref 5–15)
AST: 41 U/L (ref 15–41)
Albumin: 2 g/dL — ABNORMAL LOW (ref 3.5–5.0)
Alkaline Phosphatase: 109 U/L (ref 38–126)
BILIRUBIN TOTAL: 1.9 mg/dL — AB (ref 0.3–1.2)
BUN: 9 mg/dL (ref 6–20)
CALCIUM: 7.9 mg/dL — AB (ref 8.9–10.3)
CO2: 28 mmol/L (ref 22–32)
Chloride: 98 mmol/L — ABNORMAL LOW (ref 101–111)
Creatinine, Ser: 0.9 mg/dL (ref 0.61–1.24)
GFR calc non Af Amer: >60 mL/min (ref >60)
Glucose, Bld: 106 mg/dL — ABNORMAL HIGH (ref 65–99)
POTASSIUM: 4 mmol/L (ref 3.5–5.1)
SODIUM: 133 mmol/L — AB (ref 135–145)
TOTAL PROTEIN: 5.6 g/dL — AB (ref 6.5–8.1)

## 2016-08-04 LAB — CBC
HCT: 38.7 % — ABNORMAL LOW (ref 39.0–52.0)
HEMOGLOBIN: 13.2 g/dL (ref 13.0–17.0)
MCH: 35.2 pg — ABNORMAL HIGH (ref 26.0–34.0)
MCHC: 34.1 g/dL (ref 30.0–36.0)
MCV: 103.2 fL — ABNORMAL HIGH (ref 78.0–100.0)
Platelets: 75 10*3/uL — ABNORMAL LOW (ref 150–400)
RBC: 3.75 MIL/uL — AB (ref 4.22–5.81)
RDW: 14.9 % (ref 11.5–15.5)
WBC: 5.2 10*3/uL (ref 4.0–10.5)

## 2016-08-04 LAB — GRAM STAIN

## 2016-08-04 LAB — LACTATE DEHYDROGENASE: LDH: 146 U/L (ref 98–192)

## 2016-08-04 LAB — MRSA PCR SCREENING: MRSA by PCR: NEGATIVE

## 2016-08-04 LAB — LACTATE DEHYDROGENASE, PLEURAL OR PERITONEAL FLUID: LD FL: 55 U/L — AB (ref 3–23)

## 2016-08-04 MED ORDER — FUROSEMIDE 40 MG PO TABS
40.0000 mg | ORAL_TABLET | Freq: Two times a day (BID) | ORAL | Status: DC
  Administered 2016-08-04 – 2016-08-05 (×2): 40 mg via ORAL
  Filled 2016-08-04 (×2): qty 1

## 2016-08-04 MED ORDER — SUCRALFATE 1 GM/10ML PO SUSP
1.0000 g | Freq: Three times a day (TID) | ORAL | Status: DC
  Administered 2016-08-04 – 2016-08-05 (×6): 1 g via ORAL
  Filled 2016-08-04 (×6): qty 10

## 2016-08-04 MED ORDER — SPIRONOLACTONE 25 MG PO TABS
25.0000 mg | ORAL_TABLET | Freq: Every day | ORAL | Status: DC
  Administered 2016-08-04: 25 mg via ORAL
  Filled 2016-08-04: qty 1

## 2016-08-04 MED ORDER — LACTULOSE 10 GM/15ML PO SOLN
10.0000 g | Freq: Every day | ORAL | Status: DC
  Administered 2016-08-04 – 2016-08-05 (×2): 10 g via ORAL
  Filled 2016-08-04 (×2): qty 30

## 2016-08-04 MED ORDER — FUROSEMIDE 40 MG PO TABS
40.0000 mg | ORAL_TABLET | Freq: Every day | ORAL | Status: DC
  Administered 2016-08-04: 40 mg via ORAL
  Filled 2016-08-04: qty 1

## 2016-08-04 MED ORDER — PANTOPRAZOLE SODIUM 40 MG PO TBEC
40.0000 mg | DELAYED_RELEASE_TABLET | Freq: Every day | ORAL | Status: DC
  Administered 2016-08-04 – 2016-08-05 (×2): 40 mg via ORAL
  Filled 2016-08-04 (×2): qty 1

## 2016-08-04 MED ORDER — BUSPIRONE HCL 5 MG PO TABS
7.5000 mg | ORAL_TABLET | Freq: Two times a day (BID) | ORAL | Status: DC
  Administered 2016-08-04 – 2016-08-05 (×4): 7.5 mg via ORAL
  Filled 2016-08-04 (×4): qty 2

## 2016-08-04 MED ORDER — HEPARIN SODIUM (PORCINE) 5000 UNIT/ML IJ SOLN
5000.0000 [IU] | Freq: Three times a day (TID) | INTRAMUSCULAR | Status: DC
  Administered 2016-08-04 – 2016-08-05 (×6): 5000 [IU] via SUBCUTANEOUS
  Filled 2016-08-04 (×5): qty 1

## 2016-08-04 MED ORDER — SPIRONOLACTONE 25 MG PO TABS
50.0000 mg | ORAL_TABLET | Freq: Every day | ORAL | Status: DC
  Administered 2016-08-05: 50 mg via ORAL
  Filled 2016-08-04: qty 2

## 2016-08-04 MED ORDER — CARVEDILOL 3.125 MG PO TABS
6.2500 mg | ORAL_TABLET | Freq: Two times a day (BID) | ORAL | Status: DC
  Administered 2016-08-04 – 2016-08-05 (×4): 6.25 mg via ORAL
  Filled 2016-08-04 (×4): qty 2

## 2016-08-04 MED ORDER — FLUOXETINE HCL 20 MG PO CAPS
20.0000 mg | ORAL_CAPSULE | Freq: Every day | ORAL | Status: DC
  Administered 2016-08-04 – 2016-08-05 (×2): 20 mg via ORAL
  Filled 2016-08-04 (×4): qty 1

## 2016-08-04 MED ORDER — LISINOPRIL 5 MG PO TABS
2.5000 mg | ORAL_TABLET | Freq: Every day | ORAL | Status: DC
  Administered 2016-08-04 – 2016-08-05 (×2): 2.5 mg via ORAL
  Filled 2016-08-04 (×2): qty 1

## 2016-08-04 MED ORDER — VITAMIN B-1 100 MG PO TABS
50.0000 mg | ORAL_TABLET | Freq: Every day | ORAL | Status: DC
  Administered 2016-08-04 – 2016-08-05 (×2): 50 mg via ORAL
  Filled 2016-08-04 (×2): qty 1

## 2016-08-04 NOTE — Telephone Encounter (Signed)
Per Dr.Rehman the patient is to have a Thoracentesis next Thursday , June 28. Please arrange.

## 2016-08-04 NOTE — Progress Notes (Signed)
Thoracentesis complete no signs of distress 2.5L yellow colored pleural fluid removed.

## 2016-08-04 NOTE — Telephone Encounter (Signed)
Patient's mother Opal Sidles called, she thought he had an appointment with [place] today (it was in x-ray, not [place]) and she wanted to make [place] aware that he was in ICU Room 9.  [phone]

## 2016-08-04 NOTE — Progress Notes (Addendum)
PROGRESS NOTE    [name]  PNT:[id number] DOB: Nov 16, 1960 DOA: 08/03/2016 PCP: [name], MD   Brief Narrative:  56 year old male with history of alcohol cirrhosis, right-sided pleural effusion, chronic pain, hypertension, hyperlipidemia came to the ED with complaints of shortness of breath and orthopnea. On chest x-ray he was found to have right-sided pleural effusion.   Assessment & Plan:   Principal Problem:   Pleural effusion Active Problems:   Anxiety and depression   Hyperlipidemia   Cirrhosis of liver with ascites (HCC)   A-fib (HCC)   Hypertension   History of alcohol abuse   Pleural effusion, right  Acute shortness of breath/large right-sided pleural effusion -IR to perform thoracentesis this morning. Will send pleural fluid for cytology, LDH, cell count with diff and Gram stain -Supplemental oxygen as needed and wean off -Increase Lasix dose to twice daily and Aldactone to 50 mg -2 g sodium diet -Closely monitor renal function. Needs to closely follow-up outpatient with gastroenterology -Echocardiogram done in April 2018 shows ejection fraction 40-45 percent. Severely dilated cardiomyopathy. Will hold off on repeating echocardiogram  Decompensated alcoholic liver cirrhosis -Requiring thoracentesis and paracentesis in the past -Increased his diuretics. He needs to be on fluid restriction and 2 g sodium diet -Needs to follow-up outpatient closely with gastroenterology. No history of hepatic encephalopathy according to the patient. Still have room to increase diuretics if needed but may end up needing TIPS? Defer to gastroenterology. Not sure if he's been evaluated for liver transplant. 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 Will need to closely follow up with Dr Harl Bowie  outpatient.   History of atrial fibrillation -Currently rate controlled and not on any anticoagulation  Hypertension -Continue Coreg, lisinopril -Already on Lasix and Aldactone  Hyperlipidemia -Statin?    DVT prophylaxis:  Heparin subcutaneous Code Status:  Full Family Communication:  None at bedside Disposition Plan:  Change to inpatient  It is my clinical opinion that admission to INPATIENT is reasonable and necessary in this 56 y.o. male . presenting with symptoms of sob, concerning for recurrently pleural effusion . in the context of PMH including: Alcohol cirrhosis  . with pertinent positives on physical exam including: abd ascites and diminished BS on the right  . and pertinent positives on radiographic and laboratory data including: large right sided pleural effusion  . Workup and treatment include thoracentesis and further med adjustment. High risk for readmission as I suspect his fluid will reaccumulate    Given the aforementioned, the predictability of an adverse outcome is felt to be significant. I expect that the patient will require at least 2 midnights in the hospital to treat this condition.    Consultants:   None  Procedures:   US thoracentesis 6/21  Antimicrobials:   None   Subjective: States he feels sob with movement this morning. No other complaints including fevers, chills.  Has hx of recent thoracentesis and paracentesis.   Objective: Vitals:   08/04/16 0030 08/04/16 0100 08/04/16 1103 08/04/16 1129  BP:   (!) 97/52 (!) 93/57  Pulse:  (!) 114 94 86  Resp:  (!) 24 20 20   Temp:   97.5 F (36.4 C)   TempSrc:   Oral   SpO2:  94% 98% 98%  Weight: 92.3 kg (203 lb 7.8 oz)  Height: 6\' 3"  (1.905 m)       Intake/Output Summary (Last 24 hours) at 08/04/16 1151 Last data filed at 08/04/16 0730  Gross per 24 hour  Intake                0 ml  Output              200 ml  Net             -200 ml   Filed Weights   08/03/16 2047 08/04/16  0030  Weight: 92.5 kg (204 lb) 92.3 kg (203 lb 7.8 oz)    Examination:  General exam: Appears calm and comfortable  Respiratory system: diffuse diminished BS especially on the right.  Cardiovascular system: S1 & S2 heard, RRR. No JVD, murmurs, rubs, gallops or clicks. No pedal edema. Gastrointestinal system: Abdomen is distended, soft and nontender. No organomegaly or masses felt. Normal bowel sounds heard. + fluid wave shift.  Central nervous system: Alert and oriented. No focal neurological deficits. Extremities: Symmetric 5 x 5 power. Skin: No rashes, lesions or ulcers Psychiatry: Judgement and insight appear normal. Mood & affect appropriate.     Data Reviewed:   CBC:  Recent Labs Lab 08/03/16 2131 08/04/16 0549  WBC 5.8 5.2  NEUTROABS 3.0  --   HGB 13.6 13.2  HCT 39.4 38.7*  MCV 103.4* 103.2*  PLT 88* 75*   Basic Metabolic Panel:  Recent Labs Lab 08/03/16 2131 08/04/16 0549  NA 131* 133*  K 3.4* 4.0  CL 96* 98*  CO2 26 28  GLUCOSE 113* 106*  BUN 9 9  CREATININE 0.89 0.90  CALCIUM 8.1* 7.9*   GFR: Estimated Creatinine Clearance: 108.2 mL/min (by C-G formula based on SCr of 0.9 mg/dL). Liver Function Tests:  Recent Labs Lab 08/03/16 2131 08/04/16 0549  AST 54* 41  ALT 23 20  ALKPHOS 134* 109  BILITOT 1.9* 1.9*  PROT 6.5 5.6*  ALBUMIN 2.2* 2.0*   No results for input(s): LIPASE, AMYLASE in the last 168 hours. No results for input(s): AMMONIA in the last 168 hours. Coagulation Profile:  Recent Labs Lab 08/03/16 2131  INR 1.46   Cardiac Enzymes:  Recent Labs Lab 08/03/16 2131  TROPONINI <0.03   BNP (last 3 results) No results for input(s): PROBNP in the last 8760 hours. HbA1C: No results for input(s): HGBA1C in the last 72 hours. CBG: No results for input(s): GLUCAP in the last 168 hours. Lipid Profile: No results for input(s): CHOL, HDL, LDLCALC, TRIG, CHOLHDL, LDLDIRECT in the last 72 hours. Thyroid Function Tests: No results  for input(s): TSH, T4TOTAL, FREET4, T3FREE, THYROIDAB in the last 72 hours. Anemia Panel: No results for input(s): VITAMINB12, FOLATE, FERRITIN, TIBC, IRON, RETICCTPCT in the last 72 hours. Sepsis Labs: No results for input(s): PROCALCITON, LATICACIDVEN in the last 168 hours.  Recent Results (from the past 240 hour(s))  MRSA PCR Screening     Status: None   Collection Time: 08/04/16 12:25 AM  Result Value Ref Range Status   MRSA by PCR NEGATIVE NEGATIVE Final    Comment:        The GeneXpert MRSA Assay (FDA approved for NASAL specimens only), is one component of a comprehensive MRSA colonization surveillance program. It is not intended to diagnose MRSA infection nor to guide or monitor treatment for MRSA infections.          Radiology Studies: Dg Chest 2 View  Result Date: 08/03/2016 CLINICAL DATA:  Shortness of breath  EXAM: CHEST  2 VIEW COMPARISON:  Chest radiograph 07/20/2016 FINDINGS: Large right pleural effusion has increased in size. There is associated atelectasis. Left lung is clear. IMPRESSION: Increased size of large right pleural effusion. Electronically Signed   By: Ulyses Jarred M.D.   On: 08/03/2016 22:03   US Thoracentesis Asp Pleural Space W/img Guide  Result Date: 08/04/2016 INDICATION: Symptomatic right sided pleural effusion EXAM: US THORACENTESIS ASP PLEURAL SPACE W/IMG GUIDE COMPARISON:  Previous thoracentesis. MEDICATIONS: 10 cc 1% lidocaine. COMPLICATIONS: None immediate. TECHNIQUE: Informed written consent was obtained from the patient after a discussion of the risks, benefits and alternatives to treatment. A timeout was performed prior to the initiation of the procedure. Initial ultrasound scanning demonstrates a right pleural effusion. The lower chest was prepped and draped in the usual sterile fashion. 1% lidocaine was used for local anesthesia. Under direct ultrasound guidance, a 19 gauge, 7-cm, Yueh catheter was introduced. An ultrasound image was saved  for documentation purposes. The thoracentesis was performed. The catheter was removed and a dressing was applied. The patient tolerated the procedure well without immediate post procedural complication. The patient was escorted to have an upright chest radiograph. FINDINGS: A total of approximately 2.5 liters of cloudy yellow fluid was removed. Requested samples were sent to the laboratory. IMPRESSION: Successful ultrasound-guided right sided thoracentesis yielding 2.5 liters of pleural fluid. Read by Lavonia Drafts [facility] Electronically Signed   By: [place]   On: 08/04/2016 11:42        Scheduled Meds: . busPIRone  7.5 mg Oral BID  . carvedilol  6.25 mg Oral BID  . FLUoxetine  20 mg Oral Daily  . furosemide  40 mg Oral Daily  . heparin  5,000 Units Subcutaneous Q8H  . lactulose  10 g Oral Daily  . lisinopril  2.5 mg Oral Daily  . pantoprazole  40 mg Oral Daily  . spironolactone  25 mg Oral Daily  . sucralfate  1 g Oral TID WC & HS  . thiamine  50 mg Oral Daily   Continuous Infusions:   LOS: 0 days    Time spent: 35 mins     Ankit Arsenio Loader, MD [facility] Pager [phone]   If 7PM-7AM, please contact night-coverage [email or web address] Password [facility] 08/04/2016, 11:51 AM

## 2016-08-04 NOTE — Procedures (Signed)
   US guided Rt thoracentesis  2.5 L cloudy yellow fluid obtained Pt tolerated well  Sent for labs per MD  CXR pending

## 2016-08-05 LAB — COMPREHENSIVE METABOLIC PANEL
ALT: 19 U/L (ref 17–63)
ANION GAP: 6 (ref 5–15)
AST: 39 U/L (ref 15–41)
Albumin: 1.9 g/dL — ABNORMAL LOW (ref 3.5–5.0)
Alkaline Phosphatase: 116 U/L (ref 38–126)
BILIRUBIN TOTAL: 1.7 mg/dL — AB (ref 0.3–1.2)
BUN: 12 mg/dL (ref 6–20)
CALCIUM: 7.9 mg/dL — AB (ref 8.9–10.3)
CO2: 29 mmol/L (ref 22–32)
Chloride: 100 mmol/L — ABNORMAL LOW (ref 101–111)
Creatinine, Ser: 1.11 mg/dL (ref 0.61–1.24)
Glucose, Bld: 147 mg/dL — ABNORMAL HIGH (ref 65–99)
Potassium: 4 mmol/L (ref 3.5–5.1)
Sodium: 135 mmol/L (ref 135–145)
TOTAL PROTEIN: 5.6 g/dL — AB (ref 6.5–8.1)

## 2016-08-05 LAB — CBC
HCT: 38.8 % — ABNORMAL LOW (ref 39.0–52.0)
HEMOGLOBIN: 13.5 g/dL (ref 13.0–17.0)
MCH: 35.9 pg — ABNORMAL HIGH (ref 26.0–34.0)
MCHC: 34.8 g/dL (ref 30.0–36.0)
MCV: 103.2 fL — ABNORMAL HIGH (ref 78.0–100.0)
Platelets: 77 10*3/uL — ABNORMAL LOW (ref 150–400)
RBC: 3.76 MIL/uL — AB (ref 4.22–5.81)
RDW: 14.7 % (ref 11.5–15.5)
WBC: 4.1 10*3/uL (ref 4.0–10.5)

## 2016-08-05 MED ORDER — SPIRONOLACTONE 50 MG PO TABS: 50.0000 mg | ORAL_TABLET | Freq: Every day | ORAL | 0 refills | Status: DC

## 2016-08-05 MED ORDER — FUROSEMIDE 40 MG PO TABS: 40.0000 mg | ORAL_TABLET | Freq: Two times a day (BID) | ORAL | 0 refills | Status: DC

## 2016-08-05 MED ORDER — GUAIFENESIN-DM 100-10 MG/5ML PO SYRP
5.0000 mL | ORAL_SOLUTION | ORAL | Status: DC | PRN
  Administered 2016-08-05: 5 mL via ORAL
  Filled 2016-08-05: qty 5

## 2016-08-05 NOTE — Discharge Summary (Signed)
Physician Discharge Summary  [name] SWH:[id number] DOB: [birth year] DOA: 08/03/2016  PCP: [name], MD  Admit date: 08/03/2016 Discharge date: 08/05/2016  Admitted From: Home Disposition: Home  Recommendations for Outpatient Follow-up:  1. Follow up with PCP in 2 weeks 2. Follow up with Dr [name] in 1 week  3. Follow up with Dr Harl Bowie (Cardiology) in one week.  4. Lasix increased to 40mg  twice daily  5. Aldactone increased to 50mg  orally daily  6. Lab check BMP and Mg on 08/08/16  Home Health: None Equipment/Devices: None Discharge Condition: Stable CODE STATUS: Full Diet recommendation: 2 g sodium diet  Brief/Interim Summary: 56 year old male with past medical history of alcohol cirrhosis, right-sided pleural effusion status post thoracentesis, chronic pain, hypertension, hyperlipidemia came to the ER with the complaints of shortness of breath and orthopnea. Chest x-ray showed he had large right-sided pleural effusion. He underwent right-sided thoracentesis were little over 2 L was removed. There is no signs of infections noted on fluid studies. His dose of Lasix was increased from 40 mg daily to twice daily and Aldactone was increased from 25 mg to 50 mg daily. After thoracentesis he felt significantly better and did not have any further complaints. I also spoke with Karna Christmas, NP on 08/04/2016 from Dr. Olevia Perches office who stated they will follow-up patient outpatient. His last echocardiogram noted in April 2018 which showed ejection fraction of 40-45 percent with severe dilated cardiomyopathy. Patient states he has a follow-up appointment with Dr. branch in 1 week. Today he has no complaints and states he feels significantly better and is ready to go home. I have explained to remain compliant with his diet and medications. Is deemed medically stable and has reached maximum benefit from in hospital stay and is ready to be discharged with above recommendations. He is also  advised to return to the ER if it becomes necessary  Discharge Diagnoses:  Principal Problem:   Pleural effusion Active Problems:   Anxiety and depression   Hyperlipidemia   Cirrhosis of liver with ascites (HCC)   A-fib (HCC)   Hypertension   History of alcohol abuse   Pleural effusion, right   Acute shortness of breath with large right-sided pleural effusion, improved -Status post thoracentesis on 08/04/2016. Over 2 L of fluid removed. 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 branch  History of atrial fibrillation -Currently rate controlled and not on any anticoagulation  Hypertension -Continue Coreg and lisinopril. Lasix and Aldactone increased therefore closely monitor blood pressure outpatient. I've advised patient to routinely check his blood pressure at home at least 2 or 3 times per day  Hyperlipidemia    Discharge Instructions   Allergies as of 08/05/2016      Reactions   Albumin (human) Hives, Itching   Bee Venom Anaphylaxis, Nausea Only   Bee Venom Anaphylaxis, Nausea And Vomiting, Swelling   Throat swelling   Other Hives   Pt states there was a fluid through IV he was getting while getting fluid taken off his stomach. It gave him hives      Medication List    TAKE these medications   busPIRone 7.5  MG tablet Commonly known as:  BUSPAR Take 1 tablet (7.5 mg total) by mouth 2 (two) times daily. FOR ANXIETY   CARAFATE 1 GM/10ML suspension Generic drug:  sucralfate TAKE TWO TEASPOONSFUL (10 ML) BY MOUTH FOUR TIMES DAILY -  WITH MEALS AND AT BEDTIME   carvedilol 6.25 MG tablet Commonly known as:  COREG Take 1 tablet (6.25 mg total) by mouth 2 (two) times daily.   EPINEPHrine 0.3 mg/0.3 mL Soaj injection Commonly known as:  EPI-PEN Inject 0.3 mLs (0.3 mg total) into the muscle once.   FLUoxetine 20 MG tablet Commonly known as:  PROZAC Take 1 tablet (20 mg total) by mouth daily.   furosemide 40 MG tablet Commonly known as:  LASIX Take 1 tablet (40 mg total) by mouth 2 (two) times daily. What changed:  how much to take  how to take this  when to take this  additional instructions   lactulose 10 GM/15ML solution Commonly known as:  CHRONULAC TAKE 22.5ML BY MOUTH TWICE DAILY   lisinopril 2.5 MG tablet Commonly known as:  PRINIVIL,ZESTRIL Take 1 tablet (2.5 mg total) by mouth daily.   omeprazole 20 MG capsule Commonly known as:  PRILOSEC Take 1 capsule (20 mg total) by mouth 2 (two) times daily.   spironolactone 50 MG tablet Commonly known as:  ALDACTONE Take 1 tablet (50 mg total) by mouth daily. Start taking on:  08/06/2016 What changed:  medication strength  how much to take   thiamine 50 MG tablet Commonly known as:  VITAMIN B-1 Take by mouth daily.       Allergies  Allergen Reactions  . Albumin (Human) Hives and Itching  . Bee Venom Anaphylaxis and Nausea Only  . Bee Venom Anaphylaxis, Nausea And Vomiting and Swelling    Throat swelling  . Other Hives    Pt states there was a fluid through IV he was getting while getting fluid taken off his stomach. It gave him hives    On your next visit with your primary care physician please Get Medicines reviewed and adjusted.   Please request your Prim.MD to go over all Hospital Tests and Procedure/Radiological results at the follow up, please get all Hospital records sent to your Prim MD by signing hospital release before you go home.   If you experience worsening of your admission symptoms, develop shortness of breath,  life threatening emergency, suicidal or homicidal thoughts you must seek medical attention immediately by calling 911 or calling your MD immediately  if symptoms less severe.  You Must read complete instructions/literature along with all the possible adverse reactions/side effects for all the Medicines you take and that have been prescribed to you. Take any new Medicines after you have completely understood and accpet all the possible adverse reactions/side effects.   Do not drive, operate heavy machinery, perform activities at heights, swimming or participation in water activities or provide baby sitting services if your were admitted for syncope or siezures until you have seen by Primary MD or a Neurologist and advised to do so again.  Do not drive when taking Pain medications.    Do not take more than prescribed Pain, Sleep and Anxiety Medications  Special Instructions: If you have smoked or chewed Tobacco  in the last 2 yrs please stop smoking, stop any regular Alcohol  and or any Recreational drug use.  Wear Seat belts while driving.   Please note  You were cared for by a hospitalist during your hospital stay. If you  have any questions about your discharge medications or the care you received while you were in the hospital after you are discharged, you can call the unit and asked to speak with the hospitalist on call if the hospitalist that took care of you is not available. Once you are discharged, your primary care physician will handle any further medical issues. Please note that NO REFILLS for any discharge medications will be authorized once you are discharged, as it is imperative that you return to your primary care physician (or establish a relationship with a primary care physician if you do not have one) for your aftercare needs so that they can reassess your need for medications and monitor your lab values.   Increase activity slowly        Consultations:  IR for  thoracentesis    Procedures/Studies: Dg Chest 1 View  Result Date: 08/04/2016 CLINICAL DATA:  Post thoracentesis on the right EXAM: CHEST 1 VIEW COMPARISON:  Chest x-ray of 08/03/2016 FINDINGS: There has been some reduction in volume of right pleural effusion after right thoracentesis. No pneumothorax is seen. The left lung remains clear. Heart size is stable. IMPRESSION: Some decrease in the volume of right pleural effusion after right thoracentesis. No pneumothorax. Electronically Signed   By: Ivar Drape M.D.   On: 08/04/2016 12:01   Dg Chest 1 View  Result Date: 07/20/2016 CLINICAL DATA:  Status post thoracentesis EXAM: CHEST 1 VIEW COMPARISON:  July 18, 2016 FINDINGS: There is no appreciable pneumothorax. There remains moderate pleural effusion with consolidation in the right mid and lower lung zone regions. Left lung is clear. Heart size and pulmonary vascularity are normal. No adenopathy. No bone lesions. IMPRESSION: Persistent moderate right pleural effusion with right mid lower lung zone consolidation. No new opacity. No pneumothorax. Stable cardiac silhouette. Electronically Signed   By: Lowella Grip III M.D.   On: 07/20/2016 09:21   Dg Chest 1 View  Result Date: 07/18/2016 CLINICAL DATA:  RIGHT pleural effusion post thoracentesis EXAM: CHEST 1 VIEW COMPARISON:  CT chest 07/18/2016 FINDINGS: Upper normal size of cardiac silhouette. Mediastinal contours and pulmonary vascularity normal. Moderate to large RIGHT pleural effusion remains despite removal of 2.1 L of fluid from the RIGHT hemithorax. Associated RIGHT basilar atelectasis. No pneumothorax. LEFT lung clear. IMPRESSION: Persistent moderate to large RIGHT pleural is fusion and basilar atelectasis despite pre seeding thoracentesis removing 2.1 L of fluid. No pneumothorax. Patient currently asymptomatic. Electronically Signed   By: Lavonia Dana M.D.   On: 07/18/2016 11:32   Dg Chest 2 View  Result Date: 08/03/2016 CLINICAL DATA:   Shortness of breath EXAM: CHEST  2 VIEW COMPARISON:  Chest radiograph 07/20/2016 FINDINGS: Large right pleural effusion has increased in size. There is associated atelectasis. Left lung is clear. IMPRESSION: Increased size of large right pleural effusion. Electronically Signed   By: Ulyses Jarred M.D.   On: 08/03/2016 22:03   Ct Chest W Contrast  Result Date: 07/18/2016 CLINICAL DATA:  56 year old hypertensive alcoholic male with shortness of breath. Subsequent encounter. EXAM: CT CHEST WITH CONTRAST TECHNIQUE: Multidetector CT imaging of the chest was performed during intravenous contrast administration. CONTRAST:  56mL ISOVUE-300 IOPAMIDOL (ISOVUE-300) INJECTION 61% COMPARISON:  07/18/2016 and 04/01/2016 chest x-ray. 07/14/2016 ultrasound. FINDINGS: Cardiovascular: Mild motion degradation. No central pulmonary embolus or aortic dissection. Coronary artery calcifications.  Trace pericardial fluid. Ascending thoracic aorta measures up to 3.5 cm. Trace calcification right subclavian artery. Trace calcification descending thoracic aorta. Mediastinum/Nodes: Tiny mediastinal/hilar lymph nodes without  adenopathy. Prominent varices surround the lower esophagus. Lungs/Pleura: Large right-sided pleural effusion with atelectasis of right lung without central obstructing lesion. 3.4 mm nodule right upper lobe adjacent to small sensory fissure (series 6, image 149). Minimal basilar atelectatic changes. Upper Abdomen: Cirrhotic liver. Prominent varices upper abdomen splenic/ esophageal and upper gastric region. Not able to confirm portal vein is patent and there are prominent vessels in this region which may represent cavernous transformation. Musculoskeletal: Degenerative changes cervical spine and thoracic spine. T12 remote anterior wedge compression deformities superior endplate Schmorl's node deformity. Mild gynecomastia. IMPRESSION: Large right-sided pleural effusion with atelectasis of right lung without central  obstructing lesion. Cirrhotic liver with prominent varices surrounding the lower esophagus, gastroesophageal junction, upper stomach and in the region of the splenic hilum. 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 Coronary artery calcifications. Scattered trace aortic/great vessel calcified plaque. 3.4 mm nodule left upper lobe (series 6, image 149). No follow-up needed if patient is low-risk. Non-contrast chest CT can be considered in 12 months if patient is high-risk. This recommendation follows the consensus statement: Guidelines for Management of Incidental Pulmonary Nodules Detected on CT Images: From the Fleischner Society 2017; Radiology 2017; 284:228-243. Electronically Signed   By: Genia Del M.D.   On: 07/18/2016 09:34   US Abdomen Limited  Result Date: 07/19/2016 CLINICAL DATA:  Alcoholic cirrhosis, ascites, paracentesis yesterday. EXAM: LIMITED ABDOMEN ULTRASOUND FOR ASCITES TECHNIQUE: Limited ultrasound survey for ascites was performed in all four abdominal quadrants. COMPARISON:  Right upper quadrant ultrasound of Jul 14, 2016. FINDINGS: There is ascites in the right lower quadrant with a small volume in the left lower quadrant. The volume of ascites has decreased since the paracentesis yesterday. Liver: Liver appears shrunken and its surface contour irregular where visualized. IMPRESSION: Cirrhotic changes within the liver. Small amount of ascites in the lower quadrants bilaterally. Electronically Signed   By: David  Martinique M.D.   On: 07/19/2016 10:18   US Paracentesis  Result Date: 07/14/2016 INDICATION: Alcoholic cirrhosis, ascites EXAM: ULTRASOUND GUIDED DIAGNOSTIC AND THERAPEUTIC PARACENTESIS MEDICATIONS: None. COMPLICATIONS: None immediate. PROCEDURE: Procedure, benefits, and risks of procedure were discussed with patient. Written informed consent for procedure was obtained.  Time out protocol followed. Adequate collection of ascites localized by ultrasound in RIGHT lower quadrant. Skin prepped and draped in usual sterile fashion. Skin and soft tissues anesthetized with 10 mL of 1% lidocaine. 5 [place] Yueh catheter placed into peritoneal cavity. 4.3 L of yellow colored ascitic fluid aspirated by vacuum bottle suction. Patient broke out with hives about 15 minutes into the procedure. This was felt to be most likely related to the IV infusion of albumin which began during the procedure. Vital signs remained stable. Patient was treated with 50 mg of Benadryl IV with incomplete clearance of the hives and the development of multiple additional hives over the 30-45 minutes after Benadryl administration. Patient was then treated with 125 mg of Solu-Medrol IV with resolution of hives. FINDINGS: A total of approximately 4.3 L of ascitic fluid was removed. Samples were sent to the laboratory as requested by the clinical team. IMPRESSION: Successful ultrasound-guided paracentesis yielding 4.3 liters of peritoneal fluid. Development of hives likely related to IV albumin administration, treated with 50 mg of IV Benadryl unsuccessfully and 125 mg of IV Solu-Medrol successfully. Electronically Signed   By: Lavonia Dana M.D.   On: 07/14/2016 10:47   Dg Chest Portable 1 View  Result Date:  07/18/2016 CLINICAL DATA:  Mid chest pain and shortness of breath since yesterday. Paracentesis last week. Known right pleural effusion. EXAM: PORTABLE CHEST 1 VIEW COMPARISON:  Portable chest x-ray of June 01, 2016 FINDINGS: There is a large right pleural effusion occupying 2/3 of the pleural space volume. The left lung is well-expanded and clear without evidence of a pleural effusion. The left heart border appears normal. The pulmonary vascularity is not clearly engorged. IMPRESSION: Large right pleural effusion.  No definite abnormality on the left. Electronically Signed   By: David  Martinique M.D.   On: 07/18/2016  07:19   US Abdomen Limited Ruq  Result Date: 07/14/2016 CLINICAL DATA:  Alcoholic cirrhosis.  Ascites. EXAM: US ABDOMEN LIMITED - RIGHT UPPER QUADRANT COMPARISON:  Abdominal ultrasound 08/03/2015 FINDINGS: Gallbladder: No gallstones. Borderline to mild chronic gallbladder wall thickening, likely related to chronic liver disease. No sonographic Murphy sign noted by sonographer. Common bile duct: Diameter: 4 mm Liver: Nodular, shrunken appearing liver with coarsened echotexture. No focal lesion identified. Partially visualized ascites and right pleural effusion. IMPRESSION: 1. Cirrhosis without focal liver lesion identified. 2. Ascites. Electronically Signed   By: Logan Bores M.D.   On: 07/14/2016 10:12   US Thoracentesis Asp Pleural Space W/img Guide  Result Date: 08/04/2016 INDICATION: Symptomatic right sided pleural effusion EXAM: US THORACENTESIS ASP PLEURAL SPACE W/IMG GUIDE COMPARISON:  Previous thoracentesis. MEDICATIONS: 10 cc 1% lidocaine. COMPLICATIONS: None immediate. TECHNIQUE: Informed written consent was obtained from the patient after a discussion of the risks, benefits and alternatives to treatment. A timeout was performed prior to the initiation of the procedure. Initial ultrasound scanning demonstrates a right pleural effusion. The lower chest was prepped and draped in the usual sterile fashion. 1% lidocaine was used for local anesthesia. Under direct ultrasound guidance, a 19 gauge, 7-cm, Yueh catheter was introduced. An ultrasound image was saved for documentation purposes. The thoracentesis was performed. The catheter was removed and a dressing was applied. The patient tolerated the procedure well without immediate post procedural complication. The patient was escorted to have an upright chest radiograph. FINDINGS: A total of approximately 2.5 liters of cloudy yellow fluid was removed. Requested samples were sent to the laboratory. IMPRESSION: Successful ultrasound-guided right sided  thoracentesis yielding 2.5 liters of pleural fluid. 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 Significant right pleural effusion. Recently had 2.1 L removed from the right pleural space 2 days ago. Request is made for therapeutic thoracentesis. EXAM: ULTRASOUND GUIDED THERAPEUTIC THORACENTESIS MEDICATIONS: 1% lidocaine COMPLICATIONS: None immediate. PROCEDURE: An ultrasound guided thoracentesis was thoroughly discussed with the patient and questions answered. The benefits, risks, alternatives and complications were also discussed. The patient understands and wishes to proceed with the procedure. Written consent was obtained. Ultrasound was performed to localize and mark an adequate pocket of fluid in the right chest. The area was then prepped and draped in the normal sterile fashion. 1% Lidocaine was used for local anesthesia. Under ultrasound guidance an 8 French thoracentesis catheter was introduced. Thoracentesis was performed. The catheter was removed and a dressing applied. FINDINGS: A total of approximately 2.4 L of serous fluid was removed. IMPRESSION: Successful ultrasound guided right thoracentesis yielding 2.4 L of pleural fluid. The procedure was terminated as this is around the maximum amount we takeoff at  one time for thoracentesis. The patient still has significant pleural fluid noted on his postprocedure image. 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 This was discussed with Dr. [name] so that he is aware the patient will likely need further thoracenteses. Electronically Signed   By: Lavonia Dana M.D.   On: 07/20/2016 09:20   US Thoracentesis Asp Pleural Space W/img Guide  Result Date: 07/18/2016 INDICATION: RIGHT pleural effusion, shortness of  breath EXAM: ULTRASOUND GUIDED DIAGNOSTIC AND THERAPEUTIC RIGHT THORACENTESIS MEDICATIONS: None. COMPLICATIONS: None immediate. PROCEDURE: Procedure, benefits, and risks of procedure were discussed with patient. Written informed consent for procedure was obtained. Time out protocol followed. Pleural effusion localized by ultrasound at the posterior RIGHT hemithorax. Skin prepped and draped in usual sterile fashion. Skin and soft tissues anesthetized with 10 mL of 1% lidocaine. 8 French thoracentesis catheter placed into the RIGHT pleural space. 2.1 L of yellow to amber colored ascitic fluid aspirated by syringe pump. Procedure tolerated well by patient without immediate complication. Patient developed no rash or hives from the procedure; the hives experienced during the previous paracentesis are therefore related to the IV albumin administration as suspected and not related to the lidocaine anesthesia or the chlorhexidine prep. FINDINGS: A total of approximately 2.1 L of RIGHT pleural fluid was removed. Samples were sent to the laboratory as requested by the clinical team. IMPRESSION: Successful ultrasound guided RIGHT thoracentesis yielding 2.1 L of pleural fluid. Electronically Signed   By: Lavonia Dana M.D.   On: 07/18/2016 11:26       Subjective:   Discharge Exam: Vitals:   08/04/16 1129 08/04/16 2131  BP: (!) 93/57 100/61  Pulse: 86 88  Resp: 20 20  Temp:  97.8 F (36.6 C)   Vitals:   08/04/16 0100 08/04/16 1103 08/04/16 1129 08/04/16 2131  BP:  (!) 97/52 (!) 93/57 100/61  Pulse: (!) 114 94 86 88  Resp: (!) 24 20 20 20   Temp:  97.5 F (36.4 C)  97.8 F (36.6 C)  TempSrc:  Oral  Oral  SpO2: 94% 98% 98% 98%  Weight:      Height:        General: Pt is alert, awake, not in acute distress Cardiovascular: RRR, S1/S2 +, no rubs, no gallops Respiratory: CTA bilaterally, no wheezing, no rhonchi; slightly dimished BS on the Right base  Abdominal: Soft, NT, slightly distended _ fluid  wave, bowel sounds + Extremities: no edema, no cyanosis    The results of significant diagnostics from this hospitalization (including imaging, microbiology, ancillary and laboratory) are listed below for reference.     Microbiology: Recent Results (from the past 240 hour(s))  MRSA PCR Screening     Status: None   Collection Time: 08/04/16 12:25 AM  Result Value Ref Range Status   MRSA by PCR NEGATIVE NEGATIVE Final    Comment:        The GeneXpert MRSA Assay (FDA approved for NASAL specimens only), is one component of a comprehensive MRSA colonization surveillance program. It is not intended to diagnose MRSA infection nor to guide or monitor treatment for MRSA infections.   Stat Gram stain     Status: None   Collection Time: 08/04/16 12:42 PM  Result Value Ref Range Status   Specimen Description PLEURAL  Final   Special Requests NONE  Final   Gram Stain   Final    WBC PRESENT, PREDOMINANTLY MONONUCLEAR NO ORGANISMS SEEN CYTOSPIN SMEAR Performed at [facility]  Lab, [address], [place], [place] [address]    Report Status 08/04/2016 FINAL  Final  Culture, body fluid-bottle     Status: None (Preliminary result)   Collection Time: 08/04/16 12:42 PM  Result Value Ref Range Status   Specimen Description PLEURAL COLLECTED BY DOCTOR  Final   Special Requests BOTTLES DRAWN AEROBIC AND ANAEROBIC 10 CC EACH  Final   Culture NO GROWTH < 24 HOURS  Final   Report Status PENDING  Incomplete     Labs: BNP (last 3 results)  Recent Labs  07/18/16 1845 08/03/16 2131  BNP 236.0* 937.9*   Basic Metabolic Panel:  Recent Labs Lab 08/03/16 2131 08/04/16 0549 08/05/16 0936  NA 131* 133* 135  K 3.4* 4.0 4.0  CL 96* 98* 100*  CO2 26 28 29   GLUCOSE 113* 106* 147*  BUN 9 9 12   CREATININE 0.89 0.90 1.11  CALCIUM 8.1* 7.9* 7.9*   Liver Function Tests:  Recent Labs Lab 08/03/16 2131 08/04/16 0549 08/05/16 0936  AST 54* 41 39  ALT 23 20 19   ALKPHOS 134* 109 116   BILITOT 1.9* 1.9* 1.7*  PROT 6.5 5.6* 5.6*  ALBUMIN 2.2* 2.0* 1.9*   No results for input(s): LIPASE, AMYLASE in the last 168 hours. No results for input(s): AMMONIA in the last 168 hours. CBC:  Recent Labs Lab 08/03/16 2131 08/04/16 0549 08/05/16 0936  WBC 5.8 5.2 4.1  NEUTROABS 3.0  --   --   HGB 13.6 13.2 13.5  HCT 39.4 38.7* 38.8*  MCV 103.4* 103.2* 103.2*  PLT 88* 75* 77*   Cardiac Enzymes:  Recent Labs Lab 08/03/16 2131  TROPONINI <0.03   BNP: Invalid input(s): POCBNP CBG: No results for input(s): GLUCAP in the last 168 hours. D-Dimer No results for input(s): DDIMER in the last 72 hours. Hgb A1c No results for input(s): HGBA1C in the last 72 hours. Lipid Profile No results for input(s): CHOL, HDL, LDLCALC, TRIG, CHOLHDL, LDLDIRECT in the last 72 hours. Thyroid function studies No results for input(s): TSH, T4TOTAL, T3FREE, THYROIDAB in the last 72 hours.  Invalid input(s): FREET3 Anemia work up No results for input(s): VITAMINB12, FOLATE, FERRITIN, TIBC, IRON, RETICCTPCT in the last 72 hours. Urinalysis    Component Value Date/Time   COLORURINE AMBER (A) 12/14/2015 1439   APPEARANCEUR CLEAR 12/14/2015 1439   LABSPEC 1.015 12/14/2015 1439   PHURINE 6.0 12/14/2015 1439   GLUCOSEU TRACE (A) 12/14/2015 1439   HGBUR 3+ (A) 12/14/2015 1439   BILIRUBINUR NEGATIVE 12/14/2015 1439   KETONESUR TRACE (A) 12/14/2015 1439   PROTEINUR 1+ (A) 12/14/2015 1439   UROBILINOGEN 1.0 07/26/2014 2231   NITRITE NEGATIVE 12/14/2015 1439   LEUKOCYTESUR NEGATIVE 12/14/2015 1439   Sepsis Labs Invalid input(s): PROCALCITONIN,  WBC,  LACTICIDVEN Microbiology Recent Results (from the past 240 hour(s))  MRSA PCR Screening     Status: None   Collection Time: 08/04/16 12:25 AM  Result Value Ref Range Status   MRSA by PCR NEGATIVE NEGATIVE Final    Comment:        The GeneXpert MRSA Assay (FDA approved for NASAL specimens only), is one component of a comprehensive MRSA  colonization surveillance program. It is not intended to diagnose MRSA infection nor to guide or monitor treatment for MRSA infections.   Stat Gram stain     Status: None   Collection Time: 08/04/16 12:42 PM  Result Value Ref Range Status   Specimen Description PLEURAL  Final   Special Requests NONE  Final   Gram Stain   Final    WBC PRESENT, PREDOMINANTLY MONONUCLEAR NO ORGANISMS SEEN CYTOSPIN SMEAR Performed at [facility], [place] [address] [place], [place] [address]    Report Status 08/04/2016 FINAL  Final  Culture, body fluid-bottle     Status: None (Preliminary result)   Collection Time: 08/04/16 12:42 PM  Result Value Ref Range Status   Specimen Description PLEURAL COLLECTED BY DOCTOR  Final   Special Requests BOTTLES DRAWN AEROBIC AND ANAEROBIC 10 CC EACH  Final   Culture NO GROWTH < 24 HOURS  Final   Report Status PENDING  Incomplete     Time coordinating discharge: Over 30 minutes  SIGNED:   Damita Lack, MD  [facility] 08/05/2016, 10:42 AM Pager   If 7PM-7AM, please contact night-coverage [email or web address] Password [email or web address]

## 2016-08-05 NOTE — Care Management Note (Signed)
Case Management Note  Patient Details  Name: LEEAM CEDRONE MRN: [id number] Date of Birth: 12-27-1960  Subjective/Objective:                  Pt from home with spouse, ind with ADl's. Pt seen recently, he has PCP, transportation and insurance with drug coverage. He needs no HH or DME. Pt communicates no needs.   Action/Plan: DC home today, No CM needs.   Expected Discharge Date:  08/05/16               Expected Discharge Plan:  Home/Self Care  In-House Referral:  NA  Discharge planning Services  CM Consult  Post Acute Care Choice:  NA Choice offered to:  NA  Status of Service:  Completed, signed off  Sherald Barge, RN 08/05/2016, 10:43 AM

## 2016-08-08 ENCOUNTER — Other Ambulatory Visit (INDEPENDENT_AMBULATORY_CARE_PROVIDER_SITE_OTHER): Payer: Self-pay | Admitting: Internal Medicine

## 2016-08-08 DIAGNOSIS — R188 Other ascites: Secondary | ICD-10-CM

## 2016-08-08 NOTE — Telephone Encounter (Signed)
US thoracentesis sch'd 08/11/13 at 10 ([phone]), left detailed message for patient

## 2016-08-09 LAB — COMPREHENSIVE METABOLIC PANEL
ALT: 22 U/L (ref 9–46)
AST: 50 U/L — ABNORMAL HIGH (ref 10–35)
Albumin: 2.3 g/dL — ABNORMAL LOW (ref 3.6–5.1)
Alkaline Phosphatase: 123 U/L — ABNORMAL HIGH (ref 40–115)
BUN: 9 mg/dL (ref 7–25)
CHLORIDE: 102 mmol/L (ref 98–110)
CO2: 27 mmol/L (ref 20–31)
CREATININE: 1.01 mg/dL (ref 0.70–1.33)
Calcium: 8.6 mg/dL (ref 8.6–10.3)
Glucose, Bld: 105 mg/dL — ABNORMAL HIGH (ref 65–99)
POTASSIUM: 4.5 mmol/L (ref 3.5–5.3)
SODIUM: 136 mmol/L (ref 135–146)
Total Bilirubin: 1.9 mg/dL — ABNORMAL HIGH (ref 0.2–1.2)
Total Protein: 6.5 g/dL (ref 6.1–8.1)

## 2016-08-09 LAB — CULTURE, BODY FLUID W GRAM STAIN -BOTTLE: Culture: NO GROWTH

## 2016-08-09 LAB — CULTURE, BODY FLUID-BOTTLE

## 2016-08-09 LAB — CBC WITH DIFFERENTIAL/PLATELET
BASOS PCT: 1 %
Basophils Absolute: 47 cells/uL (ref 0–200)
Eosinophils Absolute: 141 cells/uL (ref 15–500)
Eosinophils Relative: 3 %
HEMATOCRIT: 42.7 % (ref 38.5–50.0)
HEMOGLOBIN: 14.1 g/dL (ref 13.2–17.1)
LYMPHS ABS: 1504 {cells}/uL (ref 850–3900)
Lymphocytes Relative: 32 %
MCH: 34.7 pg — ABNORMAL HIGH (ref 27.0–33.0)
MCHC: 33 g/dL (ref 32.0–36.0)
MCV: 105.2 fL — ABNORMAL HIGH (ref 80.0–100.0)
MONO ABS: 658 {cells}/uL (ref 200–950)
MPV: 11.9 fL (ref 7.5–12.5)
Monocytes Relative: 14 %
NEUTROS ABS: 2350 {cells}/uL (ref 1500–7800)
Neutrophils Relative %: 50 %
Platelets: 107 10*3/uL — ABNORMAL LOW (ref 140–400)
RBC: 4.06 MIL/uL — AB (ref 4.20–5.80)
RDW: 15.2 % — ABNORMAL HIGH (ref 11.0–15.0)
WBC: 4.7 10*3/uL (ref 3.8–10.8)

## 2016-08-09 LAB — MAGNESIUM: Magnesium: 1.7 mg/dL (ref 1.5–2.5)

## 2016-08-10 ENCOUNTER — Encounter (INDEPENDENT_AMBULATORY_CARE_PROVIDER_SITE_OTHER): Payer: Self-pay | Admitting: *Deleted

## 2016-08-10 ENCOUNTER — Other Ambulatory Visit (INDEPENDENT_AMBULATORY_CARE_PROVIDER_SITE_OTHER): Payer: Self-pay | Admitting: *Deleted

## 2016-08-10 ENCOUNTER — Encounter (INDEPENDENT_AMBULATORY_CARE_PROVIDER_SITE_OTHER): Payer: Self-pay | Admitting: Internal Medicine

## 2016-08-10 ENCOUNTER — Ambulatory Visit (INDEPENDENT_AMBULATORY_CARE_PROVIDER_SITE_OTHER): Payer: Medicare Other | Admitting: Internal Medicine

## 2016-08-10 VITALS — BP 102/70 | HR 80 | Temp 97.9°F | Ht 75.0 in | Wt 196.9 lb

## 2016-08-10 DIAGNOSIS — K7031 Alcoholic cirrhosis of liver with ascites: Secondary | ICD-10-CM | POA: Diagnosis not present

## 2016-08-10 NOTE — Patient Instructions (Signed)
OV in 6 weeks. Labs in 2 weeks

## 2016-08-10 NOTE — Progress Notes (Signed)
Subjective:    Patient ID: [name], male    DOB: 1960-08-04, 56 y.o.   MRN: [id number]  HPI  Here today for f/u. Hx of alcoholic cirrhosis. 06/02/2016 underwent 1st paracentesis with removal of 4 liters of fluid. Recently admitted to [facility] and underwent a thoracentesis with the removal of 2.5 lters of fluid. He was SOB. He states today he feels good. His Lasix was increased to 40mg  and Aldactone increased to 50mg  daily. He says he had a cough yesterday but today it is better. No periods of confusion. He is having a BM twice a day. Appetite is good.  No etoh in 1 year. Hep C antibody negative.  Scheduled for thoracentesis 08/11/2016  CMP Latest Ref Rng & Units 08/08/2016 08/05/2016 08/04/2016  Glucose 65 - 99 mg/dL 105(H) 147(H) 106(H)  BUN 7 - 25 mg/dL 9 12 9   Creatinine 0.70 - 1.33 mg/dL 1.01 1.11 0.90  Sodium 135 - 146 mmol/L 136 135 133(L)  Potassium 3.5 - 5.3 mmol/L 4.5 4.0 4.0  Chloride 98 - 110 mmol/L 102 100(L) 98(L)  CO2 20 - 31 mmol/L 27 29 28   Calcium 8.6 - 10.3 mg/dL 8.6 7.9(L) 7.9(L)  Total Protein 6.1 - 8.1 g/dL 6.5 5.6(L) 5.6(L)  Total Bilirubin 0.2 - 1.2 mg/dL 1.9(H) 1.7(H) 1.9(H)  Alkaline Phos 40 - 115 U/L 123(H) 116 109  AST 10 - 35 U/L 50(H) 39 41  ALT 9 - 46 U/L 22 19 20    CBC    Component Value Date/Time   WBC 4.7 08/08/2016 1425   RBC 4.06 (L) 08/08/2016 1425   HGB 14.1 08/08/2016 1425   HCT 42.7 08/08/2016 1425   PLT 107 (L) 08/08/2016 1425   MCV 105.2 (H) 08/08/2016 1425   MCH 34.7 (H) 08/08/2016 1425   MCHC 33.0 08/08/2016 1425   RDW 15.2 (H) 08/08/2016 1425   LYMPHSABS 1,504 08/08/2016 1425   MONOABS 658 08/08/2016 1425   EOSABS 141 08/08/2016 1425   BASOSABS 47 08/08/2016 1425  03/25/2015 EGD/Colonoscopy:   Indications:Patient is 56 year old Caucasian male with history of colonic adenomas and alcoholic cirrhosis who is undergoing surveillance colonoscopy in diagnostic EGD because of history of hematemesis. He has never been screened for  esophageal or gastric varices. Impression:  EGD findings: Single patch of salmon colored mucosa consistent with short segment Barrett's esophagus. Biopsies taken. No evidence of esophageal or gastric varices. Portal gastropathy. Erosive gastritis and duodenitis. Small prepyloric ulcer noted.  Colonoscopy findings; Examination performed to cecum. Small polyp ablated via cold biopsy from transverse colon. Left-sided diverticulosis. Internal and external hemorrhoids.  H. pylori is negative. Colonic polyp is tubular adenoma.   Review of Systems Past Medical History:  Diagnosis Date  . Anxiety   . Chronic back pain   . DDD (degenerative disc disease), lumbosacral   . Depression   . Elevated LFTs    secondary to ETOH  . History of alcohol abuse   . Hx of cardiac cath 1996  . Hyperlipidemia   . Hypertension   . Stroke [name]) 2003    Past Surgical History:  Procedure Laterality Date  . CARDIAC CATHETERIZATION  1996  . COLONOSCOPY N/A 03/25/2015   Procedure: COLONOSCOPY;  Surgeon: Rogene Houston, MD;  Location: [facility];  Service: Endoscopy;  Laterality: N/A;  1130  . ESOPHAGOGASTRODUODENOSCOPY N/A 03/25/2015   Procedure: ESOPHAGOGASTRODUODENOSCOPY (EGD);  Surgeon: Rogene Houston, MD;  Location: [facility];  Service: Endoscopy;  Laterality: N/A;    Allergies  Allergen Reactions  . Albumin (Human) Hives and Itching  . Bee Venom Anaphylaxis and Nausea Only  . Bee Venom Anaphylaxis, Nausea And Vomiting and Swelling    Throat swelling  . Other Hives    Pt states there was a fluid through IV he was getting while getting fluid taken off his stomach. It gave him hives    Current Outpatient Prescriptions on File Prior to Visit  Medication Sig Dispense Refill  . busPIRone (BUSPAR) 7.5 MG tablet Take 1 tablet (7.5 mg total) by mouth 2 (two) times daily. FOR ANXIETY 60 tablet 3  . CARAFATE 1 GM/10ML suspension TAKE TWO TEASPOONSFUL (10 ML) BY MOUTH FOUR TIMES DAILY - WITH  MEALS AND AT BEDTIME 420 mL 1  . carvedilol (COREG) 6.25 MG tablet Take 1 tablet (6.25 mg total) by mouth 2 (two) times daily. 180 tablet 3  . EPINEPHrine 0.3 mg/0.3 mL IJ SOAJ injection Inject 0.3 mLs (0.3 mg total) into the muscle once. 1 Device 1  . FLUoxetine (PROZAC) 20 MG tablet Take 1 tablet (20 mg total) by mouth daily. 30 tablet 3  . furosemide (LASIX) 40 MG tablet Take 1 tablet (40 mg total) by mouth 2 (two) times daily. 60 tablet 0  . lactulose (CHRONULAC) 10 GM/15ML solution TAKE 22.5ML BY MOUTH TWICE DAILY 473 mL 1  . lisinopril (PRINIVIL,ZESTRIL) 2.5 MG tablet Take 1 tablet (2.5 mg total) by mouth daily. 30 tablet 1  . omeprazole (PRILOSEC) 20 MG capsule Take 1 capsule (20 mg total) by mouth 2 (two) times daily. 60 capsule 3  . spironolactone (ALDACTONE) 50 MG tablet Take 1 tablet (50 mg total) by mouth daily. 30 tablet 0  . thiamine (VITAMIN B-1) 50 MG tablet Take by mouth daily.     No current facility-administered medications on file prior to visit.         Objective:   Physical Exam Blood pressure 102/70, pulse 80, temperature 97.9 F (36.6 C), height 6\' 3"  (1.905 m), weight 196 lb 14.4 oz (89.3 kg). Alert and oriented. Skin warm and dry. Oral mucosa is moist.   . Sclera anicteric, conjunctivae is pink. Thyroid not enlarged. No cervical lymphadenopathy. Lungs clear. Heart regular rate and rhythm.  Abdomen is soft. Bowel sounds are positive. No hepatomegaly. No abdominal masses felt. No tenderness. Abdomen slightly distended but not tense. Abdomen is soft.   No edema to lower extremities. .         Assessment & Plan:  Alcoholic cirrhosis with ascites. OV in 6 weeks. Labs CBC, CMET, and ammonia in 2 weeks Keep appt for thoracentesis scheduled for tomorrow.

## 2016-08-11 ENCOUNTER — Ambulatory Visit (HOSPITAL_COMMUNITY)
Admission: RE | Admit: 2016-08-11 | Discharge: 2016-08-11 | Disposition: A | Discharge status: Home / Self Care | Payer: Medicare Other | Source: Ambulatory Visit | Attending: Internal Medicine | Admitting: Internal Medicine

## 2016-08-11 ENCOUNTER — Ambulatory Visit (HOSPITAL_COMMUNITY)
Admission: RE | Admit: 2016-08-11 | Discharge: 2016-08-11 | Disposition: A | Discharge status: Home / Self Care | Payer: Medicare Other | Source: Ambulatory Visit | Attending: Radiology | Admitting: Radiology

## 2016-08-11 DIAGNOSIS — J90 Pleural effusion, not elsewhere classified: Secondary | ICD-10-CM | POA: Insufficient documentation

## 2016-08-11 DIAGNOSIS — I959 Hypotension, unspecified: Secondary | ICD-10-CM | POA: Insufficient documentation

## 2016-08-11 DIAGNOSIS — R188 Other ascites: Secondary | ICD-10-CM

## 2016-08-11 DIAGNOSIS — J9811 Atelectasis: Secondary | ICD-10-CM | POA: Diagnosis not present

## 2016-08-11 DIAGNOSIS — I9788 Other intraoperative complications of the circulatory system, not elsewhere classified: Secondary | ICD-10-CM | POA: Diagnosis not present

## 2016-08-11 LAB — GRAM STAIN

## 2016-08-11 LAB — BODY FLUID CELL COUNT WITH DIFFERENTIAL
Lymphs, Fluid: 66 %
Monocyte-Macrophage-Serous Fluid: 34 % — ABNORMAL LOW (ref 50–90)
Total Nucleated Cell Count, Fluid: 227 uL (ref 0–1000)

## 2016-08-11 NOTE — Sedation Documentation (Addendum)
Patient tolerated procedure well. Dermabond applied to puncture site right posterior chest.

## 2016-08-11 NOTE — Procedures (Signed)
   US guided Rt thoracentesis 2.2 L yellow fluid  BP low in procedure 81/40s and 73/46 Now 83/50s  Pt is asymptomatic; HR 77 Dr Thornton Papas made aware  CXR pending  Will hold pt after procedure for every 10 minute BP check x 3

## 2016-08-11 NOTE — Sedation Documentation (Signed)
Patient denies pain and is resting comfortably.  

## 2016-08-11 NOTE — Sedation Documentation (Signed)
Patient identified as [name] by Jannifer Franklin PA, Megan RT and Julieta Bellini RN

## 2016-08-11 NOTE — Sedation Documentation (Signed)
PA at bedside.

## 2016-08-11 NOTE — Sedation Documentation (Signed)
Patient discharged per Dr. Thornton Papas. VS improved. Patient alert, oriented and no complaints of pain or dizziness throughout procedure.  Dr. Thornton Papas and Jannifer Franklin PA reviewed follow up and patient verbalizes understanding.

## 2016-08-11 NOTE — Sedation Documentation (Signed)
Dr. Thornton Papas at bedside.

## 2016-08-12 LAB — PATHOLOGIST SMEAR REVIEW

## 2016-08-16 LAB — CULTURE, BODY FLUID-BOTTLE: CULTURE: NO GROWTH

## 2016-08-16 LAB — CULTURE, BODY FLUID W GRAM STAIN -BOTTLE

## 2016-08-27 ENCOUNTER — Other Ambulatory Visit: Payer: Self-pay | Admitting: Family Medicine

## 2016-08-29 NOTE — Telephone Encounter (Signed)
Refill appropriate and filled per protocol. 

## 2016-09-07 ENCOUNTER — Ambulatory Visit (INDEPENDENT_AMBULATORY_CARE_PROVIDER_SITE_OTHER): Payer: Medicare Other | Admitting: Cardiology

## 2016-09-07 ENCOUNTER — Encounter: Payer: Self-pay | Admitting: Cardiology

## 2016-09-07 ENCOUNTER — Other Ambulatory Visit (HOSPITAL_COMMUNITY)
Admission: RE | Admit: 2016-09-07 | Discharge: 2016-09-07 | Disposition: A | Discharge status: Home / Self Care | Payer: Medicare Other | Source: Ambulatory Visit | Attending: Cardiology | Admitting: Cardiology

## 2016-09-07 VITALS — BP 102/62 | HR 58 | Ht 72.0 in | Wt 187.0 lb

## 2016-09-07 DIAGNOSIS — J90 Pleural effusion, not elsewhere classified: Secondary | ICD-10-CM

## 2016-09-07 DIAGNOSIS — I5022 Chronic systolic (congestive) heart failure: Secondary | ICD-10-CM | POA: Diagnosis not present

## 2016-09-07 DIAGNOSIS — I4891 Unspecified atrial fibrillation: Secondary | ICD-10-CM

## 2016-09-07 LAB — COMPREHENSIVE METABOLIC PANEL
ALBUMIN: 2.6 g/dL — AB (ref 3.5–5.0)
ALK PHOS: 152 U/L — AB (ref 38–126)
ALT: 25 U/L (ref 17–63)
AST: 44 U/L — AB (ref 15–41)
Anion gap: 7 (ref 5–15)
BUN: 17 mg/dL (ref 6–20)
CO2: 27 mmol/L (ref 22–32)
CREATININE: 1.15 mg/dL (ref 0.61–1.24)
Calcium: 8.8 mg/dL — ABNORMAL LOW (ref 8.9–10.3)
Chloride: 100 mmol/L — ABNORMAL LOW (ref 101–111)
GFR calc Af Amer: >60 mL/min (ref >60)
GLUCOSE: 111 mg/dL — AB (ref 65–99)
Potassium: 4.1 mmol/L (ref 3.5–5.1)
Sodium: 134 mmol/L — ABNORMAL LOW (ref 135–145)
TOTAL PROTEIN: 7.1 g/dL (ref 6.5–8.1)
Total Bilirubin: 1.5 mg/dL — ABNORMAL HIGH (ref 0.3–1.2)

## 2016-09-07 LAB — MAGNESIUM: Magnesium: 1.6 mg/dL — ABNORMAL LOW (ref 1.7–2.4)

## 2016-09-07 MED ORDER — SILDENAFIL CITRATE 100 MG PO TABS: 50.0000 mg | ORAL_TABLET | Freq: Every day | ORAL | 11 refills | Status: AC | PRN

## 2016-09-07 NOTE — Patient Instructions (Signed)
Medication Instructions:  Viagra 100 mg - take 1/2 tablet ( 50 mg ) 30 MINUTES PRIOR TO SEXUAL ENGAGEMENT   Labwork: TODAY  CMET MAGNESIUM   Testing/Procedures: NONE  Follow-Up: Your physician recommends that you schedule a follow-up appointment in: 4 MONTHS    Any Other Special Instructions Will Be Listed Below (If Applicable). You have been referred to DR. HAWKINS , SOMEONE FROM HIS OFFICE WILL CONTACT YOU SOON.      If you need a refill on your cardiac medications before your next appointment, please call your pharmacy.

## 2016-09-07 NOTE — Progress Notes (Signed)
Clinical Summary [name] is a 56 y.o.male seen today for follow up of the following medical problems.   1. Afib - not on anticoag due to history of falls, cirrhosis, and history of bleeding ulcers - admit 05/2016 with afib with RVR and fluid overload  - can have some palpitations at times, overall mild and infrequent.    2. EtOH cirrhisos - followed by GI  3. HTN - he remains compliant with meds  4. Chronic sytolic HF - noted during 05/2016 admission, LVEF 40-45%. - presented volume overloaded in setting of afib with RVR, as well as cirrhosis with ascites. Had 4L paracentesis - we lowered lasix to 40mg  alternating days with 20mg  due to decrease in kidney function by 06/2016 labs - weights down 9 lbs. No LE edema, mild abdomoinla distension -taking lasix 40mg  bid.  - limiting sodium intake.  - no recent SOB/DOE.   5. Pleural effusion - 07/2016 admission with SOB, s/p 2L thoracentesis. - he had repeat thoracentesis later that month  Past Medical History:  Diagnosis Date  . Anxiety   . Chronic back pain   . DDD (degenerative disc disease), lumbosacral   . Depression   . Elevated LFTs    secondary to ETOH  . History of alcohol abuse   . Hx of cardiac cath 1996  . Hyperlipidemia   . Hypertension   . Stroke [facility]) 2003     Allergies  Allergen Reactions  . Albumin (Human) Hives and Itching  . Bee Venom Anaphylaxis and Nausea Only  . Bee Venom Anaphylaxis, Nausea And Vomiting and Swelling    Throat swelling  . Other Hives    Pt states there was a fluid through IV he was getting while getting fluid taken off his stomach. It gave him hives     Current Outpatient Prescriptions  Medication Sig Dispense Refill  . busPIRone (BUSPAR) 7.5 MG tablet Take 1 tablet (7.5 mg total) by mouth 2 (two) times daily. FOR ANXIETY 60 tablet 3  . CARAFATE 1 GM/10ML suspension TAKE TWO TEASPOONSFUL (10 ML) BY MOUTH FOUR TIMES DAILY - WITH MEALS AND AT BEDTIME 420 mL 1  .  carvedilol (COREG) 6.25 MG tablet Take 1 tablet (6.25 mg total) by mouth 2 (two) times daily. 180 tablet 3  . EPINEPHrine 0.3 mg/0.3 mL IJ SOAJ injection Inject 0.3 mLs (0.3 mg total) into the muscle once. 1 Device 1  . FLUoxetine (PROZAC) 20 MG tablet Take 1 tablet (20 mg total) by mouth daily. 30 tablet 3  . furosemide (LASIX) 40 MG tablet Take 1 tablet (40 mg total) by mouth 2 (two) times daily. 60 tablet 0  . lactulose (CHRONULAC) 10 GM/15ML solution TAKE 22.5ML BY MOUTH TWICE DAILY 473 mL 3  . lisinopril (PRINIVIL,ZESTRIL) 2.5 MG tablet Take 1 tablet (2.5 mg total) by mouth daily. 30 tablet 1  . omeprazole (PRILOSEC) 20 MG capsule Take 1 capsule (20 mg total) by mouth 2 (two) times daily. 60 capsule 3  . spironolactone (ALDACTONE) 50 MG tablet Take 1 tablet (50 mg total) by mouth daily. 30 tablet 0  . thiamine (VITAMIN B-1) 50 MG tablet Take by mouth daily.     No current facility-administered medications for this visit.      Past Surgical History:  Procedure Laterality Date  . CARDIAC CATHETERIZATION  1996  . COLONOSCOPY N/A 03/25/2015   Procedure: COLONOSCOPY;  Surgeon: Rogene Houston, MD;  Location: [facility];  Service: Endoscopy;  Laterality: N/A;  1130  . ESOPHAGOGASTRODUODENOSCOPY N/A 03/25/2015   Procedure: ESOPHAGOGASTRODUODENOSCOPY (EGD);  Surgeon: Rogene Houston, MD;  Location: [facility];  Service: Endoscopy;  Laterality: N/A;     Allergies  Allergen Reactions  . Albumin (Human) Hives and Itching  . Bee Venom Anaphylaxis and Nausea Only  . Bee Venom Anaphylaxis, Nausea And Vomiting and Swelling    Throat swelling  . Other Hives    Pt states there was a fluid through IV he was getting while getting fluid taken off his stomach. It gave him hives      Family History  Problem Relation Age of Onset  . Heart disease Mother   . Cancer Father        throat cancer  . Cancer Brother        melanoma  . Thyroid disease Sister      Social History Mr. Salomon  reports that he has never smoked. He quit smokeless tobacco use about 2 years ago. His smokeless tobacco use included Chew. Mr. Vincent reports that he does not drink alcohol.   Review of Systems CONSTITUTIONAL: No weight loss, fever, chills, weakness or fatigue.  HEENT: Eyes: No visual loss, blurred vision, double vision or yellow sclerae.No hearing loss, sneezing, congestion, runny nose or sore throat.  SKIN: No rash or itching.  CARDIOVASCULAR: per hpi RESPIRATORY: No shortness of breath, cough or sputum.  GASTROINTESTINAL: No anorexia, nausea, vomiting or diarrhea. No abdominal pain or blood.  GENITOURINARY: No burning on urination, no polyuria NEUROLOGICAL: No headache, dizziness, syncope, paralysis, ataxia, numbness or tingling in the extremities. No change in bowel or bladder control.  MUSCULOSKELETAL: No muscle, back pain, joint pain or stiffness.  LYMPHATICS: No enlarged nodes. No history of splenectomy.  PSYCHIATRIC: No history of depression or anxiety.  ENDOCRINOLOGIC: No reports of sweating, cold or heat intolerance. No polyuria or polydipsia.  Marland Kitchen   Physical Examination Vitals:   09/07/16 1408  BP: 102/62  Pulse: (!) 58   Vitals:   09/07/16 1408  Weight: 187 lb (84.8 kg)  Height: 6' (1.829 m)    Gen: resting comfortably, no acute distress HEENT: no scleral icterus, pupils equal round and reactive, no palptable cervical adenopathy,  CV: RRR, no m/r/g, no jvd Resp: Decreased breath sounds RLL GI: abdomen is soft, non-tender, non-distended, normal bowel sounds, no hepatosplenomegaly MSK: extremities are warm, no edema.  Skin: warm, no rash Neuro:  no focal deficits Psych: appropriate affect    Assessment and Plan  1. Afib - not on anticoag as described above - overall no significant symptoms, conitnue current meds  2. Chronic sysotlic HF - medical therapy limited by soft bp's - no recent symptoms. Continue current meds  3. Cirrhosis - f/u with GI  4.  Recurrent pleural effusions - refer to pulmonary     Arnoldo Lenis, M.D.

## 2016-09-12 ENCOUNTER — Telehealth: Payer: Self-pay

## 2016-09-12 NOTE — Telephone Encounter (Signed)
Called pt no answer, left message for pt to return call.

## 2016-09-12 NOTE — Telephone Encounter (Signed)
-----   Message from Arnoldo Lenis, MD sent at 09/12/2016  1:50 PM EDT ----- Labs show magnesium is a little low. Start magnesium oxided 400mg  bid x 3 days, then 400mg  daily. Liver tests remain mildly elevated.   Zandra Abts MD

## 2016-09-13 ENCOUNTER — Telehealth: Payer: Self-pay

## 2016-09-13 NOTE — Telephone Encounter (Signed)
Called pt., no answer. Left message for pt. To return call.  

## 2016-09-13 NOTE — Telephone Encounter (Signed)
-----   Message from Arnoldo Lenis, MD sent at 09/12/2016  1:50 PM EDT ----- Labs show magnesium is a little low. Start magnesium oxided 400mg  bid x 3 days, then 400mg  daily. Liver tests remain mildly elevated.   Zandra Abts MD

## 2016-09-21 ENCOUNTER — Ambulatory Visit (INDEPENDENT_AMBULATORY_CARE_PROVIDER_SITE_OTHER): Payer: Medicare Other | Admitting: Internal Medicine

## 2016-10-07 ENCOUNTER — Ambulatory Visit: Payer: Medicare Other | Admitting: Family Medicine

## 2016-10-10 ENCOUNTER — Encounter: Payer: Self-pay | Admitting: Family Medicine

## 2016-11-09 ENCOUNTER — Inpatient Hospital Stay (HOSPITAL_COMMUNITY)
Admission: EM | Admit: 2016-11-09 | Discharge: 2016-11-12 | DRG: 292 | Disposition: A | Discharge status: Home / Self Care | Payer: Medicare Other | Attending: Internal Medicine | Admitting: Internal Medicine

## 2016-11-09 ENCOUNTER — Emergency Department (HOSPITAL_COMMUNITY): Payer: Medicare Other

## 2016-11-09 ENCOUNTER — Encounter (HOSPITAL_COMMUNITY): Payer: Self-pay | Admitting: Emergency Medicine

## 2016-11-09 DIAGNOSIS — D6959 Other secondary thrombocytopenia: Secondary | ICD-10-CM | POA: Diagnosis present

## 2016-11-09 DIAGNOSIS — K7031 Alcoholic cirrhosis of liver with ascites: Secondary | ICD-10-CM | POA: Diagnosis present

## 2016-11-09 DIAGNOSIS — R0602 Shortness of breath: Secondary | ICD-10-CM

## 2016-11-09 DIAGNOSIS — Z87891 Personal history of nicotine dependence: Secondary | ICD-10-CM

## 2016-11-09 DIAGNOSIS — F1010 Alcohol abuse, uncomplicated: Secondary | ICD-10-CM | POA: Diagnosis present

## 2016-11-09 DIAGNOSIS — F329 Major depressive disorder, single episode, unspecified: Secondary | ICD-10-CM | POA: Diagnosis present

## 2016-11-09 DIAGNOSIS — I480 Paroxysmal atrial fibrillation: Secondary | ICD-10-CM

## 2016-11-09 DIAGNOSIS — I5043 Acute on chronic combined systolic (congestive) and diastolic (congestive) heart failure: Secondary | ICD-10-CM | POA: Diagnosis present

## 2016-11-09 DIAGNOSIS — Z23 Encounter for immunization: Secondary | ICD-10-CM

## 2016-11-09 DIAGNOSIS — Z9889 Other specified postprocedural states: Secondary | ICD-10-CM | POA: Diagnosis not present

## 2016-11-09 DIAGNOSIS — F1011 Alcohol abuse, in remission: Secondary | ICD-10-CM

## 2016-11-09 DIAGNOSIS — K7460 Unspecified cirrhosis of liver: Secondary | ICD-10-CM | POA: Diagnosis not present

## 2016-11-09 DIAGNOSIS — D696 Thrombocytopenia, unspecified: Secondary | ICD-10-CM | POA: Diagnosis present

## 2016-11-09 DIAGNOSIS — I4891 Unspecified atrial fibrillation: Secondary | ICD-10-CM | POA: Diagnosis present

## 2016-11-09 DIAGNOSIS — Z8249 Family history of ischemic heart disease and other diseases of the circulatory system: Secondary | ICD-10-CM

## 2016-11-09 DIAGNOSIS — F419 Anxiety disorder, unspecified: Secondary | ICD-10-CM | POA: Diagnosis present

## 2016-11-09 DIAGNOSIS — I110 Hypertensive heart disease with heart failure: Secondary | ICD-10-CM | POA: Diagnosis present

## 2016-11-09 DIAGNOSIS — I252 Old myocardial infarction: Secondary | ICD-10-CM | POA: Diagnosis not present

## 2016-11-09 DIAGNOSIS — I9589 Other hypotension: Secondary | ICD-10-CM | POA: Diagnosis not present

## 2016-11-09 DIAGNOSIS — K439 Ventral hernia without obstruction or gangrene: Secondary | ICD-10-CM | POA: Diagnosis present

## 2016-11-09 DIAGNOSIS — I10 Essential (primary) hypertension: Secondary | ICD-10-CM | POA: Diagnosis present

## 2016-11-09 DIAGNOSIS — R188 Other ascites: Secondary | ICD-10-CM | POA: Diagnosis present

## 2016-11-09 DIAGNOSIS — I2510 Atherosclerotic heart disease of native coronary artery without angina pectoris: Secondary | ICD-10-CM | POA: Diagnosis present

## 2016-11-09 DIAGNOSIS — I959 Hypotension, unspecified: Secondary | ICD-10-CM | POA: Diagnosis not present

## 2016-11-09 DIAGNOSIS — Z8673 Personal history of transient ischemic attack (TIA), and cerebral infarction without residual deficits: Secondary | ICD-10-CM

## 2016-11-09 DIAGNOSIS — E785 Hyperlipidemia, unspecified: Secondary | ICD-10-CM | POA: Diagnosis present

## 2016-11-09 DIAGNOSIS — I952 Hypotension due to drugs: Secondary | ICD-10-CM | POA: Diagnosis not present

## 2016-11-09 DIAGNOSIS — J90 Pleural effusion, not elsewhere classified: Secondary | ICD-10-CM | POA: Diagnosis present

## 2016-11-09 DIAGNOSIS — I509 Heart failure, unspecified: Secondary | ICD-10-CM

## 2016-11-09 HISTORY — DX: Hepatic failure, unspecified without coma: K72.90

## 2016-11-09 HISTORY — DX: Unspecified cirrhosis of liver: K74.60

## 2016-11-09 LAB — COMPREHENSIVE METABOLIC PANEL
ALBUMIN: 2.5 g/dL — AB (ref 3.5–5.0)
ALK PHOS: 143 U/L — AB (ref 38–126)
ALT: 26 U/L (ref 17–63)
ANION GAP: 14 (ref 5–15)
AST: 83 U/L — ABNORMAL HIGH (ref 15–41)
BILIRUBIN TOTAL: 2.4 mg/dL — AB (ref 0.3–1.2)
BUN: 8 mg/dL (ref 6–20)
CALCIUM: 8.2 mg/dL — AB (ref 8.9–10.3)
CO2: 22 mmol/L (ref 22–32)
Chloride: 101 mmol/L (ref 101–111)
Creatinine, Ser: 0.74 mg/dL (ref 0.61–1.24)
GLUCOSE: 101 mg/dL — AB (ref 65–99)
Potassium: 3.1 mmol/L — ABNORMAL LOW (ref 3.5–5.1)
Sodium: 137 mmol/L (ref 135–145)
TOTAL PROTEIN: 6.9 g/dL (ref 6.5–8.1)

## 2016-11-09 LAB — CBC WITH DIFFERENTIAL/PLATELET
BASOS ABS: 0 10*3/uL (ref 0.0–0.1)
Basophils Relative: 1 %
Eosinophils Absolute: 0 10*3/uL (ref 0.0–0.7)
Eosinophils Relative: 1 %
HEMATOCRIT: 35.6 % — AB (ref 39.0–52.0)
Hemoglobin: 12.3 g/dL — ABNORMAL LOW (ref 13.0–17.0)
LYMPHS PCT: 31 %
Lymphs Abs: 1.3 10*3/uL (ref 0.7–4.0)
MCH: 36.2 pg — AB (ref 26.0–34.0)
MCHC: 34.6 g/dL (ref 30.0–36.0)
MCV: 104.7 fL — AB (ref 78.0–100.0)
MONO ABS: 0.4 10*3/uL (ref 0.1–1.0)
Monocytes Relative: 10 %
NEUTROS ABS: 2.4 10*3/uL (ref 1.7–7.7)
Neutrophils Relative %: 58 %
Platelets: 53 10*3/uL — ABNORMAL LOW (ref 150–400)
RBC: 3.4 MIL/uL — AB (ref 4.22–5.81)
RDW: 16.7 % — AB (ref 11.5–15.5)
WBC: 4.2 10*3/uL (ref 4.0–10.5)

## 2016-11-09 LAB — TROPONIN I: Troponin I: <0.03 ng/mL (ref <0.03)

## 2016-11-09 LAB — BRAIN NATRIURETIC PEPTIDE: B NATRIURETIC PEPTIDE 5: 725 pg/mL — AB (ref 0.0–100.0)

## 2016-11-09 MED ORDER — ZOLPIDEM TARTRATE 5 MG PO TABS
5.0000 mg | ORAL_TABLET | Freq: Every evening | ORAL | Status: DC | PRN
  Administered 2016-11-09: 5 mg via ORAL
  Filled 2016-11-09: qty 1

## 2016-11-09 MED ORDER — LACTULOSE 10 GM/15ML PO SOLN
20.0000 g | Freq: Two times a day (BID) | ORAL | Status: DC
  Administered 2016-11-09 – 2016-11-12 (×6): 20 g via ORAL
  Filled 2016-11-09 (×6): qty 30

## 2016-11-09 MED ORDER — HYDROCOD POLST-CPM POLST ER 10-8 MG/5ML PO SUER
5.0000 mL | Freq: Two times a day (BID) | ORAL | Status: DC | PRN
  Administered 2016-11-09: 5 mL via ORAL
  Filled 2016-11-09: qty 5

## 2016-11-09 MED ORDER — SODIUM CHLORIDE 0.9 % IV SOLN: 250.0000 mL | INTRAVENOUS | Status: DC | PRN

## 2016-11-09 MED ORDER — DILTIAZEM LOAD VIA INFUSION
10.0000 mg | Freq: Once | INTRAVENOUS | Status: AC
  Administered 2016-11-09: 10 mg via INTRAVENOUS
  Filled 2016-11-09: qty 10

## 2016-11-09 MED ORDER — FUROSEMIDE 10 MG/ML IJ SOLN
40.0000 mg | Freq: Once | INTRAMUSCULAR | Status: AC
  Administered 2016-11-09: 40 mg via INTRAVENOUS
  Filled 2016-11-09: qty 4

## 2016-11-09 MED ORDER — DILTIAZEM HCL-DEXTROSE 100-5 MG/100ML-% IV SOLN (PREMIX)
5.0000 mg/h | INTRAVENOUS | Status: DC
  Administered 2016-11-09: 7 mg/h via INTRAVENOUS
  Administered 2016-11-09 – 2016-11-10 (×2): 5 mg/h via INTRAVENOUS
  Filled 2016-11-09 (×3): qty 100

## 2016-11-09 MED ORDER — INFLUENZA VAC SPLIT QUAD 0.5 ML IM SUSY
0.5000 mL | PREFILLED_SYRINGE | INTRAMUSCULAR | Status: AC
  Administered 2016-11-10: 0.5 mL via INTRAMUSCULAR
  Filled 2016-11-09: qty 0.5

## 2016-11-09 MED ORDER — ONDANSETRON 8 MG PO TBDP
8.0000 mg | ORAL_TABLET | Freq: Once | ORAL | Status: AC
  Administered 2016-11-09: 8 mg via ORAL
  Filled 2016-11-09: qty 1

## 2016-11-09 MED ORDER — LISINOPRIL 5 MG PO TABS
2.5000 mg | ORAL_TABLET | Freq: Every day | ORAL | Status: DC
  Administered 2016-11-10 – 2016-11-11 (×2): 2.5 mg via ORAL
  Filled 2016-11-09 (×2): qty 1

## 2016-11-09 MED ORDER — FUROSEMIDE 10 MG/ML IJ SOLN
20.0000 mg | Freq: Two times a day (BID) | INTRAMUSCULAR | Status: DC
  Administered 2016-11-09 – 2016-11-11 (×4): 20 mg via INTRAVENOUS
  Filled 2016-11-09 (×4): qty 2

## 2016-11-09 MED ORDER — FLUOXETINE HCL 20 MG PO TABS
20.0000 mg | ORAL_TABLET | Freq: Every day | ORAL | Status: DC
  Filled 2016-11-09 (×2): qty 1

## 2016-11-09 MED ORDER — BUSPIRONE HCL 5 MG PO TABS
7.5000 mg | ORAL_TABLET | Freq: Two times a day (BID) | ORAL | Status: DC
  Administered 2016-11-09 – 2016-11-12 (×6): 7.5 mg via ORAL
  Filled 2016-11-09 (×6): qty 2

## 2016-11-09 MED ORDER — ACETAMINOPHEN 650 MG RE SUPP: 650.0000 mg | Freq: Four times a day (QID) | RECTAL | Status: DC | PRN

## 2016-11-09 MED ORDER — FLUOXETINE HCL 20 MG PO CAPS
20.0000 mg | ORAL_CAPSULE | Freq: Every day | ORAL | Status: DC
  Administered 2016-11-10 – 2016-11-12 (×3): 20 mg via ORAL
  Filled 2016-11-09 (×3): qty 1

## 2016-11-09 MED ORDER — VITAMIN B-1 100 MG PO TABS
50.0000 mg | ORAL_TABLET | Freq: Every day | ORAL | Status: DC
  Administered 2016-11-10 – 2016-11-12 (×3): 50 mg via ORAL
  Filled 2016-11-09 (×3): qty 1

## 2016-11-09 MED ORDER — ONDANSETRON HCL 4 MG PO TABS: 4.0000 mg | ORAL_TABLET | Freq: Four times a day (QID) | ORAL | Status: DC | PRN

## 2016-11-09 MED ORDER — SODIUM CHLORIDE 0.9% FLUSH: 3.0000 mL | INTRAVENOUS | Status: DC | PRN

## 2016-11-09 MED ORDER — CARVEDILOL 3.125 MG PO TABS
6.2500 mg | ORAL_TABLET | Freq: Two times a day (BID) | ORAL | Status: DC
  Administered 2016-11-09 – 2016-11-10 (×2): 6.25 mg via ORAL
  Filled 2016-11-09 (×2): qty 2

## 2016-11-09 MED ORDER — ONDANSETRON HCL 4 MG/2ML IJ SOLN
4.0000 mg | Freq: Four times a day (QID) | INTRAMUSCULAR | Status: DC | PRN
  Administered 2016-11-10 (×2): 4 mg via INTRAVENOUS
  Filled 2016-11-09 (×2): qty 2

## 2016-11-09 MED ORDER — PANTOPRAZOLE SODIUM 40 MG PO TBEC
40.0000 mg | DELAYED_RELEASE_TABLET | Freq: Every day | ORAL | Status: DC
  Administered 2016-11-10 – 2016-11-12 (×3): 40 mg via ORAL
  Filled 2016-11-09 (×3): qty 1

## 2016-11-09 MED ORDER — POTASSIUM CHLORIDE CRYS ER 20 MEQ PO TBCR
40.0000 meq | EXTENDED_RELEASE_TABLET | Freq: Once | ORAL | Status: AC
  Administered 2016-11-09: 40 meq via ORAL
  Filled 2016-11-09: qty 2

## 2016-11-09 MED ORDER — ACETAMINOPHEN 325 MG PO TABS: 650.0000 mg | ORAL_TABLET | Freq: Four times a day (QID) | ORAL | Status: DC | PRN

## 2016-11-09 MED ORDER — SODIUM CHLORIDE 0.9% FLUSH
3.0000 mL | Freq: Two times a day (BID) | INTRAVENOUS | Status: DC
  Administered 2016-11-09 – 2016-11-11 (×5): 3 mL via INTRAVENOUS

## 2016-11-09 MED ORDER — BUSPIRONE HCL 7.5 MG PO TABS: 7.5000 mg | ORAL_TABLET | Freq: Two times a day (BID) | ORAL | Status: DC

## 2016-11-09 NOTE — H&P (Signed)
History and Physical  [name] NWG:[id number] DOB: 02/26/1960 DOA: 11/09/2016  Referring physician: Francine Graven, ER physician  PCP: [name], MD  Outpatient Specialists: None Patient coming from: Home & is able to ambulate without assistance  Chief Complaint:  Shortness of breath  HPI: [name] is a 56 y.o. male with medical history significant for  chronic systolic/diastolic heart failure, atrial fibrillation and cirrhosis secondary ascites who presented to the emergency room after several days of progressively worsening dyspnea on exertion. Patient noted increased palpitations and his feet were swelling. He fed he felt like his atrial fibrillation was acting up. He decided to come in.  ED Course: in the emergency room, patient found to be in rapid atrial fibrillation requiring Cardizem bolus and drip. Also noted to have a significant right-sided pleural effusion as well as an elevated BNP of 725. Patient given a dose of Lasix and interventional radiology consulted who is able to do a thoracentesis and removed 2 L off of his right lung. However, patient continued to remain in rapid A. fib requiring Cardizem drip. Hospitalists were called for further evaluation and admission.  Review of Systems: Patient seein the emergency room . Pt complains of feeling a little anxious, he says he has not taken his morning medications. This is feels that his breathing a little bit better, still feels a little shaky   Pt denies any headaches, vision changes, dysphagia, chest pain, wheezing, cough, abdominal pain, hematuria, dysuria, constipation, diarrhea, focal extremity numbness weakness or pain .  Review of systems are otherwise negative   Past Medical History:  Diagnosis Date  . Anxiety   . Chronic back pain   . Cirrhosis ([place])   . Coronary artery disease   . DDD (degenerative disc disease), lumbosacral   . Depression   . Elevated LFTs    secondary to ETOH  . History of  alcohol abuse   . Hx of cardiac cath 1996  . Hyperlipidemia   . Hypertension   . Liver failure ([place])   . MI (myocardial infarction) ([place])   . Stroke [facility]) 2003   Past Surgical History:  Procedure Laterality Date  . CARDIAC CATHETERIZATION  1996  . COLONOSCOPY N/A 03/25/2015   Procedure: COLONOSCOPY;  Surgeon: Rogene Houston, MD;  Location: [facility];  Service: Endoscopy;  Laterality: N/A;  1130  . ESOPHAGOGASTRODUODENOSCOPY N/A 03/25/2015   Procedure: ESOPHAGOGASTRODUODENOSCOPY (EGD);  Surgeon: Rogene Houston, MD;  Location: [facility];  Service: Endoscopy;  Laterality: N/A;    Social History:  reports that he has never smoked. He quit smokeless tobacco use about 2 years ago. His smokeless tobacco use included Chew. He reports that he does not drink alcohol or use drugs.  He lives at home with his 2 girlfriends (both sisters!). He does not use a walker or cane is able to ambulate without assistance.    Allergies  Allergen Reactions  . Albumin (Human) Hives and Itching  . Bee Venom Anaphylaxis and Nausea Only  . Bee Venom Anaphylaxis, Nausea And Vomiting and Swelling    Throat swelling  . Other Hives    Pt states there was a fluid through IV he was getting while getting fluid taken off his stomach. It gave him hives    Family History  Problem Relation Age of Onset  . Heart disease Mother   . Cancer Father        throat cancer  . Cancer Brother  melanoma  . Thyroid disease Sister       Prior to Admission medications   Medication Sig Start Date End Date Taking? Authorizing Provider  busPIRone (BUSPAR) 7.5 MG tablet Take 1 tablet (7.5 mg total) by mouth 2 (two) times daily. FOR ANXIETY 07/06/16  Yes [place], Modena Nunnery, MD  CARAFATE 1 GM/10ML suspension TAKE TWO TEASPOONSFUL (10 ML) BY MOUTH FOUR TIMES DAILY - WITH MEALS AND AT BEDTIME 07/27/16  Yes [place], Modena Nunnery, MD  carvedilol (COREG) 6.25 MG tablet Take 1 tablet (6.25 mg total) by mouth 2 (two) times daily.  07/06/16  Yes [place], Modena Nunnery, MD  EPINEPHrine 0.3 mg/0.3 mL IJ SOAJ injection Inject 0.3 mLs (0.3 mg total) into the muscle once. 06/11/15  Yes [place], Modena Nunnery, MD  FLUoxetine (PROZAC) 20 MG tablet Take 1 tablet (20 mg total) by mouth daily. 07/06/16  Yes [place], Modena Nunnery, MD  furosemide (LASIX) 40 MG tablet Take 1 tablet (40 mg total) by mouth 2 (two) times daily. Patient taking differently: Take 40 mg by mouth daily.  08/05/16 11/09/16 Yes Amin, Ankit Chirag, MD  lactulose (CHRONULAC) 10 GM/15ML solution TAKE 22.5ML BY MOUTH TWICE DAILY 08/29/16  Yes [place], Modena Nunnery, MD  lisinopril (PRINIVIL,ZESTRIL) 2.5 MG tablet Take 1 tablet (2.5 mg total) by mouth daily. 07/20/16 11/09/16 Yes Tat, Shanon Brow, MD  omeprazole (PRILOSEC) 20 MG capsule Take 1 capsule (20 mg total) by mouth 2 (two) times daily. 07/06/16  Yes [place], Modena Nunnery, MD  sildenafil (VIAGRA) 100 MG tablet Take 0.5 tablets (50 mg total) by mouth daily as needed for erectile dysfunction. Take 1/2 tablet ( 50 mg ) 30 minutes prior to sexual engagement. 09/07/16  Yes BranchAlphonse Guild, MD  spironolactone (ALDACTONE) 50 MG tablet Take 1 tablet (50 mg total) by mouth daily. 08/06/16 11/09/16 Yes Amin, Jeanella Flattery, MD  thiamine (VITAMIN B-1) 50 MG tablet Take by mouth daily.   Yes [provider]    Physical Exam: BP (!) 132/91   Pulse (!) 106   Temp 98.9 F (37.2 C) (Oral)   Resp (!) 24   Ht 6\' 3"  (1.905 m)   Wt 84.4 kg (186 lb)   SpO2 98%   BMI 23.25 kg/m   General:  Alert and oriented 3, no acute distress  Eyes: sclera nonicteric, extraocular movements are intact  ENT: normocephalic and atraumatic, mucous membranes are moist  Neck: supple, mild JVD noted on left  Cardiovascular: irregular rhythm, mildly tachycardic  Respiratory: decreased breath sounds right side at base  Abdomen: firm, moderately distended with few bowel sounds  Skin: no skin breaks, tears or lesions  Musculoskeletal: no clubbing or cyanosis, 1-2 plus  pitting edema from the knees down  Psychiatric: appropriate, no evidence of psychoses  Neurologic: no focal deficits           Labs on Admission:  Basic Metabolic Panel:  Recent Labs Lab 11/09/16 1048  NA 137  K 3.1*  CL 101  CO2 22  GLUCOSE 101*  BUN 8  CREATININE 0.74  CALCIUM 8.2*   Liver Function Tests:  Recent Labs Lab 11/09/16 1048  AST 83*  ALT 26  ALKPHOS 143*  BILITOT 2.4*  PROT 6.9  ALBUMIN 2.5*   No results for input(s): LIPASE, AMYLASE in the last 168 hours. No results for input(s): AMMONIA in the last 168 hours. CBC:  Recent Labs Lab 11/09/16 1048  WBC 4.2  NEUTROABS 2.4  HGB 12.3*  HCT 35.6*  MCV 104.7*  PLT 53*   Cardiac Enzymes:  Recent Labs Lab 11/09/16 1048  TROPONINI <0.03    BNP (last 3 results)  Recent Labs  07/18/16 1845 08/03/16 2131 11/09/16 1048  BNP 236.0* 216.0* 725.0*    ProBNP (last 3 results) No results for input(s): PROBNP in the last 8760 hours.  CBG: No results for input(s): GLUCAP in the last 168 hours.  Radiological Exams on Admission: Dg Chest 1 View  Result Date: 11/09/2016 CLINICAL DATA:  RIGHT pleural effusion post thoracentesis EXAM: CHEST 1 VIEW COMPARISON:  Earlier study of 11/09/2016 FINDINGS: Expiratory technique. Accentuation of heart size and vascular markings. Residual pleural effusion and atelectasis at RIGHT lung base despite removal of 2.1 L of fluid. No pneumothorax. LEFT lung remains clear. IMPRESSION: Persistent RIGHT pleural effusion and basilar atelectasis despite removal of 2.1 L of fluid at thoracentesis. No pneumothorax following procedure. Findings discussed with patient; patient reports marked symptomatic improvement. Electronically Signed   By: Lavonia Dana M.D.   On: 11/09/2016 15:16   Dg Chest 2 View  Result Date: 11/09/2016 CLINICAL DATA:  Shortness of breath and edema for few days EXAM: CHEST  2 VIEW COMPARISON:  August 11, 2016 FINDINGS: The heart size and mediastinal contours  are stable. There is a large right pleural effusion with consolidation of right mid and lung base. The left lung is clear. Osseous structures are stable. IMPRESSION: Large right pleural effusion with consolidation of right mid and lung base. Left lung is clear. Electronically Signed   By: Abelardo Diesel M.D.   On: 11/09/2016 11:50   US Thoracentesis Asp Pleural Space W/img Guide  Result Date: 11/09/2016 INDICATION: Recurrent RIGHT pleural effusion EXAM: ULTRASOUND GUIDED THERAPEUTIC RIGHT THORACENTESIS MEDICATIONS: None. COMPLICATIONS: None immediate. PROCEDURE: Procedure, benefits, and risks of procedure were discussed with patient. Written informed consent for procedure was obtained. Time out protocol followed. Pleural effusion localized by ultrasound at the posterior RIGHT hemithorax. Skin prepped and draped in usual sterile fashion. Skin and soft tissues anesthetized with 10 mL of 1% lidocaine. 8 French thoracentesis catheter placed into the RIGHT pleural space. 2.1 L of yellow RIGHT pleural fluid aspirated by syringe pump. Procedure tolerated well by patient without immediate complication. FINDINGS: As above IMPRESSION: Successful ultrasound guided RIGHT thoracentesis yielding 2.1 L of pleural fluid. Electronically Signed   By: Lavonia Dana M.D.   On: 11/09/2016 15:14    EKG: Independently reviewed. Rapid atrial fibrillation   Assessment/Plan Present on Admission: . Essential hypertension: Stable . Anxiety and depression: Patient feels anxious, continue home medications . (Resolved) Alcohol abuse: Patient no longer drinks, has been sober now for over a year . Cirrhosis of liver with ascites ([place]): Paced . Acute on chronic combined systolic and diastolic CHF (congestive heart failure) ([place]): Principal problem. Have started diuretics. Also tapped pleural effusion. Unfortunate, chronic problem for this patient. Monitor strict input output and daily weights. Continue ACE inhibitor and beta blocker. Marland Kitchen  A-fib ([place]) with rapid ventricular rate: In the setting of hypoxia and volume overload. On Cardizem drip. We'll place in stepdown unit and try to wean off of drip. Should help following thoracentesis. . Pleural effusion, right: Recurrent issue. Status post right thoracentesis by interventional radiology. Continue Lasix.   Principal Problem:   Acute on chronic combined systolic and diastolic CHF (congestive heart failure) ([facility]) Active Problems:   Essential hypertension   Anxiety and depression   Cirrhosis of liver with ascites ([facility])   A-fib ([facility])   History of alcohol abuse  Pleural effusion, right   Ascites of liver   DVT prophylaxis: No Lovenox secondary to thrombocytopenia, SCDs   Code Status: Full code as per patient   Family Communication: Told patient I would speak to his mother who is also an inpatient   Disposition Plan: Likely will be for several days until fully diuresed   Consults called: Interventional radiology   Admission status: Given need for acute inpatient services, admit as inpatient     Annita Brod MD [facility] Pager [phone]  If 7PM-7AM, please contact night-coverage [email or web address] Password [email or web address]  11/09/2016, 5:10 PM

## 2016-11-09 NOTE — ED Notes (Signed)
Awaiting room assignment and hospitalist

## 2016-11-09 NOTE — Sedation Documentation (Signed)
Patient denies pain and is resting comfortably.  

## 2016-11-09 NOTE — ED Notes (Signed)
Call to floor for report = will return call

## 2016-11-09 NOTE — ED Notes (Signed)
Report to Crystal, RN.

## 2016-11-09 NOTE — ED Notes (Signed)
Call for report- RN off the floor

## 2016-11-09 NOTE — ED Notes (Signed)
PT to [place] for thoracentesis.
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
## 2016-11-09 NOTE — Progress Notes (Signed)
Patient arrived to ICU room #04 @ [phone]

## 2016-11-09 NOTE — Sedation Documentation (Signed)
Vital signs stable. 

## 2016-11-09 NOTE — Procedures (Signed)
PreOperative Dx: Recurrent RIGHT pleural effusion Postoperative Dx: Recurrent RIGHT pleural effusion Procedure:   US guided RIGHT thoracentesis Radiologist:  Thornton Papas Anesthesia:  10 ml of 1% lidocaine Specimen:  2.1 L of yellow colored fluid EBL:   < 1 ml Complications: None

## 2016-11-10 ENCOUNTER — Inpatient Hospital Stay (HOSPITAL_COMMUNITY): Payer: Medicare Other

## 2016-11-10 LAB — MRSA PCR SCREENING: MRSA by PCR: POSITIVE — AB

## 2016-11-10 LAB — CBC
HCT: 33.1 % — ABNORMAL LOW (ref 39.0–52.0)
HEMOGLOBIN: 11.2 g/dL — AB (ref 13.0–17.0)
MCH: 36.4 pg — ABNORMAL HIGH (ref 26.0–34.0)
MCHC: 33.8 g/dL (ref 30.0–36.0)
MCV: 107.5 fL — ABNORMAL HIGH (ref 78.0–100.0)
Platelets: 28 10*3/uL — CL (ref 150–400)
RBC: 3.08 MIL/uL — ABNORMAL LOW (ref 4.22–5.81)
RDW: 16.5 % — AB (ref 11.5–15.5)
WBC: 3.7 10*3/uL — AB (ref 4.0–10.5)

## 2016-11-10 LAB — BASIC METABOLIC PANEL
ANION GAP: 8 (ref 5–15)
BUN: 10 mg/dL (ref 6–20)
CALCIUM: 7.8 mg/dL — AB (ref 8.9–10.3)
CO2: 28 mmol/L (ref 22–32)
Chloride: 101 mmol/L (ref 101–111)
Creatinine, Ser: 0.89 mg/dL (ref 0.61–1.24)
GFR calc Af Amer: >60 mL/min (ref >60)
GLUCOSE: 97 mg/dL (ref 65–99)
Potassium: 4.2 mmol/L (ref 3.5–5.1)
SODIUM: 137 mmol/L (ref 135–145)

## 2016-11-10 LAB — MAGNESIUM: MAGNESIUM: 1.3 mg/dL — AB (ref 1.7–2.4)

## 2016-11-10 MED ORDER — CARVEDILOL 3.125 MG PO TABS
6.2500 mg | ORAL_TABLET | ORAL | Status: AC
  Administered 2016-11-10: 6.25 mg via ORAL
  Filled 2016-11-10: qty 2

## 2016-11-10 MED ORDER — MUPIROCIN 2 % EX OINT
1.0000 "application " | TOPICAL_OINTMENT | Freq: Two times a day (BID) | CUTANEOUS | Status: DC
  Administered 2016-11-10 – 2016-11-12 (×5): 1 via NASAL
  Filled 2016-11-10 (×2): qty 22

## 2016-11-10 MED ORDER — CARVEDILOL 12.5 MG PO TABS
12.5000 mg | ORAL_TABLET | Freq: Two times a day (BID) | ORAL | Status: DC
  Administered 2016-11-10 – 2016-11-11 (×2): 12.5 mg via ORAL
  Filled 2016-11-10 (×2): qty 1

## 2016-11-10 MED ORDER — CHLORHEXIDINE GLUCONATE CLOTH 2 % EX PADS
6.0000 | MEDICATED_PAD | Freq: Every day | CUTANEOUS | Status: DC
  Administered 2016-11-10 – 2016-11-12 (×3): 6 via TOPICAL

## 2016-11-10 NOTE — Care Management Note (Signed)
Case Management Note  Patient Details  Name: [name] MRN: [id number] Date of Birth: 1960-08-26  Subjective/Objective: Adm from home with acute on chronic combined systolic and diastolic heart failure. Found to be in rapid afib in ER and to have right sided pleural effusion. Thoracentesis done and 2 L removed. Patient is from home, with "2 girl friends". Reports being independent with ADL's. No home health or DME PTA. Has PCP, girl friend drives him to appointments, and reports no issues with affording medications.                Action/Plan: Anticipate DC home with self care. No CM needs known or communicated.   Expected Discharge Date:    11/12/2016              Expected Discharge Plan:  Home/Self Care  In-House Referral:     Discharge planning Services  CM Consult  Post Acute Care Choice:  NA Choice offered to:  NA  DME Arranged:    DME Agency:     HH Arranged:    HH Agency:     Status of Service:  Completed, signed off  If discussed at [employer] of Stay Meetings, dates discussed:    Additional Comments:  Xitlalli Newhard, Chauncey Reading, RN 11/10/2016, 10:53 AM

## 2016-11-10 NOTE — Progress Notes (Signed)
PROGRESS NOTE  [name] WYO:[id number] DOB: Nov 14, 1960 DOA: 11/09/2016 PCP: [name], MD  HPI/Recap of past 61 hours:  56 year old male with past mental history of chronic systolic/diastolic heart failure, atrial fibrillation and cirrhosis with secondary ascites admitted on 9/26 for A. fib with RVR felt to be secondary to acute heart failure with right-sided pleural effusion. Patient underwent right-sided thoracentesis yielding 2 L of fluid and then and placed in stepdown unit because he required a Cardizem drip. Also started on diuretics.  Overnight no issues. Cardizem drip slowly being weaned down. Patient is feeling like his breathing is getting much better.  Assessment/Plan: Principal Problem:   Acute on chronic combined systolic and diastolic CHF (congestive heart failure) ([place]): Breathing much better. Responded well to diuresis. We'll check ambulatory pulse ox Active Problems:   Essential hypertension: Holding oral antihypertensives as blood pressure has been slightly soft   Anxiety and depression: Stable.   Cirrhosis of liver with ascites ([place])   A-fib ([place]) with rapid ventricular rate: Able to be weaned off Cardizem drip. Transfer to floor. I have increased his Coreg   History of alcohol abuse: Has been sober for well over a year.   Pleural effusion, right: Status post thoracentesis. Check follow-up x-ray. Suspect that he likely will need regular scheduled thoracentesis, possible consideration for Pleurx catheter   Ascites of liver: Has had limited paracentesis given that fluid shift sleep 2 right-sided pleural effusion.   Code Status: Full code   Family Communication: Discussed with patient's mother who is also inpatient in the hospital   Disposition Plan: Anticipate discharge tomorrow breathing comfortably and stays out of rapid A. fib    Consultants:  Interventional radiology   Procedures:  Status post thoracentesis yielding 2 L on 9/26 by  interventional radiology   Antimicrobials:  None   DVT prophylaxis:  SCDs   Objective: Vitals:   11/10/16 0800 11/10/16 0809 11/10/16 1200 11/10/16 1338  BP: 98/88     Pulse: 94   71  Resp: (!) 24   (!) 25  Temp:  98.2 F (36.8 C) 98.4 F (36.9 C)   TempSrc:  Oral Oral   SpO2: 97%   96%  Weight:      Height:        Intake/Output Summary (Last 24 hours) at 11/10/16 1542 Last data filed at 11/10/16 0600  Gross per 24 hour  Intake           579.35 ml  Output             1300 ml  Net          -720.65 ml   Filed Weights   11/09/16 1043 11/10/16 0500  Weight: 84.4 kg (186 lb) 90.4 kg (199 lb 4.7 oz)    Exam:   General:  Alert and oriented 3, no acute distress  HEENT: Normocephalic atraumatic when because women's are moist  Neck: Supple, no JVD   Cardiovascular: Irregular rhythm, rate controlled   Respiratory: Decreased breath sounds on right side from mid way down   Abdomen: Soft, distended, nontender, hypoactive bowel sounds   Musculoskeletal: No clubbing or cyanosis, trace pitting edema bilaterally   Skin: No skin breaks, tears or lesions  Neuro: No focal deficits  Psychiatry: Patient is appropriate, no evidence of psychoses    Data Reviewed: CBC:  Recent Labs Lab 11/09/16 1048 11/10/16 0405  WBC 4.2 3.7*  NEUTROABS 2.4  --   HGB 12.3* 11.2*  HCT 35.6* 33.1*  MCV 104.7* 107.5*  PLT 53* 28*   Basic Metabolic Panel:  Recent Labs Lab 11/09/16 1048 11/10/16 0405  NA 137 137  K 3.1* 4.2  CL 101 101  CO2 22 28  GLUCOSE 101* 97  BUN 8 10  CREATININE 0.74 0.89  CALCIUM 8.2* 7.8*  MG  --  1.3*   GFR: Estimated Creatinine Clearance: 109.4 mL/min (by C-G formula based on SCr of 0.89 mg/dL). Liver Function Tests:  Recent Labs Lab 11/09/16 1048  AST 83*  ALT 26  ALKPHOS 143*  BILITOT 2.4*  PROT 6.9  ALBUMIN 2.5*   No results for input(s): LIPASE, AMYLASE in the last 168 hours. No results for input(s): AMMONIA in the last 168  hours. Coagulation Profile: No results for input(s): INR, PROTIME in the last 168 hours. Cardiac Enzymes:  Recent Labs Lab 11/09/16 1048  TROPONINI <0.03   BNP (last 3 results) No results for input(s): PROBNP in the last 8760 hours. HbA1C: No results for input(s): HGBA1C in the last 72 hours. CBG: No results for input(s): GLUCAP in the last 168 hours. Lipid Profile: No results for input(s): CHOL, HDL, LDLCALC, TRIG, CHOLHDL, LDLDIRECT in the last 72 hours. Thyroid Function Tests: No results for input(s): TSH, T4TOTAL, FREET4, T3FREE, THYROIDAB in the last 72 hours. Anemia Panel: No results for input(s): VITAMINB12, FOLATE, FERRITIN, TIBC, IRON, RETICCTPCT in the last 72 hours. Urine analysis:    Component Value Date/Time   COLORURINE AMBER (A) 12/14/2015 1439   APPEARANCEUR CLEAR 12/14/2015 1439   LABSPEC 1.015 12/14/2015 1439   PHURINE 6.0 12/14/2015 1439   GLUCOSEU TRACE (A) 12/14/2015 1439   HGBUR 3+ (A) 12/14/2015 1439   BILIRUBINUR NEGATIVE 12/14/2015 1439   KETONESUR TRACE (A) 12/14/2015 1439   PROTEINUR 1+ (A) 12/14/2015 1439   UROBILINOGEN 1.0 07/26/2014 2231   NITRITE NEGATIVE 12/14/2015 1439   LEUKOCYTESUR NEGATIVE 12/14/2015 1439   Sepsis Labs: @LABRCNTIP (procalcitonin:4,lacticidven:4)  ) Recent Results (from the past 240 hour(s))  MRSA PCR Screening     Status: Abnormal   Collection Time: 11/09/16  6:17 PM  Result Value Ref Range Status   MRSA by PCR POSITIVE (A) NEGATIVE Final    Comment:        The GeneXpert MRSA Assay (FDA approved for NASAL specimens only), is one component of a comprehensive MRSA colonization surveillance program. It is not intended to diagnose MRSA infection nor to guide or monitor treatment for MRSA infections. RESULT CALLED TO, READ BACK BY AND VERIFIED WITH: MCDANIEL M. AT [id number] ON [id number] BY THOMPSON S.       Studies: No results found.  Scheduled Meds: . busPIRone  7.5 mg Oral BID  . carvedilol  12.5 mg Oral BID   . Chlorhexidine Gluconate Cloth  6 each Topical Q0600  . FLUoxetine  20 mg Oral Daily  . furosemide  20 mg Intravenous Q12H  . lactulose  20 g Oral BID  . lisinopril  2.5 mg Oral Daily  . mupirocin ointment  1 application Nasal BID  . pantoprazole  40 mg Oral Daily  . sodium chloride flush  3 mL Intravenous Q12H  . thiamine  50 mg Oral Daily    Continuous Infusions: . sodium chloride    . diltiazem (CARDIZEM) infusion 5 mg/hr (11/10/16 1414)     LOS: 1 day     Annita Brod, MD [facility] Pager [phone]  If 7PM-7AM, please contact night-coverage [email or web address] Password [facility] 11/10/2016, 3:42 PM

## 2016-11-10 NOTE — Progress Notes (Signed)
Lab called critical platelets of 28. At [phone]. Dr. Olevia Bowens paged at [phone].

## 2016-11-10 NOTE — Care Management Important Message (Signed)
Important Message  Patient Details  Name: [name] MRN: [id number] Date of Birth: [birth year]   Medicare Important Message Given:  Yes    Deyton Ellenbecker, Chauncey Reading, RN 11/10/2016, 10:59 AM

## 2016-11-10 NOTE — Progress Notes (Signed)
Patient transferred to dept 300 room# 332, report given to receiving nurse Audrea Muscat, RN.

## 2016-11-11 DIAGNOSIS — I5043 Acute on chronic combined systolic (congestive) and diastolic (congestive) heart failure: Secondary | ICD-10-CM

## 2016-11-11 DIAGNOSIS — I9589 Other hypotension: Secondary | ICD-10-CM

## 2016-11-11 DIAGNOSIS — K7460 Unspecified cirrhosis of liver: Secondary | ICD-10-CM

## 2016-11-11 DIAGNOSIS — I4891 Unspecified atrial fibrillation: Secondary | ICD-10-CM

## 2016-11-11 DIAGNOSIS — J90 Pleural effusion, not elsewhere classified: Secondary | ICD-10-CM

## 2016-11-11 DIAGNOSIS — Z9889 Other specified postprocedural states: Secondary | ICD-10-CM

## 2016-11-11 DIAGNOSIS — D696 Thrombocytopenia, unspecified: Secondary | ICD-10-CM | POA: Diagnosis present

## 2016-11-11 LAB — CBC
HCT: 36.5 % — ABNORMAL LOW (ref 39.0–52.0)
Hemoglobin: 12.2 g/dL — ABNORMAL LOW (ref 13.0–17.0)
MCH: 35.9 pg — AB (ref 26.0–34.0)
MCHC: 33.4 g/dL (ref 30.0–36.0)
MCV: 107.4 fL — ABNORMAL HIGH (ref 78.0–100.0)
PLATELETS: 31 10*3/uL — AB (ref 150–400)
RBC: 3.4 MIL/uL — ABNORMAL LOW (ref 4.22–5.81)
RDW: 16.2 % — ABNORMAL HIGH (ref 11.5–15.5)
WBC: 3.6 10*3/uL — ABNORMAL LOW (ref 4.0–10.5)

## 2016-11-11 LAB — BASIC METABOLIC PANEL
Anion gap: 8 (ref 5–15)
BUN: 15 mg/dL (ref 6–20)
CALCIUM: 7.8 mg/dL — AB (ref 8.9–10.3)
CO2: 27 mmol/L (ref 22–32)
CREATININE: 1.16 mg/dL (ref 0.61–1.24)
Chloride: 101 mmol/L (ref 101–111)
GFR calc Af Amer: >60 mL/min (ref >60)
GFR calc non Af Amer: >60 mL/min (ref >60)
GLUCOSE: 90 mg/dL (ref 65–99)
Potassium: 4.1 mmol/L (ref 3.5–5.1)
Sodium: 136 mmol/L (ref 135–145)

## 2016-11-11 LAB — TSH: TSH: 0.899 u[IU]/mL (ref 0.350–4.500)

## 2016-11-11 LAB — VITAMIN B12: Vitamin B-12: 791 pg/mL (ref 180–914)

## 2016-11-11 MED ORDER — METOPROLOL TARTRATE 5 MG/5ML IV SOLN: 5.0000 mg | Freq: Four times a day (QID) | INTRAVENOUS | Status: DC | PRN

## 2016-11-11 MED ORDER — SODIUM CHLORIDE 0.9 % IV BOLUS (SEPSIS)
250.0000 mL | Freq: Once | INTRAVENOUS | Status: AC
  Administered 2016-11-11: 250 mL via INTRAVENOUS

## 2016-11-11 NOTE — Progress Notes (Signed)
PROGRESS NOTE    ROHIN KREJCI  WUJ:[id number] DOB: July 20, 1960 DOA: 11/09/2016 PCP: [name], MD    Brief Narrative:  [name] is a 56 y.o. male with medical history significant for chronic systolic/diastolic heart failure, atrial fibrillation and cirrhosis with ascites who presented to the emergency room after several days of progressively worsening dyspnea on exertion. Patient noted increased palpitations and his feet were swelling. He felt like his atrial fibrillation was acting up. He decided to come in. In the ED, he was found to be in rapid atrial fibrillation requiring Cardizem bolus and drip. He was also noted to have a significant right-sided pleural effusion as well as an elevated BNP of 725. Patient was given a dose of Lasix and interventional radiology was consulted to do a thoracentesis.  2 L was removed off of his right lung. However, he continued to remain in rapid A. fib. He was admitted for further evaluation and management.   Assessment & Plan:   Principal Problem:   Acute on chronic combined systolic and diastolic CHF (congestive heart failure) (HCC) Active Problems:   Atrial fibrillation with RVR (HCC)   Pleural effusion, right   Cirrhosis of liver with ascites (HCC)   Essential hypertension   Anxiety and depression   Ventral hernia   Hypotension   History of alcohol abuse   Thrombocytopenia ([place])   1. Acute on chronic combined systolic and diastolic heart failure. Patient is treated chronically with carvedilol, Lasix, lisinopril, and spironolactone. -He was started on IV Lasix, carvedilol, and lisinopril. -He has diuresed -816 cc +2 L taken off via thoracentesis. -He is symptomatically improved, but his blood pressures have decreased to the hypotensive range. Therefore, will hold Lasix, carvedilol, and lisinopril.  Recurrent right pleural effusion. Apparently this is not new. It is suspected that the pleural effusion is a transudate from his  cirrhosis. -He is status post thoracentesis on 9/26 yielding 2 L of fluid. -He may benefit from a Pleurx catheter.  Hypotension. This afternoon on 9/28, the patient's systolic blood pressure fell to the 70s and 80s. This is likely from diuresis and his chronic cardiac medications in the setting of cirrhosis. He is asymptomatic. -We'll hold his cardiac medications as stated above. We'll give him a 250 cc bolus of normal saline and follow. -Change vital signs every 4 hours.  Atrial fibrillation with RVR on admission. -Patient was started on a diltiazem drip. It was weaned off. -Coreg was restarted, but at a higher dose. His rate is controlled, but he is hypotensive. We'll hold Coreg for now. -We will when necessary metoprolol if his blood pressure support sit for a heart rate greater than 956 systolically.  Liver cirrhosis with ascites presumably secondary to alcohol abuse. Lactulose was continued. He was restarted on thiamine. He reported being abstinent of alcohol for one year. -His liver enzymes are mildly elevated. -No signs of hepatic encephalopathy. -We will order PT/INR.  Thrombocytopenia. Per chart review, patient's baseline platelet count ranges from 75-116. It was 53 on admission. -His platelet count has fallen to a nadir of 28. It is 50 currently. He denies any gross bleeding. -Although his thrombocytopenia is likely from cirrhosis/splenomegaly, will order PT/INR, vitamin B12, and TSH for further evaluation.     DVT prophylaxis: SCDs Code Status: Full code Family Communication: Discussed with his mother Disposition Plan: Discharged home in clinically appropriate, likely in the next 24 hours.   Consultants:   Interventional radiology  Procedures:   Status post thoracentesis  yielding 2 L on 9/26 by interventional radiology   Antimicrobials:   None    Subjective: Patient denies chest pain, shortness of breath, or abdominal pain. He denies dizziness or headache.  He denies bloody stools or hematemesis.  Objective: Vitals:   11/10/16 1951 11/11/16 0634 11/11/16 1300 11/11/16 1319  BP: (!) 89/58 (!) 100/54 (!) 72/51 (!) 82/58  Pulse: 78 70 81   Resp: 17 18 19    Temp: 98.1 F (36.7 C) 98 F (36.7 C) 97.9 F (36.6 C)   TempSrc: Oral Oral Oral   SpO2: 97% 95% 98%   Weight:  90.3 kg (199 lb 1.6 oz)    Height:        Intake/Output Summary (Last 24 hours) at 11/11/16 1442 Last data filed at 11/11/16 1300  Gross per 24 hour  Intake             1555 ml  Output             1650 ml  Net              -95 ml   Filed Weights   11/09/16 1043 11/10/16 0500 11/11/16 0634  Weight: 84.4 kg (186 lb) 90.4 kg (199 lb 4.7 oz) 90.3 kg (199 lb 1.6 oz)    Examination:  General exam: Appears calm and comfortable  Respiratory system: Clear to auscultation-Decreased breath sounds on the right. Respiratory effort normal. Cardiovascular system: Irregular, irregular.  Trace to 1+ bilateral pedal edema. Gastrointestinal system: Abdomen with mild fluid wave/mildly distended, soft and nontender. Prominent umbilical hernia, nontender. No organomegaly or masses felt. Normal bowel sounds heard. Central nervous system: Alert and oriented. No focal neurological deficits. Extremities: Symmetric 5 x 5 power. Skin: No rashes, lesions or ulcers Psychiatry: Judgement and insight appear normal. Mood & affect appropriate.     Data Reviewed: I have personally reviewed following labs and imaging studies  CBC:  Recent Labs Lab 11/09/16 1048 11/10/16 0405 11/11/16 0621  WBC 4.2 3.7* 3.6*  NEUTROABS 2.4  --   --   HGB 12.3* 11.2* 12.2*  HCT 35.6* 33.1* 36.5*  MCV 104.7* 107.5* 107.4*  PLT 53* 28* 31*   Basic Metabolic Panel:  Recent Labs Lab 11/09/16 1048 11/10/16 0405 11/11/16 0621  NA 137 137 136  K 3.1* 4.2 4.1  CL 101 101 101  CO2 22 28 27   GLUCOSE 101* 97 90  BUN 8 10 15   CREATININE 0.74 0.89 1.16  CALCIUM 8.2* 7.8* 7.8*  MG  --  1.3*  --     GFR: Estimated Creatinine Clearance: 84 mL/min (by C-G formula based on SCr of 1.16 mg/dL). Liver Function Tests:  Recent Labs Lab 11/09/16 1048  AST 83*  ALT 26  ALKPHOS 143*  BILITOT 2.4*  PROT 6.9  ALBUMIN 2.5*   No results for input(s): LIPASE, AMYLASE in the last 168 hours. No results for input(s): AMMONIA in the last 168 hours. Coagulation Profile: No results for input(s): INR, PROTIME in the last 168 hours. Cardiac Enzymes:  Recent Labs Lab 11/09/16 1048  TROPONINI <0.03   BNP (last 3 results) No results for input(s): PROBNP in the last 8760 hours. HbA1C: No results for input(s): HGBA1C in the last 72 hours. CBG: No results for input(s): GLUCAP in the last 168 hours. Lipid Profile: No results for input(s): CHOL, HDL, LDLCALC, TRIG, CHOLHDL, LDLDIRECT in the last 72 hours. Thyroid Function Tests: No results for input(s): TSH, T4TOTAL, FREET4, T3FREE, THYROIDAB in the last 72  hours. Anemia Panel: No results for input(s): VITAMINB12, FOLATE, FERRITIN, TIBC, IRON, RETICCTPCT in the last 72 hours. Sepsis Labs: No results for input(s): PROCALCITON, LATICACIDVEN in the last 168 hours.  Recent Results (from the past 240 hour(s))  MRSA PCR Screening     Status: Abnormal   Collection Time: 11/09/16  6:17 PM  Result Value Ref Range Status   MRSA by PCR POSITIVE (A) NEGATIVE Final    Comment:        The GeneXpert MRSA Assay (FDA approved for NASAL specimens only), is one component of a comprehensive MRSA colonization surveillance program. It is not intended to diagnose MRSA infection nor to guide or monitor treatment for MRSA infections. RESULT CALLED TO, READ BACK BY AND VERIFIED WITH: MCDANIEL M. AT [id number] ON [id number] BY THOMPSON S.          Radiology Studies: Dg Chest 1 View  Result Date: 11/09/2016 CLINICAL DATA:  RIGHT pleural effusion post thoracentesis EXAM: CHEST 1 VIEW COMPARISON:  Earlier study of 11/09/2016 FINDINGS: Expiratory technique.  Accentuation of heart size and vascular markings. Residual pleural effusion and atelectasis at RIGHT lung base despite removal of 2.1 L of fluid. No pneumothorax. LEFT lung remains clear. IMPRESSION: Persistent RIGHT pleural effusion and basilar atelectasis despite removal of 2.1 L of fluid at thoracentesis. No pneumothorax following procedure. Findings discussed with patient; patient reports marked symptomatic improvement. Electronically Signed   By: Lavonia Dana M.D.   On: 11/09/2016 15:16   Dg Chest 2 View  Result Date: 11/10/2016 CLINICAL DATA:  Status post thoracentesis yesterday for right pleural effusion. History of CHF, ascites, coronary artery disease with previous MI. EXAM: CHEST  2 VIEW COMPARISON:  Chest x-ray of November 09, 2016 FINDINGS: There remains a large right pleural effusion. The volume has not appreciably changed since the post thoracentesis radiograph of yesterday. There is no pneumothorax. There is persistent increased density just above the right pleural effusion. The left lung is clear. The left heart border is normal. The pulmonary vascularity is not clearly engorged. IMPRESSION: Persistent large right pleural effusion. Probable right mid and lower lung atelectasis or pneumonia. No pneumothorax or mediastinal shift. Electronically Signed   By: David  Martinique M.D.   On: 11/10/2016 16:40        Scheduled Meds: . busPIRone  7.5 mg Oral BID  . carvedilol  12.5 mg Oral BID  . Chlorhexidine Gluconate Cloth  6 each Topical Q0600  . FLUoxetine  20 mg Oral Daily  . furosemide  20 mg Intravenous Q12H  . lactulose  20 g Oral BID  . lisinopril  2.5 mg Oral Daily  . mupirocin ointment  1 application Nasal BID  . pantoprazole  40 mg Oral Daily  . sodium chloride flush  3 mL Intravenous Q12H  . thiamine  50 mg Oral Daily   Continuous Infusions: . sodium chloride    . diltiazem (CARDIZEM) infusion Stopped (11/10/16 1445)     LOS: 2 days    Time spent: 65  minutes    Rexene Alberts, MD [facility] Pager [phone]  If 7PM-7AM, please contact night-coverage [email or web address] Password [email or web address] 11/11/2016, 2:42 PM

## 2016-11-11 NOTE — Progress Notes (Signed)
Writer recvd call from tech stating that pt BP reading 70's/50's per dinamap. Writer manually checked pt BP, it was 82/58 and HR 58. Writer paged attending and revcd call back with verbal order to hold Lasix and she will put new orders in.

## 2016-11-12 DIAGNOSIS — I952 Hypotension due to drugs: Secondary | ICD-10-CM

## 2016-11-12 DIAGNOSIS — I10 Essential (primary) hypertension: Secondary | ICD-10-CM

## 2016-11-12 LAB — CBC WITH DIFFERENTIAL/PLATELET
BASOS PCT: 0 %
Basophils Absolute: 0 10*3/uL (ref 0.0–0.1)
EOS ABS: 0.1 10*3/uL (ref 0.0–0.7)
EOS PCT: 3 %
HCT: 38.4 % — ABNORMAL LOW (ref 39.0–52.0)
Hemoglobin: 12.9 g/dL — ABNORMAL LOW (ref 13.0–17.0)
LYMPHS ABS: 1.3 10*3/uL (ref 0.7–4.0)
Lymphocytes Relative: 33 %
MCH: 36.2 pg — AB (ref 26.0–34.0)
MCHC: 33.6 g/dL (ref 30.0–36.0)
MCV: 107.9 fL — ABNORMAL HIGH (ref 78.0–100.0)
MONO ABS: 0.4 10*3/uL (ref 0.1–1.0)
Monocytes Relative: 9 %
NEUTROS ABS: 2.2 10*3/uL (ref 1.7–7.7)
NEUTROS PCT: 55 %
Platelets: 38 10*3/uL — ABNORMAL LOW (ref 150–400)
RBC: 3.56 MIL/uL — ABNORMAL LOW (ref 4.22–5.81)
RDW: 15.9 % — AB (ref 11.5–15.5)
WBC: 4 10*3/uL (ref 4.0–10.5)

## 2016-11-12 LAB — PROTIME-INR
INR: 1.44
Prothrombin Time: 17.4 seconds — ABNORMAL HIGH (ref 11.4–15.2)

## 2016-11-12 LAB — BASIC METABOLIC PANEL
Anion gap: 7 (ref 5–15)
BUN: 15 mg/dL (ref 6–20)
CHLORIDE: 98 mmol/L — AB (ref 101–111)
CO2: 29 mmol/L (ref 22–32)
Calcium: 7.6 mg/dL — ABNORMAL LOW (ref 8.9–10.3)
Creatinine, Ser: 1.13 mg/dL (ref 0.61–1.24)
GFR calc Af Amer: >60 mL/min (ref >60)
GFR calc non Af Amer: >60 mL/min (ref >60)
GLUCOSE: 110 mg/dL — AB (ref 65–99)
POTASSIUM: 3.9 mmol/L (ref 3.5–5.1)
Sodium: 134 mmol/L — ABNORMAL LOW (ref 135–145)

## 2016-11-12 MED ORDER — FUROSEMIDE 40 MG PO TABS: 20.0000 mg | ORAL_TABLET | Freq: Two times a day (BID) | ORAL | Status: DC

## 2016-11-12 MED ORDER — SPIRONOLACTONE 50 MG PO TABS: 25.0000 mg | ORAL_TABLET | Freq: Every day | ORAL | 2 refills | Status: DC

## 2016-11-12 NOTE — Progress Notes (Signed)
[place] discharged Home per MD order.  Discharge instructions reviewed and discussed with the patient, all questions and concerns answered. Copy of instructions and scripts given to patient.  Allergies as of 11/12/2016      Reactions   Albumin (human) Hives, Itching   Bee Venom Anaphylaxis, Nausea Only   Bee Venom Anaphylaxis, Nausea And Vomiting, Swelling   Throat swelling   Other Hives   Pt states there was a fluid through IV he was getting while getting fluid taken off his stomach. It gave him hives      Medication List    STOP taking these medications   lisinopril 2.5 MG tablet Commonly known as:  PRINIVIL,ZESTRIL     TAKE these medications   busPIRone 7.5 MG tablet Commonly known as:  BUSPAR Take 1 tablet (7.5 mg total) by mouth 2 (two) times daily. FOR ANXIETY   CARAFATE 1 GM/10ML suspension Generic drug:  sucralfate TAKE TWO TEASPOONSFUL (10 ML) BY MOUTH FOUR TIMES DAILY - WITH MEALS AND AT BEDTIME   carvedilol 6.25 MG tablet Commonly known as:  COREG Take 1 tablet (6.25 mg total) by mouth 2 (two) times daily.   EPINEPHrine 0.3 mg/0.3 mL Soaj injection Commonly known as:  EPI-PEN Inject 0.3 mLs (0.3 mg total) into the muscle once.   FLUoxetine 20 MG tablet Commonly known as:  PROZAC Take 1 tablet (20 mg total) by mouth daily.   furosemide 40 MG tablet Commonly known as:  LASIX Take 0.5 tablets (20 mg total) by mouth 2 (two) times daily. What changed:  how much to take   lactulose 10 GM/15ML solution Commonly known as:  CHRONULAC TAKE 22.5ML BY MOUTH TWICE DAILY   omeprazole 20 MG capsule Commonly known as:  PRILOSEC Take 1 capsule (20 mg total) by mouth 2 (two) times daily.   sildenafil 100 MG tablet Commonly known as:  VIAGRA Take 0.5 tablets (50 mg total) by mouth daily as needed for erectile dysfunction. Take 1/2 tablet ( 50 mg ) 30 minutes prior to sexual engagement.   spironolactone 50 MG tablet Commonly known as:  ALDACTONE Take 0.5  tablets (25 mg total) by mouth daily. What changed:  how much to take   thiamine 50 MG tablet Commonly known as:  VITAMIN B-1 Take by mouth daily.            Discharge Care Instructions        Start     Ordered   11/12/16 0000  furosemide (LASIX) 40 MG tablet  2 times daily     11/12/16 1049   11/12/16 0000  spironolactone (ALDACTONE) 50 MG tablet  Daily     11/12/16 1049   11/12/16 0000  Increase activity slowly     11/12/16 1056   11/12/16 0000  Diet - low sodium heart healthy     11/12/16 1056   11/12/16 0000  Discharge instructions    Comments:  Follow-up with your doctors within the next week or 2. Some of your medications have changed as noted on your discharge medication list.   11/12/16 1056       IV site discontinued and catheter remains intact. Site without signs and symptoms of complications. Dressing and pressure applied.  Patient escorted to car by NT in a wheelchair,  no distress noted upon discharge.  Ralene Muskrat Annaleigha Woo 11/12/2016 1:15 PM

## 2016-11-12 NOTE — Discharge Summary (Signed)
Physician Discharge Summary  [name]:[id number] DOB: [birth year] DOA: 11/09/2016  PCP: [name], MD  Admit date: 11/09/2016 Discharge date: 11/12/2016  Time spent: Greater than 30 minutes  Recommendations for Outpatient Follow-up:  1. Recommend follow-up with GI and/or a pulmonary to discuss need for scheduled thoracentesis and potential Pleurx catheter.  2. Recommend follow-up of the patient's blood pressure and heart rate. Blood pressures were low-normal during the hospital course, so lisinopril was held and Lasix answer lactone was decreased by half.  3. Recommend follow-up of thrombocytopenia.   Discharge Diagnoses:  Principal Problem:   Acute on chronic combined systolic and diastolic CHF (congestive heart failure) (HCC) Active Problems:   Atrial fibrillation with RVR (HCC)   Pleural effusion, right   Cirrhosis of liver with ascites (HCC)   Essential hypertension   Anxiety and depression   Ventral hernia   Hypotension   History of alcohol abuse   Thrombocytopenia ([place])   Discharge Condition: Improved and stable.  Diet recommendation: Heart healthy  Filed Weights   11/10/16 0500 11/11/16 0634 11/12/16 0500  Weight: 90.4 kg (199 lb 4.7 oz) 90.3 kg (199 lb 1.6 oz) 92.3 kg (203 lb 8 oz)    History of present illness:  [name] a 56 y.o.malewith medical history significant for chronic systolic/diastolic heart failure, atrial fibrillation and cirrhosis with ascites who presented to the emergency room after several days of progressively worsening dyspnea on exertion. Patient noted increased palpitations and his feet were swelling. He felt like his atrial fibrillation was acting up. He decided to come in. In the ED, he was found to be in rapid atrial fibrillation requiring Cardizem bolus and drip. He was also noted to have a significant right-sided pleural effusion as well as an elevated BNP of 725. Patient was given a dose of Lasix and  interventional radiology was consulted to do a thoracentesis.  2 L was removed off of his right lung. However, he continued to remain in rapid A. fib. He was admitted for further evaluation and management.   Hospital Course:   1. Acute on chronic combined systolic and diastolic heart failure. Patient is treated chronically with carvedilol, Lasix, lisinopril, and spironolactone. He was noted to have an EF of 40-45% and indeterminate grade diastolic dysfunction per echo 05/2016. -He was started on IV Lasix, carvedilol, and lisinopril. Spironolactone was held. -He had diuresed -816 cc +2 L taken off via thoracentesis, but due to his systolic blood pressure falling to the 70s and 80s, he was given a 250 cc bolus and his medications were held. -Thus his I/O was -216 cc (likely more) +2 L taken off via thoracentesis. -Patient improved symptomatically and had less peripheral edema on exam. -Treatment was a times somewhat limited by his episodic hypotension. At the time of discharge, he was instructed to hold the lisinopril and to decrease the dose of Lasix and spironolactone by half. -He will need close follow-up of his pulmonary and cardiac status.  Recurrent right pleural effusion. Apparently this is not new. The recurrent pleural effusion was shown to be transudative in the past from his CHF and cirrhosis. -He underwent thoracentesis on 9/26 yielding 2 L of fluid. -He may benefit from a Pleurx catheter. He had been referred to pulmonology in the past. He was given Dr. Luan Pulling office information. He was also advised to follow-up with GI which could also arrange schedule thoracentesis/paracentesis.  Hypotension. This afternoon on 9/28, the patient's systolic blood pressure fell to the 70s  and 80s. This was likely from diuresis and his chronic cardiac medications in the setting of cirrhosis. He was slightly symptomatic. Per cardiology notes, the patient has had chronic soft blood pressures. -His  cardiac medications were temporarily held and he was given a 250 cc bolus of normal saline. His blood pressure improved. -Changes in his chronic medications were dictated above.  Atrial fibrillation with RVR on admission. -Patient was started on a diltiazem drip. It was weaned off when his heart rate improved. -Coreg was restarted, but at a higher dose, but he is hypotensive. -Upon discharge, the patient was instructed to restart Coreg at 6.25 mg twice a day, his prehospital dose. -Patient is not on antiplatelet or anticoagulation due to a history of severe GI bleeding and cirrhosis and presumably thrombocytopenia.  Liver cirrhosis with ascites presumably secondary to alcohol abuse. Lactulose was continued. He was restarted on thiamine. He reported being abstinent of alcohol for one year. -His liver enzymes were mildly elevated. -There were no signs of hepatic encephalopathy. -His PT was 17.4 and INR 1.44.  Thrombocytopenia. Per chart review, patient's baseline platelet count ranges from 75-116. It was 53 on admission. -His platelet count fell to a nadir of 28. It improved to 38 at the time of discharge. -Although his thrombocytopenia was likely from cirrhosis/splenomegaly, a number of studies were ordered for evaluation. His TSH and vitamin B12 were within normal limits.    Procedures:  Status post thoracentesis yielding 2 L on 9/26 by interventional radiology  Consultations:  Interventional radiology  Discharge Exam: Vitals:   11/12/16 0049 11/12/16 0449  BP: 103/70 98/64  Pulse: 91 92  Resp: 18 18  Temp: 98.4 F (36.9 C) 98.2 F (36.8 C)  SpO2: 97% 96%   General exam: Appears calm and comfortable  Respiratory system: Clear to auscultation-Decreased breath sounds on the right. Respiratory effort normal. Cardiovascular system: Irregular, irregular.  Trace to 1+ bilateral pedal edema. Gastrointestinal system: Abdomen with mild fluid wave/mildly distended, soft and  nontender. Prominent ventral hernia, nontender. No organomegaly or masses felt. Normal bowel sounds heard. Central nervous system: Alert and oriented. No focal neurological deficits.    Discharge Instructions   Discharge Instructions    Diet - low sodium heart healthy    Complete by:  As directed    Discharge instructions    Complete by:  As directed    Follow-up with your doctors within the next week or 2. Some of your medications have changed as noted on your discharge medication list.   Increase activity slowly    Complete by:  As directed      Current Discharge Medication List    CONTINUE these medications which have CHANGED   Details  furosemide (LASIX) 40 MG tablet Take 0.5 tablets (20 mg total) by mouth 2 (two) times daily.    spironolactone (ALDACTONE) 50 MG tablet Take 0.5 tablets (25 mg total) by mouth daily. Qty: 15 tablet, Refills: 2      CONTINUE these medications which have NOT CHANGED   Details  busPIRone (BUSPAR) 7.5 MG tablet Take 1 tablet (7.5 mg total) by mouth 2 (two) times daily. FOR ANXIETY Qty: 60 tablet, Refills: 3    CARAFATE 1 GM/10ML suspension TAKE TWO TEASPOONSFUL (10 ML) BY MOUTH FOUR TIMES DAILY - WITH MEALS AND AT BEDTIME Qty: 420 mL, Refills: 1    carvedilol (COREG) 6.25 MG tablet Take 1 tablet (6.25 mg total) by mouth 2 (two) times daily. Qty: 180 tablet, Refills: 3  EPINEPHrine 0.3 mg/0.3 mL IJ SOAJ injection Inject 0.3 mLs (0.3 mg total) into the muscle once. Qty: 1 Device, Refills: 1    FLUoxetine (PROZAC) 20 MG tablet Take 1 tablet (20 mg total) by mouth daily. Qty: 30 tablet, Refills: 3    lactulose (CHRONULAC) 10 GM/15ML solution TAKE 22.5ML BY MOUTH TWICE DAILY Qty: 473 mL, Refills: 3    omeprazole (PRILOSEC) 20 MG capsule Take 1 capsule (20 mg total) by mouth 2 (two) times daily. Qty: 60 capsule, Refills: 3    sildenafil (VIAGRA) 100 MG tablet Take 0.5 tablets (50 mg total) by mouth daily as needed for erectile  dysfunction. Take 1/2 tablet ( 50 mg ) 30 minutes prior to sexual engagement. Qty: 4 tablet, Refills: 11    thiamine (VITAMIN B-1) 50 MG tablet Take by mouth daily.      STOP taking these medications     lisinopril (PRINIVIL,ZESTRIL) 2.5 MG tablet        Allergies  Allergen Reactions  . Albumin (Human) Hives and Itching  . Bee Venom Anaphylaxis and Nausea Only  . Bee Venom Anaphylaxis, Nausea And Vomiting and Swelling    Throat swelling  . Other Hives    Pt states there was a fluid through IV he was getting while getting fluid taken off his stomach. It gave him hives   Follow-up Information    Setzer, Rona Ravens, NP Follow up.   Specialty:  Internal Medicine Why:  Follow-up as soon as possible to discuss scheduling drainage of fluid from your lungs and abdomen. Contact information: [address], [address] [place] [address] [phone]        Sinda Du, MD Follow up.   Specialty:  Pulmonary Disease Why:  Make appointment with Dr. Donley Redder doctor as soon as possible to discuss treatment of recurrent fluid buildup in your lungs. Contact information: [place] [address] [phone]        [name], MD. Schedule an appointment as soon as possible for a visit in 2 week(s).   Specialty:  Family Medicine Contact information: [address] [place] [address] [phone]        Arnoldo Lenis, MD Follow up in 1 week(s).   Specialty:  Cardiology Contact information: [address] [place] [address] [phone]            The results of significant diagnostics from this hospitalization (including imaging, microbiology, ancillary and laboratory) are listed below for reference.    Significant Diagnostic Studies: Dg Chest 1 View  Result Date: 11/09/2016 CLINICAL DATA:  RIGHT pleural effusion post thoracentesis EXAM: CHEST 1 VIEW COMPARISON:  Earlier study of 11/09/2016 FINDINGS:  Expiratory technique. Accentuation of heart size and vascular markings. Residual pleural effusion and atelectasis at RIGHT lung base despite removal of 2.1 L of fluid. No pneumothorax. LEFT lung remains clear. IMPRESSION: Persistent RIGHT pleural effusion and basilar atelectasis despite removal of 2.1 L of fluid at thoracentesis. No pneumothorax following procedure. Findings discussed with patient; patient reports marked symptomatic improvement. Electronically Signed   By: Lavonia Dana M.D.   On: 11/09/2016 15:16   Dg Chest 2 View  Result Date: 11/10/2016 CLINICAL DATA:  Status post thoracentesis yesterday for right pleural effusion. History of CHF, ascites, coronary artery disease with previous MI. EXAM: CHEST  2 VIEW COMPARISON:  Chest x-ray of November 09, 2016 FINDINGS: There remains a large right pleural effusion. The volume has not appreciably  changed since the post thoracentesis radiograph of yesterday. There is no pneumothorax. There is persistent increased density just above the right pleural effusion. The left lung is clear. The left heart border is normal. The pulmonary vascularity is not clearly engorged. IMPRESSION: Persistent large right pleural effusion. Probable right mid and lower lung atelectasis or pneumonia. No pneumothorax or mediastinal shift. Electronically Signed   By: David  Martinique M.D.   On: 11/10/2016 16:40   Dg Chest 2 View  Result Date: 11/09/2016 CLINICAL DATA:  Shortness of breath and edema for few days EXAM: CHEST  2 VIEW COMPARISON:  August 11, 2016 FINDINGS: The heart size and mediastinal contours are stable. There is a large right pleural effusion with consolidation of right mid and lung base. The left lung is clear. Osseous structures are stable. IMPRESSION: Large right pleural effusion with consolidation of right mid and lung base. Left lung is clear. Electronically Signed   By: Abelardo Diesel M.D.   On: 11/09/2016 11:50   US Thoracentesis Asp Pleural Space W/img  Guide  Result Date: 11/09/2016 INDICATION: Recurrent RIGHT pleural effusion EXAM: ULTRASOUND GUIDED THERAPEUTIC RIGHT THORACENTESIS MEDICATIONS: None. COMPLICATIONS: None immediate. PROCEDURE: Procedure, benefits, and risks of procedure were discussed with patient. Written informed consent for procedure was obtained. Time out protocol followed. Pleural effusion localized by ultrasound at the posterior RIGHT hemithorax. Skin prepped and draped in usual sterile fashion. Skin and soft tissues anesthetized with 10 mL of 1% lidocaine. 8 French thoracentesis catheter placed into the RIGHT pleural space. 2.1 L of yellow RIGHT pleural fluid aspirated by syringe pump. Procedure tolerated well by patient without immediate complication. FINDINGS: As above IMPRESSION: Successful ultrasound guided RIGHT thoracentesis yielding 2.1 L of pleural fluid. Electronically Signed   By: Lavonia Dana M.D.   On: 11/09/2016 15:14    Microbiology: Recent Results (from the past 240 hour(s))  MRSA PCR Screening     Status: Abnormal   Collection Time: 11/09/16  6:17 PM  Result Value Ref Range Status   MRSA by PCR POSITIVE (A) NEGATIVE Final    Comment:        The GeneXpert MRSA Assay (FDA approved for NASAL specimens only), is one component of a comprehensive MRSA colonization surveillance program. It is not intended to diagnose MRSA infection nor to guide or monitor treatment for MRSA infections. RESULT CALLED TO, READ BACK BY AND VERIFIED WITH: MCDANIEL M. AT [id number] ON [id number] BY THOMPSON S.      Labs: Basic Metabolic Panel:  Recent Labs Lab 11/09/16 1048 11/10/16 0405 11/11/16 0621 11/12/16 0653  NA 137 137 136 134*  K 3.1* 4.2 4.1 3.9  CL 101 101 101 98*  CO2 22 28 27 29   GLUCOSE 101* 97 90 110*  BUN 8 10 15 15   CREATININE 0.74 0.89 1.16 1.13  CALCIUM 8.2* 7.8* 7.8* 7.6*  MG  --  1.3*  --   --    Liver Function Tests:  Recent Labs Lab 11/09/16 1048  AST 83*  ALT 26  ALKPHOS 143*  BILITOT 2.4*   PROT 6.9  ALBUMIN 2.5*   No results for input(s): LIPASE, AMYLASE in the last 168 hours. No results for input(s): AMMONIA in the last 168 hours. CBC:  Recent Labs Lab 11/09/16 1048 11/10/16 0405 11/11/16 0621 11/12/16 0653  WBC 4.2 3.7* 3.6* 4.0  NEUTROABS 2.4  --   --  2.2  HGB 12.3* 11.2* 12.2* 12.9*  HCT 35.6* 33.1* 36.5* 38.4*  MCV 104.7* 107.5* 107.4* 107.9*  PLT 53* 28* 31* 38*   Cardiac Enzymes:  Recent Labs Lab 11/09/16 1048  TROPONINI <0.03   BNP: BNP (last 3 results)  Recent Labs  07/18/16 1845 08/03/16 2131 11/09/16 1048  BNP 236.0* 216.0* 725.0*    ProBNP (last 3 results) No results for input(s): PROBNP in the last 8760 hours.  CBG: No results for input(s): GLUCAP in the last 168 hours.     Signed:  Telisa Ohlsen MD.  [facility] 11/12/2016, 10:56 AM

## 2016-12-05 ENCOUNTER — Encounter (INDEPENDENT_AMBULATORY_CARE_PROVIDER_SITE_OTHER): Payer: Self-pay | Admitting: *Deleted

## 2016-12-05 ENCOUNTER — Other Ambulatory Visit (INDEPENDENT_AMBULATORY_CARE_PROVIDER_SITE_OTHER): Payer: Self-pay | Admitting: *Deleted

## 2016-12-05 ENCOUNTER — Telehealth (INDEPENDENT_AMBULATORY_CARE_PROVIDER_SITE_OTHER): Payer: Self-pay | Admitting: *Deleted

## 2016-12-05 DIAGNOSIS — R188 Other ascites: Secondary | ICD-10-CM

## 2016-12-05 NOTE — Telephone Encounter (Signed)
Received a call from [facility] to check on what is going on on patient for mother that is in their office being seen this morning and is his transportation.  Vicente Males was looking and could not find any documentation that occurred this morning with our office.  Mother said her son called in here and spoke with Karna Christmas and was told we would be getting him scheduled.  She was only asking them to see if we had done anything since she is his means of transportation and needed to know.  I looked and didn't see any orders or documentation.  Karna Christmas was with patient and I felt it was in the best interest for patient and since [facility] was on phone to go to [facility] then Dr. [name].  He looked and saw he had one 9/26 from ER and had missed two appointments here 1 no show and 1 cancellation.  If I remember they had a death in the family around that time.  Dr. [name] instructed Ann to schedule, Tammy put the order in and Ms. Morejon will come here to go over specifics after leaving their office.  Dr. [name] asked that he be made an appointment here since he has not been seen since June.  Rosendo Gros is aware of this and will tell Vicente Males and instruct mother to come here for instructions etc.

## 2016-12-05 NOTE — Telephone Encounter (Addendum)
Spoke with Karna Christmas once she finished patients.  She said that patient has not called in here today and if he did she would have got Ann to be working on order.  Hope said she is not aware of him calling because he would have had to come though her to be transferred.  Terri said unless he is on her machine and she just finished with patients she knows nothing about this.  Lelon Frohlich has tried calling mom and she has not shown up here to go over this.  Wondering did they call somewhere else?  The documentation is complete on this chart to the best of my knowledge and my staffs knowledge.

## 2016-12-05 NOTE — Telephone Encounter (Signed)
Thoracentesis sch'd 12/06/16 at [phone] ([phone]), OV w/ Terri sch'd 12/14/16 at 11:15, appt info given to [name]

## 2016-12-06 ENCOUNTER — Encounter (HOSPITAL_COMMUNITY): Payer: Self-pay

## 2016-12-06 ENCOUNTER — Ambulatory Visit (HOSPITAL_COMMUNITY)
Admission: RE | Admit: 2016-12-06 | Discharge: 2016-12-06 | Disposition: A | Discharge status: Home / Self Care | Payer: Medicare Other | Source: Ambulatory Visit | Attending: Diagnostic Radiology | Admitting: Diagnostic Radiology

## 2016-12-06 ENCOUNTER — Ambulatory Visit (HOSPITAL_COMMUNITY)
Admission: RE | Admit: 2016-12-06 | Discharge: 2016-12-06 | Disposition: A | Discharge status: Home / Self Care | Payer: Medicare Other | Source: Ambulatory Visit | Attending: Internal Medicine | Admitting: Internal Medicine

## 2016-12-06 DIAGNOSIS — R188 Other ascites: Secondary | ICD-10-CM | POA: Diagnosis not present

## 2016-12-06 DIAGNOSIS — K7460 Unspecified cirrhosis of liver: Secondary | ICD-10-CM | POA: Diagnosis not present

## 2016-12-06 DIAGNOSIS — J9811 Atelectasis: Secondary | ICD-10-CM | POA: Diagnosis not present

## 2016-12-06 DIAGNOSIS — Z9889 Other specified postprocedural states: Secondary | ICD-10-CM

## 2016-12-06 DIAGNOSIS — J90 Pleural effusion, not elsewhere classified: Secondary | ICD-10-CM | POA: Insufficient documentation

## 2016-12-06 LAB — GRAM STAIN: Gram Stain: NONE SEEN

## 2016-12-06 LAB — BODY FLUID CELL COUNT WITH DIFFERENTIAL
EOS FL: 0 %
LYMPHS FL: 46 %
Monocyte-Macrophage-Serous Fluid: 53 % (ref 50–90)
NEUTROPHIL FLUID: 1 % (ref 0–25)
OTHER CELLS FL: 0 %
Total Nucleated Cell Count, Fluid: 87 cu mm (ref 0–1000)

## 2016-12-06 NOTE — Procedures (Signed)
PreOperative Dx: Cirrhosis, ascites, RIGHT pleural effusion Postoperative Dx: Cirrhosis, ascites, RIGHT pleural effusion Procedure:   US guided RIGHT thoracentesis Radiologist:  Thornton Papas Anesthesia:  10 ml of 1% lidocaine Specimen:  2.0 L of yellow colored fluid EBL:   < 1 ml Complications: None

## 2016-12-06 NOTE — Progress Notes (Signed)
Thoracentesis complete no signs of distress, 2L yellow colored pleural fluid removed.

## 2016-12-07 LAB — PATHOLOGIST SMEAR REVIEW

## 2016-12-11 LAB — CULTURE, BODY FLUID-BOTTLE: CULTURE: NO GROWTH

## 2016-12-11 LAB — CULTURE, BODY FLUID W GRAM STAIN -BOTTLE

## 2016-12-13 ENCOUNTER — Ambulatory Visit (INDEPENDENT_AMBULATORY_CARE_PROVIDER_SITE_OTHER): Payer: Medicare Other | Admitting: Internal Medicine

## 2016-12-13 ENCOUNTER — Telehealth (INDEPENDENT_AMBULATORY_CARE_PROVIDER_SITE_OTHER): Payer: Self-pay | Admitting: Internal Medicine

## 2016-12-13 ENCOUNTER — Encounter (INDEPENDENT_AMBULATORY_CARE_PROVIDER_SITE_OTHER): Payer: Self-pay | Admitting: Internal Medicine

## 2016-12-13 NOTE — Telephone Encounter (Signed)
Per your request, the patient was given an appointment for this week.  He had an appointment with Terri for today at 11:15am.  I called the patient last Thursday, 12/01/16 and was not able to leave him a message on his phone, his mailbox was not set up.  I spoke to the patient on Friday and explained this to him and he was very apologetic.  I gave him his appointment day and time.  He was a no show today and I wanted to make you aware.

## 2016-12-22 ENCOUNTER — Inpatient Hospital Stay (HOSPITAL_COMMUNITY)
Admission: EM | Admit: 2016-12-22 | Discharge: 2016-12-29 | DRG: 308 | Disposition: A | Discharge status: Home / Self Care | Payer: Medicare Other | Attending: Internal Medicine | Admitting: Internal Medicine

## 2016-12-22 ENCOUNTER — Encounter (HOSPITAL_COMMUNITY): Payer: Self-pay | Admitting: Emergency Medicine

## 2016-12-22 ENCOUNTER — Emergency Department (HOSPITAL_COMMUNITY): Payer: Medicare Other

## 2016-12-22 ENCOUNTER — Other Ambulatory Visit: Payer: Self-pay

## 2016-12-22 ENCOUNTER — Inpatient Hospital Stay (HOSPITAL_COMMUNITY): Payer: Medicare Other

## 2016-12-22 DIAGNOSIS — I428 Other cardiomyopathies: Secondary | ICD-10-CM | POA: Diagnosis present

## 2016-12-22 DIAGNOSIS — K7031 Alcoholic cirrhosis of liver with ascites: Secondary | ICD-10-CM | POA: Diagnosis present

## 2016-12-22 DIAGNOSIS — I482 Chronic atrial fibrillation: Secondary | ICD-10-CM | POA: Diagnosis present

## 2016-12-22 DIAGNOSIS — I5023 Acute on chronic systolic (congestive) heart failure: Secondary | ICD-10-CM | POA: Diagnosis present

## 2016-12-22 DIAGNOSIS — R188 Other ascites: Secondary | ICD-10-CM

## 2016-12-22 DIAGNOSIS — K429 Umbilical hernia without obstruction or gangrene: Secondary | ICD-10-CM | POA: Diagnosis present

## 2016-12-22 DIAGNOSIS — J90 Pleural effusion, not elsewhere classified: Secondary | ICD-10-CM | POA: Diagnosis present

## 2016-12-22 DIAGNOSIS — R0602 Shortness of breath: Secondary | ICD-10-CM

## 2016-12-22 DIAGNOSIS — R0603 Acute respiratory distress: Secondary | ICD-10-CM

## 2016-12-22 DIAGNOSIS — Z9889 Other specified postprocedural states: Secondary | ICD-10-CM

## 2016-12-22 DIAGNOSIS — D638 Anemia in other chronic diseases classified elsewhere: Secondary | ICD-10-CM | POA: Diagnosis present

## 2016-12-22 DIAGNOSIS — E785 Hyperlipidemia, unspecified: Secondary | ICD-10-CM | POA: Diagnosis present

## 2016-12-22 DIAGNOSIS — I110 Hypertensive heart disease with heart failure: Secondary | ICD-10-CM | POA: Diagnosis present

## 2016-12-22 DIAGNOSIS — I361 Nonrheumatic tricuspid (valve) insufficiency: Secondary | ICD-10-CM | POA: Diagnosis not present

## 2016-12-22 DIAGNOSIS — K7460 Unspecified cirrhosis of liver: Secondary | ICD-10-CM

## 2016-12-22 DIAGNOSIS — R945 Abnormal results of liver function studies: Secondary | ICD-10-CM

## 2016-12-22 DIAGNOSIS — Z8673 Personal history of transient ischemic attack (TIA), and cerebral infarction without residual deficits: Secondary | ICD-10-CM | POA: Diagnosis not present

## 2016-12-22 DIAGNOSIS — F419 Anxiety disorder, unspecified: Secondary | ICD-10-CM | POA: Diagnosis present

## 2016-12-22 DIAGNOSIS — I4891 Unspecified atrial fibrillation: Secondary | ICD-10-CM | POA: Diagnosis not present

## 2016-12-22 DIAGNOSIS — I2510 Atherosclerotic heart disease of native coronary artery without angina pectoris: Secondary | ICD-10-CM | POA: Diagnosis present

## 2016-12-22 DIAGNOSIS — Z8711 Personal history of peptic ulcer disease: Secondary | ICD-10-CM | POA: Diagnosis not present

## 2016-12-22 DIAGNOSIS — R079 Chest pain, unspecified: Secondary | ICD-10-CM | POA: Diagnosis present

## 2016-12-22 DIAGNOSIS — I252 Old myocardial infarction: Secondary | ICD-10-CM | POA: Diagnosis not present

## 2016-12-22 DIAGNOSIS — Z79899 Other long term (current) drug therapy: Secondary | ICD-10-CM | POA: Diagnosis not present

## 2016-12-22 DIAGNOSIS — K7290 Hepatic failure, unspecified without coma: Secondary | ICD-10-CM | POA: Diagnosis present

## 2016-12-22 DIAGNOSIS — E8770 Fluid overload, unspecified: Secondary | ICD-10-CM | POA: Diagnosis not present

## 2016-12-22 DIAGNOSIS — K920 Hematemesis: Secondary | ICD-10-CM | POA: Diagnosis present

## 2016-12-22 DIAGNOSIS — F329 Major depressive disorder, single episode, unspecified: Secondary | ICD-10-CM | POA: Diagnosis present

## 2016-12-22 DIAGNOSIS — D649 Anemia, unspecified: Secondary | ICD-10-CM

## 2016-12-22 DIAGNOSIS — R251 Tremor, unspecified: Secondary | ICD-10-CM | POA: Diagnosis present

## 2016-12-22 DIAGNOSIS — Z87891 Personal history of nicotine dependence: Secondary | ICD-10-CM

## 2016-12-22 DIAGNOSIS — Z66 Do not resuscitate: Secondary | ICD-10-CM | POA: Diagnosis present

## 2016-12-22 DIAGNOSIS — D696 Thrombocytopenia, unspecified: Secondary | ICD-10-CM | POA: Diagnosis present

## 2016-12-22 DIAGNOSIS — K7689 Other specified diseases of liver: Secondary | ICD-10-CM | POA: Diagnosis not present

## 2016-12-22 HISTORY — DX: Pleural effusion, not elsewhere classified: J90

## 2016-12-22 HISTORY — DX: Paroxysmal atrial fibrillation: I48.0

## 2016-12-22 HISTORY — DX: Cardiomyopathy, unspecified: I42.9

## 2016-12-22 LAB — COMPREHENSIVE METABOLIC PANEL
ALBUMIN: 2.4 g/dL — AB (ref 3.5–5.0)
ALT: 23 U/L (ref 17–63)
AST: 63 U/L — AB (ref 15–41)
Alkaline Phosphatase: 195 U/L — ABNORMAL HIGH (ref 38–126)
Anion gap: 11 (ref 5–15)
BUN: 12 mg/dL (ref 6–20)
CHLORIDE: 103 mmol/L (ref 101–111)
CO2: 22 mmol/L (ref 22–32)
CREATININE: 0.91 mg/dL (ref 0.61–1.24)
Calcium: 7.4 mg/dL — ABNORMAL LOW (ref 8.9–10.3)
GFR calc non Af Amer: >60 mL/min (ref >60)
GLUCOSE: 136 mg/dL — AB (ref 65–99)
Potassium: 3.6 mmol/L (ref 3.5–5.1)
SODIUM: 136 mmol/L (ref 135–145)
Total Bilirubin: 1.8 mg/dL — ABNORMAL HIGH (ref 0.3–1.2)
Total Protein: 6.8 g/dL (ref 6.5–8.1)

## 2016-12-22 LAB — CBC WITH DIFFERENTIAL/PLATELET
BASOS ABS: 0 10*3/uL (ref 0.0–0.1)
BASOS PCT: 1 %
EOS ABS: 0.1 10*3/uL (ref 0.0–0.7)
EOS PCT: 2 %
HCT: 37.4 % — ABNORMAL LOW (ref 39.0–52.0)
Hemoglobin: 12.6 g/dL — ABNORMAL LOW (ref 13.0–17.0)
Lymphocytes Relative: 31 %
Lymphs Abs: 1.7 10*3/uL (ref 0.7–4.0)
MCH: 35.3 pg — ABNORMAL HIGH (ref 26.0–34.0)
MCHC: 33.7 g/dL (ref 30.0–36.0)
MCV: 104.8 fL — ABNORMAL HIGH (ref 78.0–100.0)
Monocytes Absolute: 0.6 10*3/uL (ref 0.1–1.0)
Monocytes Relative: 11 %
NEUTROS PCT: 56 %
Neutro Abs: 3.1 10*3/uL (ref 1.7–7.7)
PLATELETS: 100 10*3/uL — AB (ref 150–400)
RBC: 3.57 MIL/uL — AB (ref 4.22–5.81)
RDW: 15 % (ref 11.5–15.5)
WBC: 5.6 10*3/uL (ref 4.0–10.5)

## 2016-12-22 LAB — BRAIN NATRIURETIC PEPTIDE: B NATRIURETIC PEPTIDE 5: 331 pg/mL — AB (ref 0.0–100.0)

## 2016-12-22 LAB — PROTIME-INR
INR: 1.2
Prothrombin Time: 15.2 seconds (ref 11.4–15.2)

## 2016-12-22 LAB — TROPONIN I

## 2016-12-22 LAB — LIPASE, BLOOD: Lipase: 40 U/L (ref 11–51)

## 2016-12-22 MED ORDER — SUCRALFATE 1 GM/10ML PO SUSP
1.0000 g | Freq: Three times a day (TID) | ORAL | Status: DC
  Administered 2016-12-23 – 2016-12-28 (×24): 1 g via ORAL
  Filled 2016-12-22 (×25): qty 10

## 2016-12-22 MED ORDER — SODIUM CHLORIDE 0.9% FLUSH: 3.0000 mL | INTRAVENOUS | Status: DC | PRN

## 2016-12-22 MED ORDER — SODIUM CHLORIDE 0.9 % IV SOLN
INTRAVENOUS | Status: DC
  Administered 2016-12-22: 20:00:00 via INTRAVENOUS

## 2016-12-22 MED ORDER — HYDROCODONE-HOMATROPINE 5-1.5 MG/5ML PO SYRP
5.0000 mL | ORAL_SOLUTION | Freq: Four times a day (QID) | ORAL | Status: DC | PRN
  Administered 2016-12-22: 5 mL via ORAL
  Filled 2016-12-22: qty 5

## 2016-12-22 MED ORDER — RIFAXIMIN 550 MG PO TABS
550.0000 mg | ORAL_TABLET | Freq: Two times a day (BID) | ORAL | Status: DC
  Administered 2016-12-23 – 2016-12-29 (×14): 550 mg via ORAL
  Filled 2016-12-22 (×14): qty 1

## 2016-12-22 MED ORDER — ONDANSETRON HCL 4 MG/2ML IJ SOLN
4.0000 mg | Freq: Once | INTRAMUSCULAR | Status: AC
  Administered 2016-12-22: 4 mg via INTRAVENOUS
  Filled 2016-12-22: qty 2

## 2016-12-22 MED ORDER — LACTULOSE 10 GM/15ML PO SOLN
10.0000 g | Freq: Two times a day (BID) | ORAL | Status: DC
  Administered 2016-12-23 (×3): 10 g via ORAL
  Filled 2016-12-22 (×3): qty 30

## 2016-12-22 MED ORDER — SODIUM CHLORIDE 0.9 % IV SOLN
8.0000 mg/h | INTRAVENOUS | Status: DC
  Administered 2016-12-23 – 2016-12-24 (×3): 8 mg/h via INTRAVENOUS
  Filled 2016-12-22 (×8): qty 80

## 2016-12-22 MED ORDER — SPIRONOLACTONE 25 MG PO TABS
50.0000 mg | ORAL_TABLET | Freq: Every day | ORAL | Status: DC
  Administered 2016-12-23: 50 mg via ORAL
  Filled 2016-12-22: qty 2

## 2016-12-22 MED ORDER — SODIUM CHLORIDE 0.9% FLUSH
3.0000 mL | Freq: Two times a day (BID) | INTRAVENOUS | Status: DC
  Administered 2016-12-23 – 2016-12-29 (×13): 3 mL via INTRAVENOUS

## 2016-12-22 MED ORDER — BUSPIRONE HCL 5 MG PO TABS
7.5000 mg | ORAL_TABLET | Freq: Two times a day (BID) | ORAL | Status: DC
  Administered 2016-12-23 – 2016-12-29 (×14): 7.5 mg via ORAL
  Filled 2016-12-22 (×14): qty 2

## 2016-12-22 MED ORDER — FLUOXETINE HCL 20 MG PO CAPS
20.0000 mg | ORAL_CAPSULE | Freq: Every day | ORAL | Status: DC
  Administered 2016-12-23 – 2016-12-29 (×7): 20 mg via ORAL
  Filled 2016-12-22 (×11): qty 1

## 2016-12-22 MED ORDER — FUROSEMIDE 40 MG PO TABS: 60.0000 mg | ORAL_TABLET | Freq: Every day | ORAL | Status: DC

## 2016-12-22 MED ORDER — SODIUM CHLORIDE 0.9 % IV SOLN: INTRAVENOUS | Status: DC

## 2016-12-22 MED ORDER — DILTIAZEM HCL-DEXTROSE 100-5 MG/100ML-% IV SOLN (PREMIX)
5.0000 mg/h | INTRAVENOUS | Status: DC
  Administered 2016-12-22 – 2016-12-23 (×3): 5 mg/h via INTRAVENOUS
  Filled 2016-12-22 (×3): qty 100

## 2016-12-22 MED ORDER — SODIUM CHLORIDE 0.9 % IV SOLN
80.0000 mg | Freq: Once | INTRAVENOUS | Status: AC
  Administered 2016-12-23: 80 mg via INTRAVENOUS
  Filled 2016-12-22: qty 80

## 2016-12-22 MED ORDER — CARVEDILOL 3.125 MG PO TABS
6.2500 mg | ORAL_TABLET | Freq: Two times a day (BID) | ORAL | Status: DC
  Administered 2016-12-23 – 2016-12-29 (×14): 6.25 mg via ORAL
  Filled 2016-12-22 (×14): qty 2

## 2016-12-22 MED ORDER — DILTIAZEM LOAD VIA INFUSION
10.0000 mg | Freq: Once | INTRAVENOUS | Status: AC
  Administered 2016-12-22: 10 mg via INTRAVENOUS
  Filled 2016-12-22: qty 10

## 2016-12-22 MED ORDER — FUROSEMIDE 10 MG/ML IJ SOLN
80.0000 mg | Freq: Once | INTRAMUSCULAR | Status: AC
  Administered 2016-12-22: 80 mg via INTRAVENOUS
  Filled 2016-12-22: qty 8

## 2016-12-22 MED ORDER — VITAMIN B-1 100 MG PO TABS
50.0000 mg | ORAL_TABLET | Freq: Every day | ORAL | Status: DC
Start: 2016-12-23 — End: 2016-12-29
  Administered 2016-12-23 – 2016-12-29 (×7): 50 mg via ORAL
  Filled 2016-12-22 (×7): qty 1

## 2016-12-22 MED ORDER — SODIUM CHLORIDE 0.9 % IV SOLN: 250.0000 mL | INTRAVENOUS | Status: DC | PRN
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
## 2016-12-22 NOTE — H&P (Signed)
TRH H&P   Patient Demographics:    [name], is a 56 y.o. male  MRN: [id number]   DOB - Aug 28, 1960  Admit Date - 12/22/2016  Outpatient Primary MD for the patient is Buelah Manis, Modena Nunnery, MD  Referring MD/NP/PA: Aundria Rud  Outpatient Specialists:  Dr. Luan Pulling Dr. Harl Bowie Dr. [name] Delsa Grana  Patient coming from: home  Chief Complaint  Patient presents with  . Respiratory Distress      HPI:    [name]  is a 56 y.o. male, w Hypertension, Hyperlipidemia, CAD, Stroke, Cirrhosis, Recurrent R pleural effusion, apparently c/o increase in dyspnea.  Pt presented to Ed  Pt noted palpitations . Slight chest pain substernal , "sharp"  Without radiation.  Slight nausea  Slight emesis, pt noted slight blood.  Denies fever, chills, abd pain, diarrhea, brbpr, black stool, dysuria, hematuria.   In ED Wbc 5.6, hgb 12.6, Plt 100 Na 136 K 3.6 Ast 63, Alt 23, Alk phos 195, T. Bili 1.8 Alb 2.4 Bun 12, Creatinine 0.91 Lipase 40 BNP 331 INR 1.2  Pt will admitted for Afib with RVR, enlarging R pleural effusion, Ascites, Cp, possibly UGI bleeding     Review of systems:    In addition to the HPI above, No Fever-chills, No Headache, No changes with Vision or hearing, No problems swallowing food or Liquids,  No Abdominal pain,  No Blood in stool or Urine, No dysuria, No new skin rashes or bruises, No new joints pains-aches,  No new weakness, tingling, numbness in any extremity, No recent weight gain or loss, No polyuria, polydypsia or polyphagia, No significant Mental Stressors.  A full 10 point Review of Systems was done, except as stated above, all other Review of Systems were negative.   With Past History of the following :    Past Medical History:  Diagnosis Date  . Anxiety   . Chronic back pain   . Cirrhosis ([place])   . Coronary artery disease   .  DDD (degenerative disc disease), lumbosacral   . Depression   . Elevated LFTs    secondary to ETOH  . History of alcohol abuse   . Hx of cardiac cath 1996  . Hyperlipidemia   . Hypertension   . Liver failure ([place])   . MI (myocardial infarction) ([place])   . Stroke [facility]) 2003      Past Surgical History:  Procedure Laterality Date  . CARDIAC CATHETERIZATION  1996      Social History:     Social History   Tobacco Use  . Smoking status: Never Smoker  . Smokeless tobacco: Former Systems developer    Types: Chew  Substance Use Topics  . Alcohol use: No    Alcohol/week: 0.0 oz    Comment: quit - 5 months ago     Lives - at home  Mobility -  Walks by self  Family History :     Family History  Problem Relation Age of Onset  . Heart disease Mother   . Cancer Father        throat cancer  . Cancer Brother        melanoma  . Thyroid disease Sister       Home Medications:   Prior to Admission medications   Medication Sig Start Date End Date Taking? Authorizing Provider  busPIRone (BUSPAR) 7.5 MG tablet Take 1 tablet (7.5 mg total) by mouth 2 (two) times daily. FOR ANXIETY 07/06/16   [place], Modena Nunnery, MD  CARAFATE 1 GM/10ML suspension TAKE TWO TEASPOONSFUL (10 ML) BY MOUTH FOUR TIMES DAILY - WITH MEALS AND AT BEDTIME 07/27/16   [place], Modena Nunnery, MD  carvedilol (COREG) 6.25 MG tablet Take 1 tablet (6.25 mg total) by mouth 2 (two) times daily. 07/06/16   [name], MD  EPINEPHrine 0.3 mg/0.3 mL IJ SOAJ injection Inject 0.3 mLs (0.3 mg total) into the muscle once. 06/11/15   [name], MD  FLUoxetine (PROZAC) 20 MG tablet Take 1 tablet (20 mg total) by mouth daily. 07/06/16   [name], MD  furosemide (LASIX) 40 MG tablet Take 0.5 tablets (20 mg total) by mouth 2 (two) times daily. 11/12/16 12/12/16  Rexene Alberts, MD  lactulose [facility]) 10 GM/15ML solution TAKE 22.5ML BY MOUTH TWICE DAILY 08/29/16   [name], MD  omeprazole (PRILOSEC) 20 MG capsule Take  1 capsule (20 mg total) by mouth 2 (two) times daily. 07/06/16   [name], MD  sildenafil (VIAGRA) 100 MG tablet Take 0.5 tablets (50 mg total) by mouth daily as needed for erectile dysfunction. Take 1/2 tablet ( 50 mg ) 30 minutes prior to sexual engagement. 09/07/16   Arnoldo Lenis, MD  spironolactone (ALDACTONE) 50 MG tablet Take 0.5 tablets (25 mg total) by mouth daily. 11/12/16 12/12/16  Rexene Alberts, MD  thiamine (VITAMIN B-1) 50 MG tablet Take by mouth daily.    [provider]     Allergies:     Allergies  Allergen Reactions  . Albumin (Human) Hives and Itching  . Bee Venom Anaphylaxis and Nausea Only  . Bee Venom Anaphylaxis, Nausea And Vomiting and Swelling    Throat swelling  . Other Hives    Pt states there was a fluid through IV he was getting while getting fluid taken off his stomach. It gave him hives     Physical Exam:   Vitals  Blood pressure 124/80, pulse 64, resp. rate (!) 26, SpO2 96 %.   1. General lying in bed in NAD,    2. Normal affect and insight, Not Suicidal or Homicidal, Awake Alert, Oriented X 3.  3. No F.N deficits, ALL C.Nerves Intact, Strength 5/5 all 4 extremities, Sensation intact all 4 extremities, Plantars down going.  4. Ears and Eyes appear Normal, Conjunctivae clear, PERRLA. Moist Oral Mucosa.  5. Supple Neck, No JVD, No cervical lymphadenopathy appriciated, No Carotid Bruits.  6. Symmetrical Chest wall movement, Good air movement bilaterally, decrease bs at right lung 1/3 up, no crackles, no wheezing  7. RRR, No Gallops, Rubs or Murmurs, No Parasternal Heave.  8. Positive Bowel Sounds, Abdomen Soft, No tenderness, No organomegaly appriciated,No rebound -guarding or rigidity.   + distention,  + umbilical hernia  9.  No Cyanosis, Normal Skin Turgor, No Skin Rash or Bruise.  10. Good muscle tone,  joints appear normal , no effusions, Normal ROM.  11. No Palpable Lymph Nodes in Neck or Axillae    Data Review:      CBC Recent Labs  Lab 12/22/16 1957  WBC 5.6  HGB 12.6*  HCT 37.4*  PLT 100*  MCV 104.8*  MCH 35.3*  MCHC 33.7  RDW 15.0  LYMPHSABS 1.7  MONOABS 0.6  EOSABS 0.1  BASOSABS 0.0   ------------------------------------------------------------------------------------------------------------------  Chemistries  Recent Labs  Lab 12/22/16 1957  NA 136  K 3.6  CL 103  CO2 22  GLUCOSE 136*  BUN 12  CREATININE 0.91  CALCIUM 7.4*  AST 63*  ALT 23  ALKPHOS 195*  BILITOT 1.8*   ------------------------------------------------------------------------------------------------------------------ CrCl cannot be calculated (Unknown ideal weight.). ------------------------------------------------------------------------------------------------------------------ No results for input(s): TSH, T4TOTAL, T3FREE, THYROIDAB in the last 72 hours.  Invalid input(s): FREET3  Coagulation profile Recent Labs  Lab 12/22/16 1957  INR 1.20   ------------------------------------------------------------------------------------------------------------------- No results for input(s): DDIMER in the last 72 hours. -------------------------------------------------------------------------------------------------------------------  Cardiac Enzymes No results for input(s): CKMB, TROPONINI, MYOGLOBIN in the last 168 hours.  Invalid input(s): CK ------------------------------------------------------------------------------------------------------------------    Component Value Date/Time   BNP 331.0 (H) 12/22/2016 1957     ---------------------------------------------------------------------------------------------------------------  Urinalysis    Component Value Date/Time   COLORURINE AMBER (A) 12/14/2015 1439   APPEARANCEUR CLEAR 12/14/2015 1439   LABSPEC 1.015 12/14/2015 1439   PHURINE 6.0 12/14/2015 1439   GLUCOSEU TRACE (A) 12/14/2015 1439   HGBUR 3+ (A) 12/14/2015 1439   BILIRUBINUR  NEGATIVE 12/14/2015 1439   KETONESUR TRACE (A) 12/14/2015 1439   PROTEINUR 1+ (A) 12/14/2015 1439   UROBILINOGEN 1.0 07/26/2014 2231   NITRITE NEGATIVE 12/14/2015 1439   LEUKOCYTESUR NEGATIVE 12/14/2015 1439    ----------------------------------------------------------------------------------------------------------------   Imaging Results:    Dg Chest Port 1 View  Result Date: 12/22/2016 CLINICAL DATA:  Cough for 4 days. EXAM: PORTABLE CHEST 1 VIEW COMPARISON:  Chest radiograph 12/06/2016. FINDINGS: Monitoring leads overlie the patient. Stable cardiomegaly. Interval increase in size of large right pleural effusion. Left lung is clear. Minimal aerated pulmonary tissue right lung apex. No pneumothorax. IMPRESSION: Interval increase in size of large right pleural effusion with minimal aerated pulmonary parenchyma right lung apex. Cardiomegaly. Electronically Signed   By: Lovey Newcomer M.D.   On: 12/22/2016 20:21    Afib at 180   Assessment & Plan:    Principal Problem:   Atrial fibrillation [facility]) Active Problems:   Atrial fibrillation with RVR (HCC)   Pleural effusion, right   Thrombocytopenia (HCC)   Anemia   Abnormal liver function    Afib with RVR No anticoaulation due to concern for GI bleeding Trop I q6h x3 Check tsh Check cardiac echo Cardizem GTT  Cp Tele Trop I q6h x3 Continue carvedilol  R pleural effusion U/s guided thoracentesis in AM  Hematemesis NPO protonix 71m iv x1,  protonix 843mhr Cont sucalafate Check cbc in am GI consult placed in computer  Ascites Cont lasix, cont aldactone Defer to GI for management  Hepatic encephalopathy Cont lactulose Add rifaxamin  Depression Cont prozac, buspar     DVT Prophylaxis SCDs   AM Labs Ordered, also please review Full Orders  Family Communication: Admission, patients condition and plan of care including tests being ordered have been discussed with the patient  who indicate understanding and  agree with the plan and Code Status.  Code Status DNR  Likely DC to  Home ?  Condition GUARDED    Consults called: GI placed in computer  Admission status: inpatient  Time spent in minutes : 45    Jani Gravel M.D on 12/22/2016 at 10:12 PM  Between 7am to 7pm - Pager - [phone]  . After 7pm go to [email or web address] - password [facility]  [facility] - Office  [phone]

## 2016-12-22 NOTE — ED Triage Notes (Signed)
Pt states he started having a cough 4 days ago that started getting worse yesterday. Pt complaining of shortness of breath that started yesterday. Pt states he started coughing up blood yesterday morning.

## 2016-12-23 ENCOUNTER — Encounter (HOSPITAL_COMMUNITY): Payer: Self-pay | Admitting: Family Medicine

## 2016-12-23 ENCOUNTER — Inpatient Hospital Stay (HOSPITAL_COMMUNITY): Payer: Medicare Other

## 2016-12-23 ENCOUNTER — Other Ambulatory Visit: Payer: Self-pay

## 2016-12-23 DIAGNOSIS — I361 Nonrheumatic tricuspid (valve) insufficiency: Secondary | ICD-10-CM

## 2016-12-23 DIAGNOSIS — D696 Thrombocytopenia, unspecified: Secondary | ICD-10-CM

## 2016-12-23 DIAGNOSIS — J90 Pleural effusion, not elsewhere classified: Secondary | ICD-10-CM

## 2016-12-23 DIAGNOSIS — K7689 Other specified diseases of liver: Secondary | ICD-10-CM

## 2016-12-23 DIAGNOSIS — K7031 Alcoholic cirrhosis of liver with ascites: Secondary | ICD-10-CM

## 2016-12-23 DIAGNOSIS — E8770 Fluid overload, unspecified: Secondary | ICD-10-CM

## 2016-12-23 DIAGNOSIS — I4891 Unspecified atrial fibrillation: Secondary | ICD-10-CM

## 2016-12-23 LAB — ECHOCARDIOGRAM COMPLETE
AVLVOTPG: 3 mmHg
CHL CUP STROKE VOLUME: 67 mL
EWDT: 176 ms
FS: 25 % — AB (ref 28–44)
HEIGHTINCHES: 75 in
IV/PV OW: 0.98
LA diam index: 2.16 cm/m2
LASIZE: 53 mm
LAVOL: 170 mL
LAVOLA4C: 151 mL
LAVOLIN: 69.2 mL/m2
LEFT ATRIUM END SYS DIAM: 53 mm
LV PW d: 10.7 mm — AB (ref 0.6–1.1)
LV sys vol: 64 mL — AB
LVDIAVOL: 131 mL (ref 62–150)
LVDIAVOLIN: 53 mL/m2
LVOT VTI: 18.3 cm
LVOT area: 3.8 cm2
LVOT diameter: 22 mm
LVOT peak vel: 82.9 cm/s
LVOTSV: 70 mL
LVSYSVOLIN: 26 mL/m2
Lateral S' vel: 12.6 cm/s
MV Dec: 176
MV Peak grad: 5 mmHg
MVPKEVEL: 108 m/s
RV sys press: 29 mmHg
Reg peak vel: 184 cm/s
Simpson's disk: 51
TAPSE: 16.8 mm
TRMAXVEL: 184 cm/s
WEIGHTICAEL: 3940.06 [oz_av]

## 2016-12-23 LAB — BODY FLUID CELL COUNT WITH DIFFERENTIAL
EOS FL: 0 %
Eos, Fluid: 0 %
LYMPHS FL: 63 %
LYMPHS FL: 40 %
MONOCYTE-MACROPHAGE-SEROUS FLUID: 35 % — AB (ref 50–90)
Monocyte-Macrophage-Serous Fluid: 59 % (ref 50–90)
NEUTROPHIL FLUID: 2 % (ref 0–25)
NEUTROPHIL FLUID: 1 % (ref 0–25)
Total Nucleated Cell Count, Fluid: 91 cu mm (ref 0–1000)
WBC FLUID: 70 uL (ref 0–1000)

## 2016-12-23 LAB — ALBUMIN, PLEURAL OR PERITONEAL FLUID: Albumin, Fluid: <1 g/dL

## 2016-12-23 LAB — COMPREHENSIVE METABOLIC PANEL
ALT: 22 U/L (ref 17–63)
AST: 60 U/L — AB (ref 15–41)
Albumin: 2.1 g/dL — ABNORMAL LOW (ref 3.5–5.0)
Alkaline Phosphatase: 160 U/L — ABNORMAL HIGH (ref 38–126)
Anion gap: 9 (ref 5–15)
BUN: 13 mg/dL (ref 6–20)
CHLORIDE: 104 mmol/L (ref 101–111)
CO2: 24 mmol/L (ref 22–32)
Calcium: 7.3 mg/dL — ABNORMAL LOW (ref 8.9–10.3)
Creatinine, Ser: 1.16 mg/dL (ref 0.61–1.24)
GFR calc Af Amer: >60 mL/min (ref >60)
Glucose, Bld: 116 mg/dL — ABNORMAL HIGH (ref 65–99)
POTASSIUM: 4.4 mmol/L (ref 3.5–5.1)
SODIUM: 137 mmol/L (ref 135–145)
Total Bilirubin: 1.8 mg/dL — ABNORMAL HIGH (ref 0.3–1.2)
Total Protein: 5.9 g/dL — ABNORMAL LOW (ref 6.5–8.1)

## 2016-12-23 LAB — GLUCOSE, PLEURAL OR PERITONEAL FLUID: Glucose, Fluid: 122 mg/dL

## 2016-12-23 LAB — PROTEIN, PLEURAL OR PERITONEAL FLUID

## 2016-12-23 LAB — GRAM STAIN: GRAM STAIN: NONE SEEN

## 2016-12-23 LAB — CBC
HEMATOCRIT: 33.4 % — AB (ref 39.0–52.0)
Hemoglobin: 11.2 g/dL — ABNORMAL LOW (ref 13.0–17.0)
MCH: 35.8 pg — AB (ref 26.0–34.0)
MCHC: 33.5 g/dL (ref 30.0–36.0)
MCV: 106.7 fL — AB (ref 78.0–100.0)
Platelets: 76 10*3/uL — ABNORMAL LOW (ref 150–400)
RBC: 3.13 MIL/uL — AB (ref 4.22–5.81)
RDW: 15.3 % (ref 11.5–15.5)
WBC: 4.8 10*3/uL (ref 4.0–10.5)

## 2016-12-23 LAB — TROPONIN I
Troponin I: <0.03 ng/mL (ref <0.03)
Troponin I: <0.03 ng/mL (ref <0.03)

## 2016-12-23 LAB — LACTATE DEHYDROGENASE, PLEURAL OR PERITONEAL FLUID: LD, Fluid: 35 U/L — ABNORMAL HIGH (ref 3–23)

## 2016-12-23 LAB — MRSA PCR SCREENING: MRSA BY PCR: NEGATIVE

## 2016-12-23 MED ORDER — FUROSEMIDE 10 MG/ML IJ SOLN
60.0000 mg | Freq: Two times a day (BID) | INTRAMUSCULAR | Status: DC
  Administered 2016-12-23 – 2016-12-28 (×12): 60 mg via INTRAVENOUS
  Filled 2016-12-23 (×15): qty 6

## 2016-12-23 MED ORDER — PANTOPRAZOLE SODIUM 40 MG IV SOLR
INTRAVENOUS | Status: AC
  Filled 2016-12-23: qty 160

## 2016-12-23 MED ORDER — SPIRONOLACTONE 100 MG PO TABS
100.0000 mg | ORAL_TABLET | Freq: Two times a day (BID) | ORAL | Status: DC
  Administered 2016-12-23 – 2016-12-29 (×12): 100 mg via ORAL
  Filled 2016-12-23 (×12): qty 1

## 2016-12-23 MED ORDER — PANTOPRAZOLE SODIUM 40 MG PO TBEC
40.0000 mg | DELAYED_RELEASE_TABLET | Freq: Two times a day (BID) | ORAL | Status: DC
  Administered 2016-12-24 – 2016-12-29 (×11): 40 mg via ORAL
  Filled 2016-12-23 (×11): qty 1

## 2016-12-23 MED ORDER — CYCLOBENZAPRINE HCL 10 MG PO TABS
5.0000 mg | ORAL_TABLET | Freq: Once | ORAL | Status: AC
  Administered 2016-12-23: 5 mg via ORAL
  Filled 2016-12-23: qty 1

## 2016-12-23 NOTE — Procedures (Signed)
PreOperative Dx: Recurrent RIGHT pleural effusion Postoperative Dx: Recurrent RIGHT pleural effusion Procedure:   US guided RIGHT thoracentesis Radiologist:  Thornton Papas Anesthesia:  10 ml of 1% lidocaine Specimen:  2.1 L of yellow colored fluid EBL:   < 1 ml Complications: None

## 2016-12-23 NOTE — Progress Notes (Signed)
*  PRELIMINARY RESULTS* Echocardiogram 2D Echocardiogram has been performed.  [name] 12/23/2016, 9:20 AM

## 2016-12-23 NOTE — Procedures (Signed)
PreOperative Dx: Cirrhosis, ascites Postoperative Dx: Cirrhosis, ascites Procedure:   US guided paracentesis Radiologist:  Neela Zecca Anesthesia:  10 ml of1% lidocaine Specimen:  3.5 L of yellow ascitic fluid EBL:   < 1 ml Complications: None   
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
## 2016-12-23 NOTE — Consult Note (Signed)
Cardiology Consultation:   Patient ID: [name]; [id number]; 1960-11-16   Admit date: 12/22/2016 Date of Consult: 12/23/2016  Primary Care Provider: [name], MD Primary Cardiologist: Carlyle Dolly, MD   Patient Profile:   [name] is a 56 y.o. male with a hx of chronic atrial fibrillation (no anticoagulation in the setting of GI bleed history); chronic systolic heart failure with LVEF of 40-45%, hypertension, hyperlipidemia, CVA,  recurrent right pleural effusion status post  thoracentesis, and EtOH cirrhosis followed by GI, who is being seen today for the evaluation of atrial fibrillation and CHF at the request of Wynetta Emery, hospitalist service  History of Present Illness:   Mr. Propes presented to the emergency room with complaints of increased dyspnea, racing heart rate, and sharp chest pain.  He states that he began to have worsening shortness of breath over the last several days, and began to have hematemesis (described as bright red) yesterday.  He felt his heart rate racing.  As result of this he came to the emergency room.  Although he has reported that he has stopped drinking, he admits to drinking a wine cooler about 3 days ago which he states he did not finish.  On arrival to the emergency room blood pressure 147/100, heart rate 180 bpm, he was tachypneic.  Did not see temperature recording documentation.  EKG revealed atrial fibrillation heart rate 187 bpm, low voltage.  Pertinent labs hemoglobin 12.6 hematocrit 37.4, platelets 100 (improved since hospitalization September 2018 when level was 38).  Potassium 3.6, sodium 136, glucose 136, creatinine 0.91.  BNP 331.  PT 15.2, INR 1.20.  Troponin negative x2 at less than 0.03.  Follow-up labs this a.m. reveals reduction in platelets to 76, potassium 4.4.  Creatinine now 1.16.  Chest x-ray revealed "massive right pleural effusion occupying approximately 90% of the right hemothorax volume and causing leftward  shift of the mediastinal structures", there was complete collapse of right middle and lower lobes.  Other findings include shrunken nodular liver, upper abdominal ascites.  Coronary artery and aortic atherosclerosis was also noted.  The patient was given IV diltiazem 10 mg bolus and begun on the diltiazem drip, patient was also started on Lasix and was given 80 mg IV x1.  Your plans for an ultrasound-guided thoracentesis at this a.m.  There is been no urine output documented since admission.  Past Medical History:  Diagnosis Date  . Anxiety   . Cardiomyopathy ([place])   . Chronic back pain   . Cirrhosis ([place])   . DDD (degenerative disc disease), lumbosacral   . Depression   . History of alcohol abuse   . Hx of cardiac cath 1996  . Hyperlipidemia   . Hypertension   . Liver failure ([place])   . PAF (paroxysmal atrial fibrillation) ([place])   . Recurrent pleural effusion on right   . Stroke [facility]) 2003    Past Surgical History:  Procedure Laterality Date  . CARDIAC CATHETERIZATION  1996     Home Medications:  Prior to Admission medications   Medication Sig Start Date End Date Taking? Authorizing Provider  busPIRone (BUSPAR) 7.5 MG tablet Take 1 tablet (7.5 mg total) by mouth 2 (two) times daily. FOR ANXIETY 07/06/16  Yes [place], Modena Nunnery, MD  CARAFATE 1 GM/10ML suspension TAKE TWO TEASPOONSFUL (10 ML) BY MOUTH FOUR TIMES DAILY - WITH MEALS AND AT BEDTIME 07/27/16  Yes [place], Modena Nunnery, MD  carvedilol (COREG) 6.25 MG tablet Take 1 tablet (6.25 mg total)  by mouth 2 (two) times daily. 07/06/16  Yes [place], Modena Nunnery, MD  EPINEPHrine 0.3 mg/0.3 mL IJ SOAJ injection Inject 0.3 mLs (0.3 mg total) into the muscle once. 06/11/15  Yes [place], Modena Nunnery, MD  FLUoxetine (PROZAC) 20 MG tablet Take 1 tablet (20 mg total) by mouth daily. 07/06/16  Yes [place], Modena Nunnery, MD  furosemide (LASIX) 40 MG tablet Take 0.5 tablets (20 mg total) by mouth 2 (two) times daily. Patient taking differently: Take 60 mg daily  by mouth.  11/12/16 12/22/16 Yes Rexene Alberts, MD  lactulose [facility]) 10 GM/15ML solution TAKE 22.5ML BY MOUTH TWICE DAILY 08/29/16  Yes [place], Modena Nunnery, MD  omeprazole (PRILOSEC) 20 MG capsule Take 1 capsule (20 mg total) by mouth 2 (two) times daily. 07/06/16  Yes [place], Modena Nunnery, MD  sildenafil (VIAGRA) 100 MG tablet Take 0.5 tablets (50 mg total) by mouth daily as needed for erectile dysfunction. Take 1/2 tablet ( 50 mg ) 30 minutes prior to sexual engagement. 09/07/16  Yes [place] Guild, MD  spironolactone (ALDACTONE) 50 MG tablet Take 0.5 tablets (25 mg total) by mouth daily. Patient taking differently: Take 50 mg daily by mouth.  11/12/16 12/22/16 Yes Rexene Alberts, MD  thiamine (VITAMIN B-1) 50 MG tablet Take by mouth daily.   Yes [provider]    Inpatient Medications: Scheduled Meds: . busPIRone  7.5 mg Oral BID  . carvedilol  6.25 mg Oral BID  . FLUoxetine  20 mg Oral Daily  . furosemide  60 mg Intravenous BID  . lactulose  10 g Oral BID  . rifaximin  550 mg Oral BID  . sodium chloride flush  3 mL Intravenous Q12H  . spironolactone  50 mg Oral Daily  . sucralfate  1 g Oral TID WC & HS  . thiamine  50 mg Oral Daily   Continuous Infusions: . sodium chloride 50 mL/hr at 12/23/16 0400  . sodium chloride    . diltiazem (CARDIZEM) infusion 5 mg/hr (12/23/16 0602)  . pantoprozole (PROTONIX) infusion 8 mg/hr (12/23/16 0400)   PRN Meds: sodium chloride, HYDROcodone-homatropine, sodium chloride flush  Allergies:    Allergies  Allergen Reactions  . Albumin (Human) Hives and Itching  . Bee Venom Anaphylaxis and Nausea Only  . Bee Venom Anaphylaxis, Nausea And Vomiting and Swelling    Throat swelling  . Other Hives    Pt states there was a fluid through IV he was getting while getting fluid taken off his stomach. It gave him hives    Social History:   Social History   Socioeconomic History  . Marital status: Divorced    Spouse name: Not on file  .  Number of children: 1  . Years of education: 63  . Highest education level: Not on file  Social Needs  . Financial resource strain: Not on file  . Food insecurity - worry: Not on file  . Food insecurity - inability: Not on file  . Transportation needs - medical: Not on file  . Transportation needs - non-medical: Not on file  Occupational History  . Occupation: disabled  Tobacco Use  . Smoking status: Never Smoker  . Smokeless tobacco: Former Systems developer    Types: Chew  Substance and Sexual Activity  . Alcohol use: No    Alcohol/week: 0.0 oz    Comment: quit - 5 months ago  . Drug use: No  . Sexual activity: Yes  Other Topics Concern  . Not on file  Social History  Narrative   ** Merged History Encounter **       Patient is right handed. Patient drinks about 2 cups of soda a day.    Family History:    Family History  Problem Relation Age of Onset  . Heart disease Mother   . Cancer Father        throat cancer  . Cancer Brother        melanoma  . Thyroid disease Sister      ROS:  Please see the history of present illness.  ROS  All other ROS reviewed and negative.     Physical Exam/Data:   Vitals:   12/23/16 0700 12/23/16 0715 12/23/16 0730 12/23/16 0745  BP: (!) 105/50 96/67 100/72 98/65  Pulse: 90 85 88 85  Resp: 17 14 (!) 25 18  Temp:      TempSrc:      SpO2: 95% 96% 93% 98%  Weight:      Height:        Intake/Output Summary (Last 24 hours) at 12/23/2016 0914 Last data filed at 12/23/2016 0400 Gross per 24 hour  Intake 519.59 ml  Output -  Net 519.59 ml   Filed Weights   12/22/16 2345 12/23/16 0450  Weight: 246 lb 4.1 oz (111.7 kg) 246 lb 4.1 oz (111.7 kg)   Body mass index is 30.78 kg/m.  General: Thin, pale, chronically ill-appearing HEENT: normal Lymph: no adenopathy Neck: no JVD Endocrine:  No thryomegaly Vascular: No carotid bruits; FA pulses 2+ bilaterally without bruits  Cardiac: Irregular; no murmur or gallop Lungs: Absent breath sounds  right middle and base, diminished in the right upper lobe, clear on the left, with poor inspiratory effort. Ext: 2+-3+ pitting pretibial edema Musculoskeletal:  No deformities, BUE and BLE strength normal and equal Skin: warm and dry  Neuro:  CNs 2-12 intact, no focal abnormalities noted Psych: Very anxious, talkative, emotional.  EKG:  The EKG was personally reviewed and demonstrates: Atrial fib with RVR heart rate of 187 bpm, poor voltage.  Telemetry:  Telemetry was personally reviewed and demonstrates: Rate controlled atrial fibrillation  Relevant CV Studies:  Echocardiogram 06/02/2016 Left ventricle: The cavity size was normal. Wall thickness was   increased in a pattern of mild LVH. Systolic function was mildly   to moderately reduced. The estimated ejection fraction was in the   range of 40% to 45%. Diastolic function is abnormal,   indeterminate grade. - Regional wall motion abnormality: Hypokinesis of the basal-mid   anteroseptal myocardium. - Aortic valve: Valve area (VTI): 3.05 cm^2. Valve area (Vmax):   2.98 cm^2. - Mitral valve: Mildly calcified annulus. Mildly thickened leaflets. - Left atrium: The atrium was severely dilated. - Right atrium: The atrium was severely dilated. - Technically difficult study.  Laboratory Data:  Chemistry Recent Labs  Lab 12/22/16 1957 12/23/16 0427  NA 136 137  K 3.6 4.4  CL 103 104  CO2 22 24  GLUCOSE 136* 116*  BUN 12 13  CREATININE 0.91 1.16  CALCIUM 7.4* 7.3*  GFRNONAA >60 >60  GFRAA >60 >60  ANIONGAP 11 9    Recent Labs  Lab 12/22/16 1957 12/23/16 0427  PROT 6.8 5.9*  ALBUMIN 2.4* 2.1*  AST 63* 60*  ALT 23 22  ALKPHOS 195* 160*  BILITOT 1.8* 1.8*   Hematology Recent Labs  Lab 12/22/16 1957 12/23/16 0427  WBC 5.6 4.8  RBC 3.57* 3.13*  HGB 12.6* 11.2*  HCT 37.4* 33.4*  MCV 104.8* 106.7*  MCH 35.3* 35.8*  MCHC 33.7 33.5  RDW 15.0 15.3  PLT 100* 76*   Cardiac Enzymes Recent Labs  Lab 12/22/16 1957  12/23/16 0427  TROPONINI <0.03 <0.03   No results for input(s): TROPIPOC in the last 168 hours.  BNP Recent Labs  Lab 12/22/16 1957  BNP 331.0*     Radiology/Studies:  Ct Chest Wo Contrast  Result Date: 12/23/2016 CLINICAL DATA:  Pleural effusion EXAM: CT CHEST WITHOUT CONTRAST TECHNIQUE: Multidetector CT imaging of the chest was performed following the standard protocol without IV contrast. COMPARISON:  Chest radiograph 12/22/2016 Chest CT 07/18/2016 FINDINGS: Cardiovascular: Heart size is normal. No pericardial effusion. There are coronary artery calcifications. Normal course and caliber of the aorta. Mild aortic calcific atherosclerosis. Mediastinum/Nodes: The mediastinum is shifted to the left. There is no mediastinal adenopathy. The thyroid gland is normal. Lungs/Pleura: There is a massive right pleural effusion occupying nearly all of the right hemithorax. There is minimal maintained right upper lobe aeration. There is complete collapse of the right middle and lower lobes. The left lung is clear. Upper Abdomen: The liver is shrunken and nodular with large volume perihepatic ascites. There is moderate volume splenic ascites. Visualized portions of the kidneys are normal. Normal adrenal glands. The visible pancreas is unremarkable. Musculoskeletal: No chest wall mass or suspicious bone lesions identified. IMPRESSION: 1. Massive right pleural effusion occupying approximately 90% of the right hemithoracic volume and causing leftward shift of mediastinal structures. There is mild persistent aeration of the right upper lobe, but the right middle and lower lobes are completely collapsed. 2. Shrunken, nodular liver with large volume upper abdominal ascites, consistent with hepatic cirrhosis. 3. Coronary artery and aortic atherosclerosis (ICD10-I70.0). Electronically Signed   By: Ulyses Jarred M.D.   On: 12/23/2016 00:10   Dg Chest Port 1 View  Result Date: 12/22/2016 CLINICAL DATA:  Cough for 4 days.  EXAM: PORTABLE CHEST 1 VIEW COMPARISON:  Chest radiograph 12/06/2016. FINDINGS: Monitoring leads overlie the patient. Stable cardiomegaly. Interval increase in size of large right pleural effusion. Left lung is clear. Minimal aerated pulmonary tissue right lung apex. No pneumothorax. IMPRESSION: Interval increase in size of large right pleural effusion with minimal aerated pulmonary parenchyma right lung apex. Cardiomegaly. Electronically Signed   By: Lovey Newcomer M.D.   On: 12/22/2016 20:21    Assessment and Plan:   1.  Recurrent large right pleural effusion: Patient has massive pleural effusion obscuring the right middle and lower lobe, with collapse.  The patient is planned for thoracentesis this a.m.  Awaiting interventional radiology for timing.  Recent thoracentesis on December 06, 2016 removing 4 L.  This is complicated by alcoholic cirrhosis.  2.  Chronic atrial fibrillation, presenting with RVR: Heart rate of 187 bpm on admission.  Has been placed on IV diltiazem drip at 5 mg an hour.  Heart rate is much better controlled, not a candidate for anticoagulation in the setting of peptic ulcer disease and frequent GI bleeding.  He also is thrombocytopenic.  He remains on carvedilol 6.25 mg twice daily.  3.  Acute on chronic systolic heart failure: He has been given IV Lasix, no documentation of urine output at this time.  Admission weight 246 pounds.  Weight on most recent discharge, 203 pounds.  He is now on Lasix 60 mg p.o. daily.  Recommended changing to IV Lasix 60 mg IV twice daily.  He also continues on spironolactone 50 mg daily.Will have limited echo completed this am to evaluate for LV  fx change in this setting. Unable to clearly see cardiac silhouette on  CT or CXR.   4.  Recent reported hematemesis: He endorses hemataemesis over the last 24 hours.  He is thrombocytopenic, hemoglobin has dropped 1 g since admission.  He is not on anticoagulation in the setting of atrial fibrillation with  history of GI bleed in the past.  Workup per primary team.  5.  Anxiety and depression: Significant amount noted on assessment.  However, coupled with this, mother is also a patient here in ICU.  She is being discharged to hospice and this is very upsetting to him. Nurses are arranging for him to see her before she goes home as she is not expected to survive.   For questions or updates, please contact [place] Please consult [email or web address] for contact info under Cardiology/STEMI.   Signed, Jory Sims DNP, ANP, AACC 12/23/2016 9:14 AM    Attending note:  Patient seen and examined.  Reviewed records and updated the chart.  Case discussed with Ms. West Pugh. Mr. Rainey presents with increasing shortness of breath and palpitations, found to have a large recurrent right pleural effusion and also rapid heart rates in atrial fibrillation which is chronic.  He is fluid overloaded with ascites and leg edema in the setting of alcoholic cirrhosis.  On examination in the ICU, currently undergoing echocardiogram, he reports improved shortness of breath with better heart rate control and intravenous diltiazem.  Lungs exhibit absent breath sounds on the right above mid lung zone.  Cardiac exam reveals irregularly irregular distant heart sounds without obvious gallop.  He has elevated JVP.  Abdomen is protuberant and he has chronic appearing leg edema.  Lab work reveals BUN 13, creatinine 1.16, negative troponin I levels, BNP 331, hemoglobin 11.2, platelets 76, protein 1.8, albumin 2.1.  I personally reviewed telemetry which shows rate controlled atrial fibrillation.  Personally reviewed his ECG from 12/22/2016 which showed rapid atrial fibrillation in the 180s with low voltage and poor R wave progression.  Chest CT imaging demonstrates a massive right pleural effusion resulting in leftward shift of mediastinal structures and collapse of the right middle and lower lobes.  No pericardial effusion  demonstrated.  Last echocardiogram in April of this year revealed LVEF 40-45% with severe biatrial enlargement.  Patient presents with rapid atrial fibrillation and recurrent large right pleural effusion in the setting of alcoholic cirrhosis.  He has low protein stores and is thrombocytopenic.  Heart rate is much better controlled on intravenous diltiazem which should be continued for the time being.  He had been on Coreg as an outpatient, previously with good heart rate control.  Plan is for a right-sided thoracentesis, hopefully as this process resolves his heart rate control will be less of an issue and he can come back off of calcium channel blocker.  We are repeating an echocardiogram to reassess LVEF.  As noted previously, he is a poor candidate for anticoagulation.  Satira Sark, M.D., F.A.C.C.

## 2016-12-23 NOTE — Progress Notes (Signed)
PROGRESS NOTE    [name]  OFB:[id number]  DOB: November 25, 1960  DOA: 12/22/2016 PCP: [name], MD   Brief Admission Hx: [name]  is a 56 y.o. male, w Hypertension, Hyperlipidemia, CAD, Stroke, Cirrhosis, Recurrent R pleural effusion, apparently c/o increase in dyspnea.  Pt presented to ED with palpitations . He also complained of slight chest pain substernal , "sharp"  Without radiation.  Slight nausea  Slight emesis, pt noted slight blood.  Denies fever, chills, abd pain, diarrhea, brbpr, black stool, dysuria, hematuria.  Pt noted to have large right pleural effusion.    MDM/Assessment & Plan:   1. Atrial Fibrillation with RVR - Pt was started on IV diltiazem infusion with good results.  Anticoagulation was held secondary to concerns for hematemesis and GI bleeding.  He does have a history of bleeding upper GI ulcers. 2. Hematemesis - concern about UGI bleeding, IV protonix.  Keep NPO, GI consult pending.  3. Atypical chest pain-likely secondary to A. fib with RVR, following troponins.  Continue carvedilol.  Monitor on telemetry. 4. Large right pleural effusion-this is recurrent and will be treated with an ultrasound-guided thoracentesis. 5. Hepatic encephalopathy- resume lactulose, added rifaximin 6. Chronic depression-resume home Prozac and BuSpar.  DVT prophylaxis: SCDs Code Status: DNR Family Communication: none present during rounds Disposition Plan: TBD  Consultants:  GI  Cardiology  Subjective: Patient sitting up in the bed and reports that he does feel better and breathing better.  No further hematemesis.  Objective: Vitals:   12/23/16 0504 12/23/16 0515 12/23/16 0530 12/23/16 0545  BP: (!) 95/54 90/60 93/64  92/60  Pulse: 79 78 80 81  Resp: 15 16 16 16   Temp:      TempSrc:      SpO2: 91% 91% 92% 91%  Weight:      Height:        Intake/Output Summary (Last 24 hours) at 12/23/2016 7824 Last data filed at 12/23/2016 0400 Gross per 24 hour    Intake 519.59 ml  Output -  Net 519.59 ml   Filed Weights   12/22/16 2345 12/23/16 0450  Weight: 111.7 kg (246 lb 4.1 oz) 111.7 kg (246 lb 4.1 oz)     REVIEW OF SYSTEMS  As per history otherwise all reviewed and reported negative  Exam:  General exam: Chronically ill-appearing male, awake alert in no distress cooperative and pleasant. Respiratory system: Clear. No increased work of breathing. Cardiovascular system: S1 & S2 heard, irregular. No  murmurs, gallops, clicks. Gastrointestinal system: Abdomen is distended, soft and nontender. Normal bowel sounds heard. Central nervous system: Alert and oriented. No focal neurological deficits. Extremities: 1-2+ edema bilateral lower extremities 1+ edema bilateral upper extremity  Data Reviewed: Basic Metabolic Panel: Recent Labs  Lab 12/22/16 1957  NA 136  K 3.6  CL 103  CO2 22  GLUCOSE 136*  BUN 12  CREATININE 0.91  CALCIUM 7.4*   Liver Function Tests: Recent Labs  Lab 12/22/16 1957  AST 63*  ALT 23  ALKPHOS 195*  BILITOT 1.8*  PROT 6.8  ALBUMIN 2.4*   Recent Labs  Lab 12/22/16 1957  LIPASE 40   No results for input(s): AMMONIA in the last 168 hours. CBC: Recent Labs  Lab 12/22/16 1957  WBC 5.6  NEUTROABS 3.1  HGB 12.6*  HCT 37.4*  MCV 104.8*  PLT 100*   Cardiac Enzymes: Recent Labs  Lab 12/22/16 1957  TROPONINI <0.03   CBG (last 3)  No results for input(s): GLUCAP in  the last 72 hours. No results found for this or any previous visit (from the past 240 hour(s)).   Studies: Ct Chest Wo Contrast  Result Date: 12/23/2016 CLINICAL DATA:  Pleural effusion EXAM: CT CHEST WITHOUT CONTRAST TECHNIQUE: Multidetector CT imaging of the chest was performed following the standard protocol without IV contrast. COMPARISON:  Chest radiograph 12/22/2016 Chest CT 07/18/2016 FINDINGS: Cardiovascular: Heart size is normal. No pericardial effusion. There are coronary artery calcifications. Normal course and caliber  of the aorta. Mild aortic calcific atherosclerosis. Mediastinum/Nodes: The mediastinum is shifted to the left. There is no mediastinal adenopathy. The thyroid gland is normal. Lungs/Pleura: There is a massive right pleural effusion occupying nearly all of the right hemithorax. There is minimal maintained right upper lobe aeration. There is complete collapse of the right middle and lower lobes. The left lung is clear. Upper Abdomen: The liver is shrunken and nodular with large volume perihepatic ascites. There is moderate volume splenic ascites. Visualized portions of the kidneys are normal. Normal adrenal glands. The visible pancreas is unremarkable. Musculoskeletal: No chest wall mass or suspicious bone lesions identified. IMPRESSION: 1. Massive right pleural effusion occupying approximately 90% of the right hemithoracic volume and causing leftward shift of mediastinal structures. There is mild persistent aeration of the right upper lobe, but the right middle and lower lobes are completely collapsed. 2. Shrunken, nodular liver with large volume upper abdominal ascites, consistent with hepatic cirrhosis. 3. Coronary artery and aortic atherosclerosis (ICD10-I70.0). Electronically Signed   By: Ulyses Jarred M.D.   On: 12/23/2016 00:10   Dg Chest Port 1 View  Result Date: 12/22/2016 CLINICAL DATA:  Cough for 4 days. EXAM: PORTABLE CHEST 1 VIEW COMPARISON:  Chest radiograph 12/06/2016. FINDINGS: Monitoring leads overlie the patient. Stable cardiomegaly. Interval increase in size of large right pleural effusion. Left lung is clear. Minimal aerated pulmonary tissue right lung apex. No pneumothorax. IMPRESSION: Interval increase in size of large right pleural effusion with minimal aerated pulmonary parenchyma right lung apex. Cardiomegaly. Electronically Signed   By: Lovey Newcomer M.D.   On: 12/22/2016 20:21   Scheduled Meds: . busPIRone  7.5 mg Oral BID  . carvedilol  6.25 mg Oral BID  . FLUoxetine  20 mg Oral  Daily  . furosemide  60 mg Oral Daily  . lactulose  10 g Oral BID  . rifaximin  550 mg Oral BID  . sodium chloride flush  3 mL Intravenous Q12H  . spironolactone  50 mg Oral Daily  . sucralfate  1 g Oral TID WC & HS  . thiamine  50 mg Oral Daily   Continuous Infusions: . sodium chloride 50 mL/hr at 12/23/16 0400  . sodium chloride    . diltiazem (CARDIZEM) infusion 5 mg/hr (12/23/16 0602)  . pantoprozole (PROTONIX) infusion 8 mg/hr (12/23/16 0400)    Principal Problem:   Atrial fibrillation (HCC) Active Problems:   Atrial fibrillation with RVR (HCC)   Pleural effusion, right   Thrombocytopenia (HCC)   Anemia   Abnormal liver function   Critical Care Time spent: 72 mins  [name], MD, FAAFP [facility] Pager [phone]  If 7PM-7AM, please contact night-coverage [email or web address] Password [email or web address] 12/23/2016, 6:38 AM    LOS: 1 day

## 2016-12-24 LAB — COMPREHENSIVE METABOLIC PANEL
ALBUMIN: 2 g/dL — AB (ref 3.5–5.0)
ALT: 21 U/L (ref 17–63)
ANION GAP: 7 (ref 5–15)
AST: 58 U/L — ABNORMAL HIGH (ref 15–41)
Alkaline Phosphatase: 169 U/L — ABNORMAL HIGH (ref 38–126)
BUN: 19 mg/dL (ref 6–20)
CO2: 25 mmol/L (ref 22–32)
Calcium: 7.4 mg/dL — ABNORMAL LOW (ref 8.9–10.3)
Chloride: 104 mmol/L (ref 101–111)
Creatinine, Ser: 1.09 mg/dL (ref 0.61–1.24)
GFR calc Af Amer: >60 mL/min (ref >60)
GFR calc non Af Amer: >60 mL/min (ref >60)
GLUCOSE: 108 mg/dL — AB (ref 65–99)
POTASSIUM: 3.6 mmol/L (ref 3.5–5.1)
Sodium: 136 mmol/L (ref 135–145)
Total Bilirubin: 1.8 mg/dL — ABNORMAL HIGH (ref 0.3–1.2)
Total Protein: 5.9 g/dL — ABNORMAL LOW (ref 6.5–8.1)

## 2016-12-24 LAB — CBC
HEMATOCRIT: 31.9 % — AB (ref 39.0–52.0)
HEMOGLOBIN: 10.9 g/dL — AB (ref 13.0–17.0)
MCH: 36 pg — AB (ref 26.0–34.0)
MCHC: 34.2 g/dL (ref 30.0–36.0)
MCV: 105.3 fL — ABNORMAL HIGH (ref 78.0–100.0)
Platelets: 48 10*3/uL — ABNORMAL LOW (ref 150–400)
RBC: 3.03 MIL/uL — AB (ref 4.22–5.81)
RDW: 15.2 % (ref 11.5–15.5)
WBC: 3.8 10*3/uL — ABNORMAL LOW (ref 4.0–10.5)

## 2016-12-24 LAB — AMMONIA: AMMONIA: 76 umol/L — AB (ref 9–35)

## 2016-12-24 MED ORDER — METHYLPREDNISOLONE SODIUM SUCC 40 MG IJ SOLR
40.0000 mg | Freq: Once | INTRAMUSCULAR | Status: AC
  Administered 2016-12-24: 40 mg via INTRAVENOUS
  Filled 2016-12-24: qty 1

## 2016-12-24 MED ORDER — DIPHENHYDRAMINE HCL 25 MG PO CAPS
50.0000 mg | ORAL_CAPSULE | Freq: Once | ORAL | Status: AC
  Administered 2016-12-24: 50 mg via ORAL
  Filled 2016-12-24: qty 2

## 2016-12-24 MED ORDER — METHYLPREDNISOLONE SODIUM SUCC 40 MG IJ SOLR
40.0000 mg | Freq: Every day | INTRAMUSCULAR | Status: AC
  Administered 2016-12-25 – 2016-12-27 (×3): 40 mg via INTRAVENOUS
  Filled 2016-12-24 (×3): qty 1

## 2016-12-24 MED ORDER — CYCLOBENZAPRINE HCL 10 MG PO TABS
5.0000 mg | ORAL_TABLET | Freq: Once | ORAL | Status: AC
  Administered 2016-12-24: 5 mg via ORAL
  Filled 2016-12-24: qty 1

## 2016-12-24 MED ORDER — DIPHENHYDRAMINE HCL 25 MG PO CAPS
50.0000 mg | ORAL_CAPSULE | Freq: Every day | ORAL | Status: AC
  Administered 2016-12-25 – 2016-12-27 (×3): 50 mg via ORAL
  Filled 2016-12-24 (×3): qty 2

## 2016-12-24 MED ORDER — LACTULOSE 10 GM/15ML PO SOLN
10.0000 g | Freq: Three times a day (TID) | ORAL | Status: DC
  Administered 2016-12-24 – 2016-12-28 (×15): 10 g via ORAL
  Filled 2016-12-24 (×16): qty 30

## 2016-12-24 MED ORDER — ALBUMIN HUMAN 25 % IV SOLN
50.0000 g | Freq: Once | INTRAVENOUS | Status: AC
  Administered 2016-12-24: 50 g via INTRAVENOUS
  Filled 2016-12-24: qty 200

## 2016-12-24 MED ORDER — ALBUMIN HUMAN 25 % IV SOLN
50.0000 g | Freq: Every day | INTRAVENOUS | Status: AC
  Administered 2016-12-25 – 2016-12-27 (×3): 50 g via INTRAVENOUS
  Filled 2016-12-24 (×3): qty 200

## 2016-12-24 MED ORDER — ONDANSETRON HCL 4 MG/2ML IJ SOLN
4.0000 mg | Freq: Four times a day (QID) | INTRAMUSCULAR | Status: DC | PRN
  Administered 2016-12-24: 4 mg via INTRAVENOUS
  Filled 2016-12-24: qty 2

## 2016-12-24 NOTE — Progress Notes (Signed)
Premed given before albumen

## 2016-12-24 NOTE — Progress Notes (Signed)
  Subjective:  Patient denies nausea vomiting chest or abdominal pain.  He noted shortness of breath earlier today.  He is not short of breath now.  He does not feel that his urine output has increased.  He has had 2 bowel movements today.  Stool is loose and not melanic or red in color.  Objective: Blood pressure 100/66, pulse 84, temperature 98.1 F (36.7 C), temperature source Oral, resp. rate (!) 26, height 6\' 3"  (1.905 m), weight 235 lb 4.8 oz (106.7 kg), SpO2 93 %. Patient is alert and in no acute distress. He has tremors but no asterixis. Conjunctiva is pink. Sclera is nonicteric Oropharyngeal mucosa is normal. No neck masses or thyromegaly noted. Cardiac exam with iregular rhythm normal S1 and S2. No murmur or gallop noted. Breath sounds absent at right base. Abdomen is full with small umbilical hernia.  Abdominal wall edema noted.  Abdomen is nontender. He has 2+ pitting edema involving both legs to the level of inguinal region.  Urine output 1725 mL last 24 hours.   Labs/studies Results:  Recent Labs    01-21-2017 1957 12/23/16 0427 12/24/16 0741  WBC 5.6 4.8 3.8*  HGB 12.6* 11.2* 10.9*  HCT 37.4* 33.4* 31.9*  PLT 100* 76* 48*    BMET  Recent Labs    2017-01-21 1957 12/23/16 0427 12/24/16 0741  NA 136 137 136  K 3.6 4.4 3.6  CL 103 104 104  CO2 22 24 25   GLUCOSE 136* 116* 108*  BUN 12 13 19   CREATININE 0.91 1.16 1.09  CALCIUM 7.4* 7.3* 7.4*    LFT  Recent Labs    01-21-17 1957 12/23/16 0427 12/24/16 0741  PROT 6.8 5.9* 5.9*  ALBUMIN 2.4* 2.1* 2.0*  AST 63* 60* 58*  ALT 23 22 21   ALKPHOS 195* 160* 169*  BILITOT 1.8* 1.8* 1.8*    PT/INR  Recent Labs    Jan 21, 2017 1957  LABPROT 15.2  INR 1.20    Serum ammonia 76.  Urinary sodium pending.  Assessment:  #1.  Fluid overload.  He had therapeutic right-sided thoracenteses as well as LVAP.  He is not having brisk diuresis with furosemide and spironolactone.  Spironolactone dose was increased  yesterday.  Serum albumin is low.  Therefore IV albumin should help.  Previous reaction only has been whelp formation.  Discussed with Hart Robinsons of pharmacy. He will be premedicated with Benadryl p.o. and Solu-Medrol and monitored in ICU.  Also discussed with Dr. plan for Wynetta Emery. Patient not an ideal candidate for TIPS because of hepatic encephalopathy.  Will discuss with IR.  #2.  Decompensated alcoholic cirrhosis.  MELDd on admission was 10. Patient not a candidate for liver transplant because of cardiac disease and cardiomyopathy.  #3.  Hepatic encephalopathy.  Serum ammonia is mildly elevated.  Clinically he is not encephalopathic.  #4.  Anemia.  Anemia would appear to be predominantly due to chronic disease.  He had single episode of hematemesis one day prior to admission but no evidence of GI bleed.  Will plan EGD prior to discharge.   Recommendations:  IV albumin 50 g after premedicated with Benadryl 50 mg p.o. and Solu-Medrol 40 mg IV.  If he does not have any reaction today we will give him few more doses of IV albumin. Patient will be monitored in ICU day as discussed with Dr. Wynetta Emery. Repeat lab in a.m.

## 2016-12-24 NOTE — Progress Notes (Signed)
PROGRESS NOTE    [name]  FGH:[id number]  DOB: 07/24/1960  DOA: 12/22/2016 PCP: [name], MD   Brief Admission Hx: [name]  is a 56 y.o. male, w Hypertension, Hyperlipidemia, CAD, Stroke, Cirrhosis, Recurrent R pleural effusion, apparently c/o increase in dyspnea.  Pt presented to ED with palpitations . He also complained of slight chest pain substernal , "sharp"  Without radiation.  Slight nausea  Slight emesis, pt noted slight blood.  Denies fever, chills, abd pain, diarrhea, brbpr, black stool, dysuria, hematuria.  Pt noted to have large right pleural effusion.    MDM/Assessment & Plan:   1. Atrial Fibrillation with RVR - Pt was started on IV diltiazem infusion with good results.  He is now off the IV diltiazem.  Heart rate is much better controlled.  Anticoagulation was held secondary to concerns for hematemesis and GI bleeding.  He does have a history of bleeding upper GI ulcers. 2. Hematemesis - concern about UGI bleeding, IV protonix.  GI planning EGD prior to discharge.  3. Atypical chest pain-likely secondary to A. fib with RVR, following troponins.  Continue carvedilol. Resolved now.  4. Large right pleural effusion-this is recurrent and will be treated with an ultrasound-guided thoracentesis. 5. Ascites - s/p US paracentesis 11/9 with good results, feeling much better clinically.  6. Hepatic encephalopathy- resumed lactulose, added rifaximin, follow ammonia levels, noted to be elevated this morning.  Increase lactulose to TID.  7. Chronic depression-resume home Prozac and BuSpar.  DVT prophylaxis: SCDs Code Status: DNR Family Communication: none present during rounds Disposition Plan: TBD  Consultants:  GI  Cardiology  Subjective: Patient says he does feel better but has been nauseated since procedures yesterday.   Objective: Vitals:   12/23/16 0504 12/23/16 0515 12/23/16 0530 12/23/16 0545  BP: (!) 95/54 90/60 93/64  92/60  Pulse: 79 78 80 81   Resp: 15 16 16 16   Temp:      TempSrc:      SpO2: 91% 91% 92% 91%  Weight:      Height:        Intake/Output Summary (Last 24 hours) at 12/23/2016 6789 Last data filed at 12/23/2016 0400 Gross per 24 hour  Intake 519.59 ml  Output -  Net 519.59 ml   Filed Weights   12/22/16 2345 12/23/16 0450  Weight: 111.7 kg (246 lb 4.1 oz) 111.7 kg (246 lb 4.1 oz)     REVIEW OF SYSTEMS  As per history otherwise all reviewed and reported negative  Exam:  General exam: Chronically ill-appearing male, awake alert in no distress cooperative and pleasant. Respiratory system: Clear. No increased work of breathing. Cardiovascular system: S1 & S2 heard, irregular. No  murmurs, gallops, clicks. Gastrointestinal system: Abdomen is distended, soft and nontender. Normal bowel sounds heard. Central nervous system: Alert and oriented. No focal neurological deficits. Extremities: 1-2+ edema bilateral lower extremities 1+ edema bilateral upper extremity  Data Reviewed: Basic Metabolic Panel: Recent Labs  Lab 12/22/16 1957  NA 136  K 3.6  CL 103  CO2 22  GLUCOSE 136*  BUN 12  CREATININE 0.91  CALCIUM 7.4*   Liver Function Tests: Recent Labs  Lab 12/22/16 1957  AST 63*  ALT 23  ALKPHOS 195*  BILITOT 1.8*  PROT 6.8  ALBUMIN 2.4*   Recent Labs  Lab 12/22/16 1957  LIPASE 40   No results for input(s): AMMONIA in the last 168 hours. CBC: Recent Labs  Lab 12/22/16 1957  WBC 5.6  NEUTROABS  3.1  HGB 12.6*  HCT 37.4*  MCV 104.8*  PLT 100*   Cardiac Enzymes: Recent Labs  Lab 12/22/16 1957  TROPONINI <0.03   CBG (last 3)  No results for input(s): GLUCAP in the last 72 hours. No results found for this or any previous visit (from the past 240 hour(s)).   Studies: Ct Chest Wo Contrast  Result Date: 12/23/2016 CLINICAL DATA:  Pleural effusion EXAM: CT CHEST WITHOUT CONTRAST TECHNIQUE: Multidetector CT imaging of the chest was performed following the standard protocol without  IV contrast. COMPARISON:  Chest radiograph 12/22/2016 Chest CT 07/18/2016 FINDINGS: Cardiovascular: Heart size is normal. No pericardial effusion. There are coronary artery calcifications. Normal course and caliber of the aorta. Mild aortic calcific atherosclerosis. Mediastinum/Nodes: The mediastinum is shifted to the left. There is no mediastinal adenopathy. The thyroid gland is normal. Lungs/Pleura: There is a massive right pleural effusion occupying nearly all of the right hemithorax. There is minimal maintained right upper lobe aeration. There is complete collapse of the right middle and lower lobes. The left lung is clear. Upper Abdomen: The liver is shrunken and nodular with large volume perihepatic ascites. There is moderate volume splenic ascites. Visualized portions of the kidneys are normal. Normal adrenal glands. The visible pancreas is unremarkable. Musculoskeletal: No chest wall mass or suspicious bone lesions identified. IMPRESSION: 1. Massive right pleural effusion occupying approximately 90% of the right hemithoracic volume and causing leftward shift of mediastinal structures. There is mild persistent aeration of the right upper lobe, but the right middle and lower lobes are completely collapsed. 2. Shrunken, nodular liver with large volume upper abdominal ascites, consistent with hepatic cirrhosis. 3. Coronary artery and aortic atherosclerosis (ICD10-I70.0). Electronically Signed   By: Ulyses Jarred M.D.   On: 12/23/2016 00:10   Dg Chest Port 1 View  Result Date: 12/22/2016 CLINICAL DATA:  Cough for 4 days. EXAM: PORTABLE CHEST 1 VIEW COMPARISON:  Chest radiograph 12/06/2016. FINDINGS: Monitoring leads overlie the patient. Stable cardiomegaly. Interval increase in size of large right pleural effusion. Left lung is clear. Minimal aerated pulmonary tissue right lung apex. No pneumothorax. IMPRESSION: Interval increase in size of large right pleural effusion with minimal aerated pulmonary  parenchyma right lung apex. Cardiomegaly. Electronically Signed   By: Lovey Newcomer M.D.   On: 12/22/2016 20:21   Scheduled Meds: . busPIRone  7.5 mg Oral BID  . carvedilol  6.25 mg Oral BID  . FLUoxetine  20 mg Oral Daily  . furosemide  60 mg Oral Daily  . lactulose  10 g Oral BID  . rifaximin  550 mg Oral BID  . sodium chloride flush  3 mL Intravenous Q12H  . spironolactone  50 mg Oral Daily  . sucralfate  1 g Oral TID WC & HS  . thiamine  50 mg Oral Daily   Continuous Infusions: . sodium chloride 50 mL/hr at 12/23/16 0400  . sodium chloride    . diltiazem (CARDIZEM) infusion 5 mg/hr (12/23/16 0602)  . pantoprozole (PROTONIX) infusion 8 mg/hr (12/23/16 0400)    Principal Problem:   Atrial fibrillation (HCC) Active Problems:   Atrial fibrillation with RVR (HCC)   Pleural effusion, right   Thrombocytopenia (HCC)   Anemia   Abnormal liver function   Critical Care Time spent: 33 mins  [name], MD, FAAFP [facility] Pager [phone]  If 7PM-7AM, please contact night-coverage [email or web address] Password [email or web address] 12/23/2016, 6:38 AM    LOS: 1 day

## 2016-12-24 NOTE — Progress Notes (Signed)
Dr. [name] request that patient stay on ICU until he is given Albumen and then after 2 hours after he may be transfered

## 2016-12-25 ENCOUNTER — Inpatient Hospital Stay (HOSPITAL_COMMUNITY): Payer: Medicare Other

## 2016-12-25 LAB — COMPREHENSIVE METABOLIC PANEL
ALK PHOS: 147 U/L — AB (ref 38–126)
ALT: 20 U/L (ref 17–63)
ANION GAP: 7 (ref 5–15)
AST: 52 U/L — ABNORMAL HIGH (ref 15–41)
Albumin: 2.3 g/dL — ABNORMAL LOW (ref 3.5–5.0)
BILIRUBIN TOTAL: 2.2 mg/dL — AB (ref 0.3–1.2)
BUN: 16 mg/dL (ref 6–20)
CALCIUM: 7.8 mg/dL — AB (ref 8.9–10.3)
CO2: 29 mmol/L (ref 22–32)
Chloride: 102 mmol/L (ref 101–111)
Creatinine, Ser: 0.89 mg/dL (ref 0.61–1.24)
Glucose, Bld: 143 mg/dL — ABNORMAL HIGH (ref 65–99)
Potassium: 3.7 mmol/L (ref 3.5–5.1)
Sodium: 138 mmol/L (ref 135–145)
TOTAL PROTEIN: 6 g/dL — AB (ref 6.5–8.1)

## 2016-12-25 LAB — CBC
HCT: 32.5 % — ABNORMAL LOW (ref 39.0–52.0)
HEMOGLOBIN: 11.2 g/dL — AB (ref 13.0–17.0)
MCH: 36.5 pg — ABNORMAL HIGH (ref 26.0–34.0)
MCHC: 34.5 g/dL (ref 30.0–36.0)
MCV: 105.9 fL — ABNORMAL HIGH (ref 78.0–100.0)
Platelets: 30 10*3/uL — ABNORMAL LOW (ref 150–400)
RBC: 3.07 MIL/uL — AB (ref 4.22–5.81)
RDW: 15.2 % (ref 11.5–15.5)
WBC: 4.1 10*3/uL (ref 4.0–10.5)

## 2016-12-25 LAB — AMMONIA: AMMONIA: 71 umol/L — AB (ref 9–35)

## 2016-12-25 MED ORDER — DILTIAZEM HCL 30 MG PO TABS
30.0000 mg | ORAL_TABLET | Freq: Three times a day (TID) | ORAL | Status: DC
  Administered 2016-12-25 – 2016-12-29 (×13): 30 mg via ORAL
  Filled 2016-12-25 (×13): qty 1

## 2016-12-25 NOTE — Progress Notes (Signed)
PROGRESS NOTE    [name]  OJJ:[id number]  DOB: 07/31/1960  DOA: 12/22/2016 PCP: [name], MD   Brief Admission Hx: [name]  is a 56 y.o. male, w Hypertension, Hyperlipidemia, CAD, Stroke, Cirrhosis, Recurrent R pleural effusion, apparently c/o increase in dyspnea.  Pt presented to ED with palpitations . He also complained of slight chest pain substernal , "sharp"  Without radiation.  Slight nausea  Slight emesis, pt noted slight blood.  Denies fever, chills, abd pain, diarrhea, brbpr, black stool, dysuria, hematuria.  Pt noted to have large right pleural effusion.    MDM/Assessment & Plan:   1. Atrial Fibrillation with RVR - Pt was started on IV diltiazem infusion with good results.  He is now off the IV diltiazem.  Heart rate was much better controlled but now he is becoming tachycardic again with HR fluctuating in the 110-140 range. Will add oral diltiazem but if this doesn't work, he likely will need to go back on IV diltiazem infusion as that did work well.  Keep in stepdown unit for now in anticipation for possible .  Anticoagulation was held secondary to concerns for hematemesis and GI bleeding.  He does have a history of bleeding upper GI ulcers. 2. Hematemesis - GI planning EGD prior to discharge.  3. Atypical chest pain-likely secondary to A. fib with RVR, following troponins.  Continue carvedilol. Resolved now.  4. Large right pleural effusion-this is recurrent and was treated with an ultrasound-guided thoracentesis but it remains large, may need another thoracentesis tomorrow as he is more symptomatic today.  5. Ascites - s/p US paracentesis 11/9 with good results, feeling much better clinically.  6. Hepatic encephalopathy- resumed lactulose, added rifaximin, follow ammonia levels, noted to be elevated this morning.  Increase lactulose to TID.  7. Chronic depression-resume home Prozac and BuSpar.  DVT prophylaxis: SCDs Code Status: DNR Family Communication:  none present during rounds Disposition Plan: TBD  Consultants:  GI  Cardiology  Subjective: Patient having more SOB says it feels like pleural effusion reaccumulating.   Objective: Vitals:   12/23/16 0504 12/23/16 0515 12/23/16 0530 12/23/16 0545  BP: (!) 95/54 90/60 93/64  92/60  Pulse: 79 78 80 81  Resp: 15 16 16 16   Temp:      TempSrc:      SpO2: 91% 91% 92% 91%  Weight:      Height:        Intake/Output Summary (Last 24 hours) at 12/23/2016 9371 Last data filed at 12/23/2016 0400 Gross per 24 hour  Intake 519.59 ml  Output -  Net 519.59 ml   Filed Weights   12/22/16 2345 12/23/16 0450  Weight: 111.7 kg (246 lb 4.1 oz) 111.7 kg (246 lb 4.1 oz)     REVIEW OF SYSTEMS  As per history otherwise all reviewed and reported negative  Exam:  General exam: Chronically ill-appearing male, awake alert in no distress cooperative and pleasant. Respiratory system: diminished BS on right.  No increased work of breathing. Cardiovascular system: S1 & S2 heard, irregular. No  murmurs, gallops, clicks. Gastrointestinal system: Abdomen is distended, soft and nontender. Normal bowel sounds heard. Central nervous system: Alert and oriented. No focal neurological deficits. Extremities: 1-2+ edema bilateral lower extremities 1+ edema bilateral upper extremity  Data Reviewed: Basic Metabolic Panel: Recent Labs  Lab 12/22/16 1957  NA 136  K 3.6  CL 103  CO2 22  GLUCOSE 136*  BUN 12  CREATININE 0.91  CALCIUM 7.4*   Liver  Function Tests: Recent Labs  Lab 12/22/16 1957  AST 63*  ALT 23  ALKPHOS 195*  BILITOT 1.8*  PROT 6.8  ALBUMIN 2.4*   Recent Labs  Lab 12/22/16 1957  LIPASE 40   No results for input(s): AMMONIA in the last 168 hours. CBC: Recent Labs  Lab 12/22/16 1957  WBC 5.6  NEUTROABS 3.1  HGB 12.6*  HCT 37.4*  MCV 104.8*  PLT 100*   Cardiac Enzymes: Recent Labs  Lab 12/22/16 1957  TROPONINI <0.03   CBG (last 3)  No results for input(s):  GLUCAP in the last 72 hours. No results found for this or any previous visit (from the past 240 hour(s)).   Studies: Ct Chest Wo Contrast  Result Date: 12/23/2016 CLINICAL DATA:  Pleural effusion EXAM: CT CHEST WITHOUT CONTRAST TECHNIQUE: Multidetector CT imaging of the chest was performed following the standard protocol without IV contrast. COMPARISON:  Chest radiograph 12/22/2016 Chest CT 07/18/2016 FINDINGS: Cardiovascular: Heart size is normal. No pericardial effusion. There are coronary artery calcifications. Normal course and caliber of the aorta. Mild aortic calcific atherosclerosis. Mediastinum/Nodes: The mediastinum is shifted to the left. There is no mediastinal adenopathy. The thyroid gland is normal. Lungs/Pleura: There is a massive right pleural effusion occupying nearly all of the right hemithorax. There is minimal maintained right upper lobe aeration. There is complete collapse of the right middle and lower lobes. The left lung is clear. Upper Abdomen: The liver is shrunken and nodular with large volume perihepatic ascites. There is moderate volume splenic ascites. Visualized portions of the kidneys are normal. Normal adrenal glands. The visible pancreas is unremarkable. Musculoskeletal: No chest wall mass or suspicious bone lesions identified. IMPRESSION: 1. Massive right pleural effusion occupying approximately 90% of the right hemithoracic volume and causing leftward shift of mediastinal structures. There is mild persistent aeration of the right upper lobe, but the right middle and lower lobes are completely collapsed. 2. Shrunken, nodular liver with large volume upper abdominal ascites, consistent with hepatic cirrhosis. 3. Coronary artery and aortic atherosclerosis (ICD10-I70.0). Electronically Signed   By: Ulyses Jarred M.D.   On: 12/23/2016 00:10   Dg Chest Port 1 View  Result Date: 12/22/2016 CLINICAL DATA:  Cough for 4 days. EXAM: PORTABLE CHEST 1 VIEW COMPARISON:  Chest radiograph  12/06/2016. FINDINGS: Monitoring leads overlie the patient. Stable cardiomegaly. Interval increase in size of large right pleural effusion. Left lung is clear. Minimal aerated pulmonary tissue right lung apex. No pneumothorax. IMPRESSION: Interval increase in size of large right pleural effusion with minimal aerated pulmonary parenchyma right lung apex. Cardiomegaly. Electronically Signed   By: Lovey Newcomer M.D.   On: 12/22/2016 20:21   Scheduled Meds: . busPIRone  7.5 mg Oral BID  . carvedilol  6.25 mg Oral BID  . FLUoxetine  20 mg Oral Daily  . furosemide  60 mg Oral Daily  . lactulose  10 g Oral BID  . rifaximin  550 mg Oral BID  . sodium chloride flush  3 mL Intravenous Q12H  . spironolactone  50 mg Oral Daily  . sucralfate  1 g Oral TID WC & HS  . thiamine  50 mg Oral Daily   Continuous Infusions: . sodium chloride 50 mL/hr at 12/23/16 0400  . sodium chloride    . diltiazem (CARDIZEM) infusion 5 mg/hr (12/23/16 0602)  . pantoprozole (PROTONIX) infusion 8 mg/hr (12/23/16 0400)    Principal Problem:   Atrial fibrillation (HCC) Active Problems:   Atrial fibrillation with RVR ([place])  Pleural effusion, right   Thrombocytopenia (HCC)   Anemia   Abnormal liver function   Critical Care Time spent: 31 mins  [name], MD, FAAFP [facility] Pager [phone]  If 7PM-7AM, please contact night-coverage [email or web address] Password [email or web address] 12/23/2016, 6:38 AM    LOS: 1 day

## 2016-12-25 NOTE — Progress Notes (Signed)
.  nr  Subjective:  Patient continues to complain of shortness of breath.  He feels he may need to have fluid removed tomorrow.  He denies chest pain.  He has good appetite.  He is having soft brown stools daily.  He has had 2 bowel movements today.  No rectal bleeding or melena reported.  Objective: Blood pressure 131/81, pulse 91, temperature 98.2 F (36.8 C), temperature source Oral, resp. rate (!) 22, height 6\' 3"  (1.905 m), weight 228 lb 12.8 oz (103.8 kg), SpO2 95 %. Patient is alert and in no acute distress. He has pronounced tremors but no asterixis. Breath sounds absent involving two thirds of right lung base. Abdomen is distended and somewhat tense.  Small umbilical hernia. LE edema 2+.  LE edema has decreased.  Urine output  2950 mL last 24 hours.   Labs/studies Results:   Recent Labs    12/23/16 0427 12/24/16 0741 12/25/16 0352  WBC 4.8 3.8* 4.1  HGB 11.2* 10.9* 11.2*  HCT 33.4* 31.9* 32.5*  PLT 76* 48* 30*    BMET   Recent Labs    12/23/16 0427 12/24/16 0741 12/25/16 0352  NA 137 136 138  K 4.4 3.6 3.7  CL 104 104 102  CO2 24 25 29   GLUCOSE 116* 108* 143*  BUN 13 19 16   CREATININE 1.16 1.09 0.89  CALCIUM 7.3* 7.4* 7.8*    LFT   Recent Labs    12/23/16 0427 12/24/16 0741 12/25/16 0352  PROT 5.9* 5.9* 6.0*  ALBUMIN 2.1* 2.0* 2.3*  AST 60* 58* 52*  ALT 22 21 20   ALKPHOS 160* 169* 147*  BILITOT 1.8* 1.8* 2.2*    PT/INR   Recent Labs    12/22/16 1957  LABPROT 15.2  INR 1.20    Serum ammonia 71.  Urinary sodium still pending.   Assessment:  #1.  Fluid overload.  He has started to diurese since he was begun on IV albumin which he tolerated without any side effects.  He is being premedicated with Benadryl and Solu-Medrol.  As far as fluid overload is concerned he has a long way to go. Will discuss with IR tomorrow reach TIPS.  Presence of hepatic encephalopathy makes him a poor candidate.  #2.  Decompensated alcoholic cirrhosis.  MELD  on admission was 10. Patient not a candidate for liver transplant due to underlying cardiac disease and cardiomyopathy.  #3.  Hepatic encephalopathy.  Serum ammonia bands mildly elevated.  Clinically he is not encephalopathic.  #4.  Anemia.  Anemia would appear to be predominantly due to chronic disease.  He had single episode of hematemesis one day prior to admission but no evidence of GI bleed.  Patient will need EGD prior to discharge.  He is not ready for for this study.   Recommendations:  Continue IV albumin for 2 more doses. Consult IR for consideration of TIPS. Repeat lab in a.m.

## 2016-12-26 ENCOUNTER — Inpatient Hospital Stay (HOSPITAL_COMMUNITY): Payer: Medicare Other

## 2016-12-26 LAB — COMPREHENSIVE METABOLIC PANEL
ALK PHOS: 132 U/L — AB (ref 38–126)
ALT: 19 U/L (ref 17–63)
AST: 46 U/L — AB (ref 15–41)
Albumin: 2.6 g/dL — ABNORMAL LOW (ref 3.5–5.0)
Anion gap: 9 (ref 5–15)
BILIRUBIN TOTAL: 2.1 mg/dL — AB (ref 0.3–1.2)
BUN: 20 mg/dL (ref 6–20)
CALCIUM: 7.9 mg/dL — AB (ref 8.9–10.3)
CO2: 29 mmol/L (ref 22–32)
Chloride: 99 mmol/L — ABNORMAL LOW (ref 101–111)
Creatinine, Ser: 0.92 mg/dL (ref 0.61–1.24)
GFR calc Af Amer: >60 mL/min (ref >60)
GLUCOSE: 176 mg/dL — AB (ref 65–99)
POTASSIUM: 3.4 mmol/L — AB (ref 3.5–5.1)
Sodium: 137 mmol/L (ref 135–145)
TOTAL PROTEIN: 5.9 g/dL — AB (ref 6.5–8.1)

## 2016-12-26 LAB — PH, BODY FLUID: pH, Body Fluid: 7.6

## 2016-12-26 LAB — CBC
HEMATOCRIT: 33.4 % — AB (ref 39.0–52.0)
Hemoglobin: 11.2 g/dL — ABNORMAL LOW (ref 13.0–17.0)
MCH: 35.8 pg — ABNORMAL HIGH (ref 26.0–34.0)
MCHC: 33.5 g/dL (ref 30.0–36.0)
MCV: 106.7 fL — ABNORMAL HIGH (ref 78.0–100.0)
PLATELETS: 30 10*3/uL — AB (ref 150–400)
RBC: 3.13 MIL/uL — ABNORMAL LOW (ref 4.22–5.81)
RDW: 15.5 % (ref 11.5–15.5)
WBC: 6.6 10*3/uL (ref 4.0–10.5)

## 2016-12-26 LAB — MAGNESIUM: MAGNESIUM: 1.4 mg/dL — AB (ref 1.7–2.4)

## 2016-12-26 LAB — AMMONIA: Ammonia: 39 umol/L — ABNORMAL HIGH (ref 9–35)

## 2016-12-26 LAB — PATHOLOGIST SMEAR REVIEW

## 2016-12-26 MED ORDER — MAGNESIUM SULFATE 2 GM/50ML IV SOLN
2.0000 g | Freq: Once | INTRAVENOUS | Status: AC
  Administered 2016-12-26: 2 g via INTRAVENOUS
  Filled 2016-12-26: qty 50

## 2016-12-26 NOTE — Progress Notes (Signed)
  Subjective:  Patient states his breathing has not improved even though he is passing urine frequently.  He reports significant decrease in LE edema.  He feels his abdomen has not much changed.  He remains with good appetite.  He denies nausea vomiting or abdominal pain.  He also denies melena or rectal bleeding.  Objective: Blood pressure 125/78, pulse (!) 103, temperature 98 F (36.7 C), temperature source Oral, resp. rate (!) 23, height 6\' 3"  (1.905 m), weight 226 lb 0.7 oz (92.1 kg), SpO2 96 %. Patient is alert and remains with prominent tremors but no asterixis. Breath sounds normal over left lung.  Breath sounds only heard at apex of right lung. Abdomen is distended and somewhat tense but nontender. LE edema 2+ involving legs and 1+ involving thighs.  Urine output 2900 ml last 24 hours   Labs/studies Results:  Recent Labs    Jan 03, 2017 0741 12/25/16 0352 12/26/16 0350  WBC 3.8* 4.1 6.6  HGB 10.9* 11.2* 11.2*  HCT 31.9* 32.5* 33.4*  PLT 48* 30* 30*    BMET  Recent Labs    01/03/2017 0741 12/25/16 0352 12/26/16 0350  NA 136 138 137  K 3.6 3.7 3.4*  CL 104 102 99*  CO2 25 29 29   GLUCOSE 108* 143* 176*  BUN 19 16 20   CREATININE 1.09 0.89 0.92  CALCIUM 7.4* 7.8* 7.9*    LFT  Recent Labs    01-03-17 0741 12/25/16 0352 12/26/16 0350  PROT 5.9* 6.0* 5.9*  ALBUMIN 2.0* 2.3* 2.6*  AST 58* 52* 46*  ALT 21 20 19   ALKPHOS 169* 147* 132*  BILITOT 1.8* 2.2* 2.1*     Assessment:  #1.  Fluid overload.  He is having good urine output.  He has lost another 2 pounds.  He is losing fluid primarily from his lower extremities but his ascites and/or pleural effusion has not decreased yet.  Thoracentesis was requested not performed because of thrombocytopenia. Will proceed with consultation for TIPS.  Relative contraindication is hepatic encephalopathy.  If he is not able to undergo TIPS will consider pleurodeses.  #2.  Hepatic encephalopathy.  Serum ammonia is coming down.   Clinically he is not encephalopathic.  #3.  Alcoholic cirrhosis.  As noted before he is not a candidate for liver transplant on account of cardiac disease.  #4.  Anemia due to chronic disease.  Number cytopenia secondary to chronic liver disease.   Recommendations:  IR consultation for TIPS. Will give another albumin infusion in a.m.

## 2016-12-26 NOTE — Progress Notes (Addendum)
PROGRESS NOTE    [name]  WCB:[id number]  DOB: 04/15/1960  DOA: 12/22/2016 PCP: [name], MD   Brief Admission Hx: [name]  is a 57 y.o. male, w Hypertension, Hyperlipidemia, CAD, Stroke, Cirrhosis, Recurrent R pleural effusion, apparently c/o increase in dyspnea.  Pt presented to ED with palpitations . He also complained of slight chest pain substernal , "sharp"  Without radiation.  Slight nausea  Slight emesis, pt noted slight blood.  Denies fever, chills, abd pain, diarrhea, brbpr, black stool, dysuria, hematuria.  Pt noted to have large right pleural effusion.    MDM/Assessment & Plan:   1. Atrial Fibrillation with RVR - Pt was started on IV diltiazem infusion with good results.  He is now off the IV diltiazem.  Heart rate was much better controlled but now he is becoming tachycardic again with HR fluctuating in the 110-140 range. Added oral diltiazem 11/11 and HR seems to be better controlled today.  Anticoagulation was held secondary to concerns for hematemesis and GI bleeding.  He does have a history of bleeding upper GI ulcers. 2. Hematemesis - GI planning EGD prior to discharge.  3. Atypical chest pain-likely secondary to A. fib with RVR, following troponins.  Continue carvedilol. Resolved now.  4. Large right pleural effusion-this is recurrent and was treated with an ultrasound-guided thoracentesis but it remains large, ordered another thoracentesis today for ongoing symptoms of SOB. ** I spoke with IR radiologist and because platelets are so low less than 50 it may be more dangerous to consider doing thoracentesis right now, will watch platelets and they will see him again on Wednesday for evaluation also for eval of TIPS on Wednesday.  5. Ascites - s/p US paracentesis 11/9 with good results, feeling much better clinically.  6. Hepatic encephalopathy- resumed lactulose, added rifaximin, follow ammonia levels, noted to be elevated this morning.  Increase lactulose  to TID.  Ammonia level down today.  7. Chronic depression-resume home Prozac and BuSpar. 8. Thrombocytopenia - from chronic liver disease - stable, no bleeding  DVT prophylaxis: SCDs Code Status: DNR Family Communication: none present during rounds Disposition Plan: TBD  Consultants:  GI  Cardiology  Subjective: Patient having SOB, requesting another thoracentesis, overall edema is improving  Objective: Vitals:   12/23/16 0504 12/23/16 0515 12/23/16 0530 12/23/16 0545  BP: (!) 95/54 90/60 93/64  92/60  Pulse: 79 78 80 81  Resp: 15 16 16 16   Temp:      TempSrc:      SpO2: 91% 91% 92% 91%  Weight:      Height:        Intake/Output Summary (Last 24 hours) at 12/23/2016 6160 Last data filed at 12/23/2016 0400 Gross per 24 hour  Intake 519.59 ml  Output -  Net 519.59 ml   Filed Weights   12/22/16 2345 12/23/16 0450  Weight: 111.7 kg (246 lb 4.1 oz) 111.7 kg (246 lb 4.1 oz)     REVIEW OF SYSTEMS  As per history otherwise all reviewed and reported negative  Exam:  General exam: Chronically ill-appearing male, awake alert in no distress cooperative and pleasant. Respiratory system: greatly diminished BS on right.  No increased work of breathing. Cardiovascular system: S1 & S2 heard, irregular. No  murmurs, gallops, clicks. Gastrointestinal system: Abdomen is distended, soft and nontender. Normal bowel sounds heard. Central nervous system: Alert and oriented. No focal neurological deficits. Extremities: 1+ edema bilateral lower extremities, 1+ edema right upper extremity  Data Reviewed: Basic Metabolic  Panel: Recent Labs  Lab 12/22/16 1957  NA 136  K 3.6  CL 103  CO2 22  GLUCOSE 136*  BUN 12  CREATININE 0.91  CALCIUM 7.4*   Liver Function Tests: Recent Labs  Lab 12/22/16 1957  AST 63*  ALT 23  ALKPHOS 195*  BILITOT 1.8*  PROT 6.8  ALBUMIN 2.4*   Recent Labs  Lab 12/22/16 1957  LIPASE 40   No results for input(s): AMMONIA in the last 168  hours. CBC: Recent Labs  Lab 12/22/16 1957  WBC 5.6  NEUTROABS 3.1  HGB 12.6*  HCT 37.4*  MCV 104.8*  PLT 100*   Cardiac Enzymes: Recent Labs  Lab 12/22/16 1957  TROPONINI <0.03   CBG (last 3)  No results for input(s): GLUCAP in the last 72 hours. No results found for this or any previous visit (from the past 240 hour(s)).   Studies: Ct Chest Wo Contrast  Result Date: 12/23/2016 CLINICAL DATA:  Pleural effusion EXAM: CT CHEST WITHOUT CONTRAST TECHNIQUE: Multidetector CT imaging of the chest was performed following the standard protocol without IV contrast. COMPARISON:  Chest radiograph 12/22/2016 Chest CT 07/18/2016 FINDINGS: Cardiovascular: Heart size is normal. No pericardial effusion. There are coronary artery calcifications. Normal course and caliber of the aorta. Mild aortic calcific atherosclerosis. Mediastinum/Nodes: The mediastinum is shifted to the left. There is no mediastinal adenopathy. The thyroid gland is normal. Lungs/Pleura: There is a massive right pleural effusion occupying nearly all of the right hemithorax. There is minimal maintained right upper lobe aeration. There is complete collapse of the right middle and lower lobes. The left lung is clear. Upper Abdomen: The liver is shrunken and nodular with large volume perihepatic ascites. There is moderate volume splenic ascites. Visualized portions of the kidneys are normal. Normal adrenal glands. The visible pancreas is unremarkable. Musculoskeletal: No chest wall mass or suspicious bone lesions identified. IMPRESSION: 1. Massive right pleural effusion occupying approximately 90% of the right hemithoracic volume and causing leftward shift of mediastinal structures. There is mild persistent aeration of the right upper lobe, but the right middle and lower lobes are completely collapsed. 2. Shrunken, nodular liver with large volume upper abdominal ascites, consistent with hepatic cirrhosis. 3. Coronary artery and aortic  atherosclerosis (ICD10-I70.0). Electronically Signed   By: Ulyses Jarred M.D.   On: 12/23/2016 00:10   Dg Chest Port 1 View  Result Date: 12/22/2016 CLINICAL DATA:  Cough for 4 days. EXAM: PORTABLE CHEST 1 VIEW COMPARISON:  Chest radiograph 12/06/2016. FINDINGS: Monitoring leads overlie the patient. Stable cardiomegaly. Interval increase in size of large right pleural effusion. Left lung is clear. Minimal aerated pulmonary tissue right lung apex. No pneumothorax. IMPRESSION: Interval increase in size of large right pleural effusion with minimal aerated pulmonary parenchyma right lung apex. Cardiomegaly. Electronically Signed   By: Lovey Newcomer M.D.   On: 12/22/2016 20:21   Scheduled Meds: . busPIRone  7.5 mg Oral BID  . carvedilol  6.25 mg Oral BID  . FLUoxetine  20 mg Oral Daily  . furosemide  60 mg Oral Daily  . lactulose  10 g Oral BID  . rifaximin  550 mg Oral BID  . sodium chloride flush  3 mL Intravenous Q12H  . spironolactone  50 mg Oral Daily  . sucralfate  1 g Oral TID WC & HS  . thiamine  50 mg Oral Daily   Continuous Infusions: . sodium chloride 50 mL/hr at 12/23/16 0400  . sodium chloride    . diltiazem (  CARDIZEM) infusion 5 mg/hr (12/23/16 0602)  . pantoprozole (PROTONIX) infusion 8 mg/hr (12/23/16 0400)    Principal Problem:   Atrial fibrillation (HCC) Active Problems:   Atrial fibrillation with RVR (HCC)   Pleural effusion, right   Thrombocytopenia (HCC)   Anemia   Abnormal liver function   Critical Care Time spent: 34 mins  [name], MD, FAAFP [facility] Pager [phone]  If 7PM-7AM, please contact night-coverage [email or web address] Password [email or web address] 12/23/2016, 6:38 AM    LOS: 1 day

## 2016-12-27 ENCOUNTER — Inpatient Hospital Stay (HOSPITAL_COMMUNITY): Payer: Medicare Other

## 2016-12-27 LAB — COMPREHENSIVE METABOLIC PANEL
ALT: 19 U/L (ref 17–63)
AST: 37 U/L (ref 15–41)
Albumin: 2.9 g/dL — ABNORMAL LOW (ref 3.5–5.0)
Alkaline Phosphatase: 117 U/L (ref 38–126)
Anion gap: 6 (ref 5–15)
BILIRUBIN TOTAL: 2.2 mg/dL — AB (ref 0.3–1.2)
BUN: 20 mg/dL (ref 6–20)
CALCIUM: 8.3 mg/dL — AB (ref 8.9–10.3)
CO2: 34 mmol/L — ABNORMAL HIGH (ref 22–32)
CREATININE: 0.74 mg/dL (ref 0.61–1.24)
Chloride: 98 mmol/L — ABNORMAL LOW (ref 101–111)
Glucose, Bld: 120 mg/dL — ABNORMAL HIGH (ref 65–99)
Potassium: 3.6 mmol/L (ref 3.5–5.1)
Sodium: 138 mmol/L (ref 135–145)
TOTAL PROTEIN: 6.1 g/dL — AB (ref 6.5–8.1)

## 2016-12-27 LAB — CBC
HCT: 34.3 % — ABNORMAL LOW (ref 39.0–52.0)
Hemoglobin: 11.6 g/dL — ABNORMAL LOW (ref 13.0–17.0)
MCH: 35.9 pg — ABNORMAL HIGH (ref 26.0–34.0)
MCHC: 33.8 g/dL (ref 30.0–36.0)
MCV: 106.2 fL — ABNORMAL HIGH (ref 78.0–100.0)
PLATELETS: 28 10*3/uL — AB (ref 150–400)
RBC: 3.23 MIL/uL — AB (ref 4.22–5.81)
RDW: 15.4 % (ref 11.5–15.5)
WBC: 8.1 10*3/uL (ref 4.0–10.5)

## 2016-12-27 LAB — AMMONIA: AMMONIA: 69 umol/L — AB (ref 9–35)

## 2016-12-27 NOTE — Progress Notes (Signed)
  Subjective:  Patient continues to complain of shortness of breath.  He states it is no better or worse.  He feels he has even less lower extremity edema and ascites.  He remains with good appetite.  His bowels are moving.    Objective: Blood pressure 122/81, pulse 94, temperature 97.7 F (36.5 C), temperature source Oral, resp. rate 17, height 6\' 3"  (1.905 m), weight 220 lb 1.6 oz (99.8 kg), SpO2 95 %. Patient is alert and remains with tremors in both hands. He does not have asterixis. Breath sounds are normal over left lung.  Breath sounds absent over lower two thirds of right lung. Abdomen is full but not distended.  It is soft and nontender.  Small umbilical hernia is unchanged. LE edema is now 1+ involving legs and thighs.  Labs/studies Results:  Recent Labs    07-Jan-2017 0352 12/26/16 0350 12/27/16 0355  WBC 4.1 6.6 8.1  HGB 11.2* 11.2* 11.6*  HCT 32.5* 33.4* 34.3*  PLT 30* 30* 28*    BMET  Recent Labs    2017-01-07 0352 12/26/16 0350 12/27/16 0355  NA 138 137 138  K 3.7 3.4* 3.6  CL 102 99* 98*  CO2 29 29 34*  GLUCOSE 143* 176* 120*  BUN 16 20 20   CREATININE 0.89 0.92 0.74  CALCIUM 7.8* 7.9* 8.3*    LFT  Recent Labs    2017/01/07 0352 12/26/16 0350 12/27/16 0355  PROT 6.0* 5.9* 6.1*  ALBUMIN 2.3* 2.6* 2.9*  AST 52* 46* 37  ALT 20 19 19   ALKPHOS 147* 132* 117  BILITOT 2.2* 2.1* 2.2*     Urine output 3745 ml for last 24 hours  Assessment:  #1.  Fluid overload.  He has right pleural effusion ascites and lower extremity edema.  He is having brisk diureses with albumin infusion and IV diuretic.  LE edema has decreased significantly and so has ascites but pleural effusion may not have as he continues to complain of dyspnea.  Thoracentesis was not performed yesterday because of thrombocytopenia and platelet count today is 28,000.  Renal function remains well-preserved.  He will need further therapy for difficult to manage pleural effusion.  IR consult has been  requested.  If he is not a candidate for TIPS will consider pleurodesis.  #2.  Hepatic encephalopathy.  Clinically he is not encephalopathic.  #3.  Decompensated alcoholic cirrhosis with multiple sequelae.  Unfortunately he is not a candidate for liver transplant because of cardiac disease.  #4.  Atrial fibrillation.  He has been transitioned from IV diltiazem to p.o.  #5.  History of upper GI bleed.  No evidence of GI bleed during this hospitalization.  He had single episode of hematemesis one day prior to admission.  His H&H has remained stable.  We will plan EGD when he is able to breathe better.   Recommendations:  Portable upright chest film today. Await IR recommendations regarding TIPS. Patient receiving another dose of albumin infusion today. Metabolic 7 in a.m.

## 2016-12-27 NOTE — Progress Notes (Signed)
PROGRESS NOTE    [name]  WRU:[id number]  DOB: 04/04/1960  DOA: 12/22/2016 PCP: [name], MD   Brief Admission Hx: [name]  is a 56 y.o. male, w Hypertension, Hyperlipidemia, CAD, Stroke, Cirrhosis, Recurrent R pleural effusion, apparently c/o increase in dyspnea.  Pt presented to ED with palpitations . He also complained of slight chest pain substernal , "sharp"  Without radiation.  Slight nausea  Slight emesis, pt noted slight blood.  Denies fever, chills, abd pain, diarrhea, brbpr, black stool, dysuria, hematuria.  Pt noted to have large right pleural effusion.    MDM/Assessment & Plan:   1. Atrial Fibrillation with RVR - Pt was started on IV diltiazem infusion with good results.  He is now off the IV diltiazem.  Heart rate was much better controlled but now he is becoming tachycardic again with HR fluctuating in the 110-140 range. Added oral diltiazem 11/11 and HR seems to be better controlled.  Anticoagulation was held secondary to concerns for hematemesis and GI bleeding.  He does have a history of bleeding upper GI ulcers. 2. Hematemesis - GI planning EGD prior to discharge.  3. Atypical chest pain-likely secondary to A. fib with RVR, troponins negative.  Continue carvedilol. Resolved now.   4. Large right pleural effusion-this is recurrent and was treated with an ultrasound-guided thoracentesis. He may need a repeat thoracentesis done.  IR to evaluate on Wed.  His diuretics have been increased and he is tolerating it.   5. Ascites - s/p US paracentesis 11/9 with good results, feeling much better clinically. He is overall less volume overloaded with the recent increase in diuretics.  6. Hepatic encephalopathy- resumed lactulose, added rifaximin, follow ammonia levels, noted to be elevated this morning.  Increase lactulose to TID.  Ammonia level down today.  7. Chronic depression-continue home Prozac and BuSpar. 8. Thrombocytopenia - from chronic liver disease - no  bleeding, plts down to 28. Not on any heparin products.  DVT prophylaxis: SCDs Code Status: DNR Family Communication: none present during rounds Disposition Plan: TBD  Consultants:  GI  Cardiology  Subjective: Patient still having SOB but overall has been stable, HR better controlled overnight.   Objective: Vitals:   12/23/16 0504 12/23/16 0515 12/23/16 0530 12/23/16 0545  BP: (!) 95/54 90/60 93/64  92/60  Pulse: 79 78 80 81  Resp: 15 16 16 16   Temp:      TempSrc:      SpO2: 91% 91% 92% 91%  Weight:      Height:        Intake/Output Summary (Last 24 hours) at 12/23/2016 9147 Last data filed at 12/23/2016 0400 Gross per 24 hour  Intake 519.59 ml  Output -  Net 519.59 ml   Filed Weights   12/22/16 2345 12/23/16 0450  Weight: 111.7 kg (246 lb 4.1 oz) 111.7 kg (246 lb 4.1 oz)     REVIEW OF SYSTEMS  As per history otherwise all reviewed and reported negative  Exam:  General exam: Chronically ill-appearing male, awake alert in no distress cooperative and pleasant. Respiratory system: greatly diminished BS on right.  No increased work of breathing. Cardiovascular system: S1 & S2 heard, irregular. No  murmurs, gallops, clicks. Gastrointestinal system: Abdomen is distended, soft and nontender. Normal bowel sounds heard. Central nervous system: Alert and oriented. No focal neurological deficits. Extremities: 1+ edema bilateral lower extremities, 1+ edema right upper extremity  Data Reviewed: Basic Metabolic Panel: Recent Labs  Lab 12/22/16 1957  NA 136  K 3.6  CL 103  CO2 22  GLUCOSE 136*  BUN 12  CREATININE 0.91  CALCIUM 7.4*   Liver Function Tests: Recent Labs  Lab 12/22/16 1957  AST 63*  ALT 23  ALKPHOS 195*  BILITOT 1.8*  PROT 6.8  ALBUMIN 2.4*   Recent Labs  Lab 12/22/16 1957  LIPASE 40   No results for input(s): AMMONIA in the last 168 hours. CBC: Recent Labs  Lab 12/22/16 1957  WBC 5.6  NEUTROABS 3.1  HGB 12.6*  HCT 37.4*  MCV  104.8*  PLT 100*   Cardiac Enzymes: Recent Labs  Lab 12/22/16 1957  TROPONINI <0.03   CBG (last 3)  No results for input(s): GLUCAP in the last 72 hours. No results found for this or any previous visit (from the past 240 hour(s)).   Studies: Ct Chest Wo Contrast  Result Date: 12/23/2016 CLINICAL DATA:  Pleural effusion EXAM: CT CHEST WITHOUT CONTRAST TECHNIQUE: Multidetector CT imaging of the chest was performed following the standard protocol without IV contrast. COMPARISON:  Chest radiograph 12/22/2016 Chest CT 07/18/2016 FINDINGS: Cardiovascular: Heart size is normal. No pericardial effusion. There are coronary artery calcifications. Normal course and caliber of the aorta. Mild aortic calcific atherosclerosis. Mediastinum/Nodes: The mediastinum is shifted to the left. There is no mediastinal adenopathy. The thyroid gland is normal. Lungs/Pleura: There is a massive right pleural effusion occupying nearly all of the right hemithorax. There is minimal maintained right upper lobe aeration. There is complete collapse of the right middle and lower lobes. The left lung is clear. Upper Abdomen: The liver is shrunken and nodular with large volume perihepatic ascites. There is moderate volume splenic ascites. Visualized portions of the kidneys are normal. Normal adrenal glands. The visible pancreas is unremarkable. Musculoskeletal: No chest wall mass or suspicious bone lesions identified. IMPRESSION: 1. Massive right pleural effusion occupying approximately 90% of the right hemithoracic volume and causing leftward shift of mediastinal structures. There is mild persistent aeration of the right upper lobe, but the right middle and lower lobes are completely collapsed. 2. Shrunken, nodular liver with large volume upper abdominal ascites, consistent with hepatic cirrhosis. 3. Coronary artery and aortic atherosclerosis (ICD10-I70.0). Electronically Signed   By: Ulyses Jarred M.D.   On: 12/23/2016 00:10   Dg  Chest Port 1 View  Result Date: 12/22/2016 CLINICAL DATA:  Cough for 4 days. EXAM: PORTABLE CHEST 1 VIEW COMPARISON:  Chest radiograph 12/06/2016. FINDINGS: Monitoring leads overlie the patient. Stable cardiomegaly. Interval increase in size of large right pleural effusion. Left lung is clear. Minimal aerated pulmonary tissue right lung apex. No pneumothorax. IMPRESSION: Interval increase in size of large right pleural effusion with minimal aerated pulmonary parenchyma right lung apex. Cardiomegaly. Electronically Signed   By: Lovey Newcomer M.D.   On: 12/22/2016 20:21   Scheduled Meds: . busPIRone  7.5 mg Oral BID  . carvedilol  6.25 mg Oral BID  . FLUoxetine  20 mg Oral Daily  . furosemide  60 mg Oral Daily  . lactulose  10 g Oral BID  . rifaximin  550 mg Oral BID  . sodium chloride flush  3 mL Intravenous Q12H  . spironolactone  50 mg Oral Daily  . sucralfate  1 g Oral TID WC & HS  . thiamine  50 mg Oral Daily   Continuous Infusions: . sodium chloride 50 mL/hr at 12/23/16 0400  . sodium chloride    . diltiazem (CARDIZEM) infusion 5 mg/hr (12/23/16 0602)  . pantoprozole (PROTONIX) infusion  8 mg/hr (12/23/16 0400)    Principal Problem:   Atrial fibrillation (HCC) Active Problems:   Atrial fibrillation with RVR (HCC)   Pleural effusion, right   Thrombocytopenia (HCC)   Anemia   Abnormal liver function  Critical Care Time spent: 31 mins  [name], MD, FAAFP [facility] Pager [phone]  If 7PM-7AM, please contact night-coverage [email or web address] Password [email or web address] 12/23/2016, 6:38 AM    LOS: 1 day

## 2016-12-28 ENCOUNTER — Inpatient Hospital Stay (HOSPITAL_COMMUNITY): Payer: Medicare Other

## 2016-12-28 DIAGNOSIS — I482 Chronic atrial fibrillation: Principal | ICD-10-CM

## 2016-12-28 LAB — CBC
HEMATOCRIT: 34.3 % — AB (ref 39.0–52.0)
Hemoglobin: 11.7 g/dL — ABNORMAL LOW (ref 13.0–17.0)
MCH: 35.7 pg — ABNORMAL HIGH (ref 26.0–34.0)
MCHC: 34.1 g/dL (ref 30.0–36.0)
MCV: 104.6 fL — ABNORMAL HIGH (ref 78.0–100.0)
Platelets: 31 10*3/uL — ABNORMAL LOW (ref 150–400)
RBC: 3.28 MIL/uL — ABNORMAL LOW (ref 4.22–5.81)
RDW: 15.4 % (ref 11.5–15.5)
WBC: 8.4 10*3/uL (ref 4.0–10.5)

## 2016-12-28 LAB — COMPREHENSIVE METABOLIC PANEL
ALBUMIN: 2.9 g/dL — AB (ref 3.5–5.0)
ALT: 18 U/L (ref 17–63)
AST: 35 U/L (ref 15–41)
Alkaline Phosphatase: 127 U/L — ABNORMAL HIGH (ref 38–126)
Anion gap: 5 (ref 5–15)
BILIRUBIN TOTAL: 1.9 mg/dL — AB (ref 0.3–1.2)
BUN: 17 mg/dL (ref 6–20)
CO2: 34 mmol/L — ABNORMAL HIGH (ref 22–32)
Calcium: 8.5 mg/dL — ABNORMAL LOW (ref 8.9–10.3)
Chloride: 96 mmol/L — ABNORMAL LOW (ref 101–111)
Creatinine, Ser: 0.71 mg/dL (ref 0.61–1.24)
GFR calc Af Amer: >60 mL/min (ref >60)
GFR calc non Af Amer: >60 mL/min (ref >60)
GLUCOSE: 125 mg/dL — AB (ref 65–99)
POTASSIUM: 3.7 mmol/L (ref 3.5–5.1)
Sodium: 135 mmol/L (ref 135–145)
TOTAL PROTEIN: 5.6 g/dL — AB (ref 6.5–8.1)

## 2016-12-28 LAB — CULTURE, BODY FLUID W GRAM STAIN -BOTTLE: Culture: NO GROWTH

## 2016-12-28 LAB — CULTURE, BODY FLUID-BOTTLE: CULTURE: NO GROWTH

## 2016-12-28 LAB — ACID FAST SMEAR (AFB, MYCOBACTERIA)

## 2016-12-28 LAB — ACID FAST SMEAR (AFB): ACID FAST SMEAR - AFSCU2: NEGATIVE

## 2016-12-28 MED ORDER — LIDOCAINE HCL (PF) 1 % IJ SOLN
INTRAMUSCULAR | Status: AC
  Administered 2016-12-28: 13:00:00
  Filled 2016-12-28: qty 10

## 2016-12-28 NOTE — Progress Notes (Signed)
Patient ID: DAVI KROON, male   DOB: 03/25/1960, 56 y.o.   MRN: [id number]   HPI States he has less edema. Continues to have SOB. Abdomen is not as distended. 2+ edema to lower extremities. Platelet ct remains low. 02 Sat 96% in bed. States when ambulating, he is SOB./ Wt from 220 to 2011 today. Really does not feel any better. Blood pressure 113/79, pulse 79, temperature 98.1 F (36.7 C), temperature source Oral, resp. rate 18, height 6\' 3"  (1.905 m), weight 2011, SpO2 93 %.  Intake/Output Summary (Last 24 hours) at 12/28/2016 0905 Last data filed at 12/28/2016 0500 Gross per 24 hour  Intake 808 ml  Output 3825 ml  Net -3017 ml   Fluid overload. Decrease in edema but unchanged pleural effusion. Platelet remain low. Referral has been made to IR radiology.

## 2016-12-28 NOTE — Progress Notes (Signed)
PROGRESS NOTE    [name]  GXQ:[id number]  DOB: Nov 04, 1960  DOA: 12/22/2016 PCP: [name], MD   Brief Admission Hx: [name]  is a 56 y.o. male, w Hypertension, Hyperlipidemia, CAD, Stroke, Cirrhosis, Recurrent R pleural effusion, apparently c/o increase in dyspnea.  Pt presented to ED with palpitations . He also complained of slight chest pain substernal , "sharp"  Without radiation.  Slight nausea  Slight emesis, pt noted slight blood.  Denies fever, chills, abd pain, diarrhea, brbpr, black stool, dysuria, hematuria.  Pt noted to have large right pleural effusion.    MDM/Assessment & Plan:   1. Atrial Fibrillation with RVR - Pt was started on IV diltiazem infusion with good results.  He is now off the IV diltiazem.  Heart rate was much better controlled. Added oral diltiazem 11/11 and HR seems to be better controlled.  Anticoagulation was held secondary to concerns for hematemesis and GI bleeding.  He does have a history of bleeding upper GI ulcers. 2. Hematemesis -patient had one episode of hematemesis prior to admission.  He is not had any further episodes.  Hemoglobin is stable. 3. Atypical chest pain-likely secondary to A. fib with RVR, troponins negative.  Continue carvedilol. Resolved now.   4. Large right pleural effusion-this is recurrent and was treated with an ultrasound-guided thoracentesis on 11/9.  Follow-up chest x-ray indicated persistent large right pleural effusion.  He underwent repeat thoracentesis on 11/14.  He has been referred to interventional radiology to be considered for TIPS procedure.  May also need to be evaluated for Pleurx catheter.  Due to his family situation, he wishes to have this evaluation done as an outpatient.  We will plan on following up with interventional radiology in the next week.  His diuretics have been increased and he is tolerating it.   5. Ascites - s/p US paracentesis 11/9 with good results, feeling much better clinically.  He is overall less volume overloaded with the recent increase in diuretics.  6. Hepatic encephalopathy- continue on lactulose and rifaximin.  Clinically, he is alert and oriented.  No evidence of encephalopathy..  7. Chronic depression-continue home Prozac and BuSpar. 8. Thrombocytopenia - from chronic liver disease - no bleeding  DVT prophylaxis: SCDs Code Status: DNR Family Communication: none present during rounds Disposition Plan: Discharge home, possibly in a.m.  Consultants:  GI  Cardiology  Subjective: He reports that he is still short of breath but overall improved since admission.  Patient reports that he received use this morning that his mother has passed away.  Objective: Vitals:   12/23/16 0504 12/23/16 0515 12/23/16 0530 12/23/16 0545  BP: (!) 95/54 90/60 93/64  92/60  Pulse: 79 78 80 81  Resp: 15 16 16 16   Temp:      TempSrc:      SpO2: 91% 91% 92% 91%  Weight:      Height:        Intake/Output Summary (Last 24 hours) at 12/23/2016 1448 Last data filed at 12/23/2016 0400 Gross per 24 hour  Intake 519.59 ml  Output -  Net 519.59 ml   Filed Weights   12/22/16 2345 12/23/16 0450  Weight: 111.7 kg (246 lb 4.1 oz) 111.7 kg (246 lb 4.1 oz)     REVIEW OF SYSTEMS  As per history otherwise all reviewed and reported negative  Exam:  General exam: Chronically ill-appearing male, awake alert in no distress cooperative and pleasant. Respiratory system: greatly diminished BS on right.  No increased  work of breathing. Cardiovascular system: S1 & S2 heard, irregular. No  murmurs, gallops, clicks. Gastrointestinal system: Abdomen is distended, soft and nontender. Normal bowel sounds heard. Central nervous system: Alert and oriented. No focal neurological deficits. Extremities: Trace edema bilaterally  Data Reviewed: Basic Metabolic Panel: Recent Labs  Lab 12/22/16 1957  NA 136  K 3.6  CL 103  CO2 22  GLUCOSE 136*  BUN 12  CREATININE 0.91  CALCIUM 7.4*     Liver Function Tests: Recent Labs  Lab 12/22/16 1957  AST 63*  ALT 23  ALKPHOS 195*  BILITOT 1.8*  PROT 6.8  ALBUMIN 2.4*   Recent Labs  Lab 12/22/16 1957  LIPASE 40   No results for input(s): AMMONIA in the last 168 hours. CBC: Recent Labs  Lab 12/22/16 1957  WBC 5.6  NEUTROABS 3.1  HGB 12.6*  HCT 37.4*  MCV 104.8*  PLT 100*   Cardiac Enzymes: Recent Labs  Lab 12/22/16 1957  TROPONINI <0.03   CBG (last 3)  No results for input(s): GLUCAP in the last 72 hours. No results found for this or any previous visit (from the past 240 hour(s)).   Studies: Ct Chest Wo Contrast  Result Date: 12/23/2016 CLINICAL DATA:  Pleural effusion EXAM: CT CHEST WITHOUT CONTRAST TECHNIQUE: Multidetector CT imaging of the chest was performed following the standard protocol without IV contrast. COMPARISON:  Chest radiograph 12/22/2016 Chest CT 07/18/2016 FINDINGS: Cardiovascular: Heart size is normal. No pericardial effusion. There are coronary artery calcifications. Normal course and caliber of the aorta. Mild aortic calcific atherosclerosis. Mediastinum/Nodes: The mediastinum is shifted to the left. There is no mediastinal adenopathy. The thyroid gland is normal. Lungs/Pleura: There is a massive right pleural effusion occupying nearly all of the right hemithorax. There is minimal maintained right upper lobe aeration. There is complete collapse of the right middle and lower lobes. The left lung is clear. Upper Abdomen: The liver is shrunken and nodular with large volume perihepatic ascites. There is moderate volume splenic ascites. Visualized portions of the kidneys are normal. Normal adrenal glands. The visible pancreas is unremarkable. Musculoskeletal: No chest wall mass or suspicious bone lesions identified. IMPRESSION: 1. Massive right pleural effusion occupying approximately 90% of the right hemithoracic volume and causing leftward shift of mediastinal structures. There is mild persistent  aeration of the right upper lobe, but the right middle and lower lobes are completely collapsed. 2. Shrunken, nodular liver with large volume upper abdominal ascites, consistent with hepatic cirrhosis. 3. Coronary artery and aortic atherosclerosis (ICD10-I70.0). Electronically Signed   By: Ulyses Jarred M.D.   On: 12/23/2016 00:10   Dg Chest Port 1 View  Result Date: 12/22/2016 CLINICAL DATA:  Cough for 4 days. EXAM: PORTABLE CHEST 1 VIEW COMPARISON:  Chest radiograph 12/06/2016. FINDINGS: Monitoring leads overlie the patient. Stable cardiomegaly. Interval increase in size of large right pleural effusion. Left lung is clear. Minimal aerated pulmonary tissue right lung apex. No pneumothorax. IMPRESSION: Interval increase in size of large right pleural effusion with minimal aerated pulmonary parenchyma right lung apex. Cardiomegaly. Electronically Signed   By: Lovey Newcomer M.D.   On: 12/22/2016 20:21   Scheduled Meds: . busPIRone  7.5 mg Oral BID  . carvedilol  6.25 mg Oral BID  . FLUoxetine  20 mg Oral Daily  . furosemide  60 mg Oral Daily  . lactulose  10 g Oral BID  . rifaximin  550 mg Oral BID  . sodium chloride flush  3 mL Intravenous Q12H  .  spironolactone  50 mg Oral Daily  . sucralfate  1 g Oral TID WC & HS  . thiamine  50 mg Oral Daily   Continuous Infusions: . sodium chloride 50 mL/hr at 12/23/16 0400  . sodium chloride    . diltiazem (CARDIZEM) infusion 5 mg/hr (12/23/16 0602)  . pantoprozole (PROTONIX) infusion 8 mg/hr (12/23/16 0400)    Principal Problem:   Atrial fibrillation (HCC) Active Problems:   Atrial fibrillation with RVR (HCC)   Pleural effusion, right   Thrombocytopenia (HCC)   Anemia   Abnormal liver function  Time spent: 30 mins  Kathie Dike, MD [facility] Pager [phone]  If 7PM-7AM, please contact night-coverage [email or web address] Password [email or web address] 12/23/2016, 6:38 AM    LOS: 1 day

## 2016-12-28 NOTE — Consult Note (Signed)
Chief Complaint: Patient was seen in consultation today for TIPS procedure Chief Complaint  Patient presents with  . Respiratory Distress   at the request of Dr Dutch Quint  Referring Physician(s): Dr Pearletha Forge  Supervising Physician: Daryll Brod  Patient Status: [facility] IP  History of Present Illness: [name] is a 56 y.o. male   Asked to see pt regarding Transjugular Intrahepatic Portal System Shunt procedure. Hx refractory ascites/Cirrhosis ETOH Recurrent right pleural effusion; SOB now Known HTN; HLD; CAD; CVA Not candidate for liver transplant secondary cardiac disease Has had serial paracentesis and thoracentesis in Radiology 7 thoracentesis since 07/18/16 3 paracentesis same time period Always non malignant  Pt has no GI bleeding; no complaint of hematemesis now Abdominal distension and pain Noted hepatic encephalopathy--- on lactulose He is alert/oriented today; appropriate  I discussed with him procedure of TIPS Answered many questions While I was in room---pt received phone call that his mother passed away. He says he may be interested in coming to [facility] for consultation and   discussion with IR Rad regarding available procedures; risk;benefits. "He will need at least a week to get his mother's affairs together." He has my number at [facility] I will place order for consultation at clinic He is aware and agreeable.  MELD 11 today  Past Medical History:  Diagnosis Date  . Anxiety   . Cardiomyopathy ([place])   . Chronic back pain   . Cirrhosis ([place])   . DDD (degenerative disc disease), lumbosacral   . Depression   . History of alcohol abuse   . Hx of cardiac cath 1996  . Hyperlipidemia   . Hypertension   . Liver failure ([place])   . PAF (paroxysmal atrial fibrillation) ([place])   . Recurrent pleural effusion on right   . Stroke [facility]) 2003    Past Surgical History:  Procedure Laterality Date  . CARDIAC CATHETERIZATION  1996     Allergies: Albumin (human); Bee venom; Bee venom; and Other  Medications: Prior to Admission medications   Medication Sig Start Date End Date Taking? Authorizing Provider  busPIRone (BUSPAR) 7.5 MG tablet Take 1 tablet (7.5 mg total) by mouth 2 (two) times daily. FOR ANXIETY 07/06/16  Yes [place], Modena Nunnery, MD  CARAFATE 1 GM/10ML suspension TAKE TWO TEASPOONSFUL (10 ML) BY MOUTH FOUR TIMES DAILY - WITH MEALS AND AT BEDTIME 07/27/16  Yes [place], Modena Nunnery, MD  carvedilol (COREG) 6.25 MG tablet Take 1 tablet (6.25 mg total) by mouth 2 (two) times daily. 07/06/16  Yes [place], Modena Nunnery, MD  EPINEPHrine 0.3 mg/0.3 mL IJ SOAJ injection Inject 0.3 mLs (0.3 mg total) into the muscle once. 06/11/15  Yes [place], Modena Nunnery, MD  FLUoxetine (PROZAC) 20 MG tablet Take 1 tablet (20 mg total) by mouth daily. 07/06/16  Yes [place], Modena Nunnery, MD  furosemide (LASIX) 40 MG tablet Take 0.5 tablets (20 mg total) by mouth 2 (two) times daily. Patient taking differently: Take 60 mg daily by mouth.  11/12/16 12/22/16 Yes Rexene Alberts, MD  lactulose [facility]) 10 GM/15ML solution TAKE 22.5ML BY MOUTH TWICE DAILY 08/29/16  Yes [place], Modena Nunnery, MD  omeprazole (PRILOSEC) 20 MG capsule Take 1 capsule (20 mg total) by mouth 2 (two) times daily. 07/06/16  Yes , Modena Nunnery, MD  sildenafil (VIAGRA) 100 MG tablet Take 0.5 tablets (50 mg total) by mouth daily as needed for erectile dysfunction. Take 1/2 tablet ( 50 mg ) 30 minutes prior to sexual engagement. 09/07/16  Yes Arnoldo Lenis, MD  spironolactone (ALDACTONE) 50 MG tablet Take 0.5 tablets (25 mg total) by mouth daily. Patient taking differently: Take 50 mg daily by mouth.  11/12/16 12/22/16 Yes Rexene Alberts, MD  thiamine (VITAMIN B-1) 50 MG tablet Take by mouth daily.   Yes [provider]     Family History  Problem Relation Age of Onset  . Heart disease Mother   . Cancer Father        throat cancer  . Cancer Brother        melanoma  . Thyroid  disease Sister     Social History   Socioeconomic History  . Marital status: Divorced    Spouse name: None  . Number of children: 1  . Years of education: 65  . Highest education level: None  Social Needs  . Financial resource strain: None  . Food insecurity - worry: None  . Food insecurity - inability: None  . Transportation needs - medical: None  . Transportation needs - non-medical: None  Occupational History  . Occupation: disabled  Tobacco Use  . Smoking status: Never Smoker  . Smokeless tobacco: Former Systems developer    Types: Chew  Substance and Sexual Activity  . Alcohol use: No    Alcohol/week: 0.0 oz    Comment: quit - 5 months ago  . Drug use: No  . Sexual activity: Yes  Other Topics Concern  . None  Social History Narrative   ** Merged History Encounter **       Patient is right handed. Patient drinks about 2 cups of soda a day.    Review of Systems: A 12 point ROS discussed and pertinent positives are indicated in the HPI above.  All other systems are negative.  Review of Systems  Constitutional: Positive for activity change, appetite change and fatigue. Negative for fever.  Respiratory: Positive for shortness of breath. Negative for wheezing.   Cardiovascular: Positive for leg swelling. Negative for chest pain.  Gastrointestinal: Positive for abdominal distention and abdominal pain. Negative for blood in stool and diarrhea.  Musculoskeletal: Negative for back pain.  Neurological: Positive for weakness.  Psychiatric/Behavioral: Negative for behavioral problems and confusion.    Vital Signs: BP (!) 125/93   Pulse 80   Temp 98.1 F (36.7 C) (Oral)   Resp (!) 22   Ht 6\' 3"  (1.905 m)   Wt 246 lb 7.6 oz (111.8 kg)   SpO2 95%   BMI 30.81 kg/m   Physical Exam  Constitutional: He is oriented to person, place, and time.  Cardiovascular: Normal rate and regular rhythm.  No murmur heard. Pulmonary/Chest: Effort normal. He has wheezes.  Abdominal: Soft. He  exhibits distension. There is no tenderness.  Musculoskeletal: Normal range of motion.  Neurological: He is alert and oriented to person, place, and time.  Skin: Skin is warm and dry.  Psychiatric: He has a normal mood and affect. His behavior is normal. Judgment and thought content normal.  Nursing note and vitals reviewed.   Imaging: Dg Chest 1 View  Result Date: 12/06/2016 CLINICAL DATA:  Cirrhosis, ascites, RIGHT pleural effusion post thoracentesis EXAM: CHEST 1 VIEW COMPARISON:  11/10/2016 FINDINGS: Expiratory technique. Enlargement of cardiac silhouette with pulmonary vascular congestion. Large RIGHT pleural effusion despite preceding removal of 2.0 L of fluid by thoracentesis. Significant atelectasis of the lower RIGHT lung. Minimal LEFT basilar atelectasis. No pneumothorax. IMPRESSION: No pneumothorax following RIGHT thoracentesis. Persistent large RIGHT pleural effusion despite preceding removal of 2 L  of fluid. Electronically Signed   By: Lavonia Dana M.D.   On: 12/06/2016 10:19   Ct Chest Wo Contrast  Result Date: 12/23/2016 CLINICAL DATA:  Pleural effusion EXAM: CT CHEST WITHOUT CONTRAST TECHNIQUE: Multidetector CT imaging of the chest was performed following the standard protocol without IV contrast. COMPARISON:  Chest radiograph 12/22/2016 Chest CT 07/18/2016 FINDINGS: Cardiovascular: Heart size is normal. No pericardial effusion. There are coronary artery calcifications. Normal course and caliber of the aorta. Mild aortic calcific atherosclerosis. Mediastinum/Nodes: The mediastinum is shifted to the left. There is no mediastinal adenopathy. The thyroid gland is normal. Lungs/Pleura: There is a massive right pleural effusion occupying nearly all of the right hemithorax. There is minimal maintained right upper lobe aeration. There is complete collapse of the right middle and lower lobes. The left lung is clear. Upper Abdomen: The liver is shrunken and nodular with large volume perihepatic  ascites. There is moderate volume splenic ascites. Visualized portions of the kidneys are normal. Normal adrenal glands. The visible pancreas is unremarkable. Musculoskeletal: No chest wall mass or suspicious bone lesions identified. IMPRESSION: 1. Massive right pleural effusion occupying approximately 90% of the right hemithoracic volume and causing leftward shift of mediastinal structures. There is mild persistent aeration of the right upper lobe, but the right middle and lower lobes are completely collapsed. 2. Shrunken, nodular liver with large volume upper abdominal ascites, consistent with hepatic cirrhosis. 3. Coronary artery and aortic atherosclerosis (ICD10-I70.0). Electronically Signed   By: Ulyses Jarred M.D.   On: 12/23/2016 00:10   US Paracentesis  Result Date: 12/23/2016 INDICATION: Cirrhosis, ascites EXAM: ULTRASOUND GUIDED DIAGNOSTIC AND THERAPEUTIC PARACENTESIS MEDICATIONS: None. COMPLICATIONS: None immediate. PROCEDURE: Procedure, benefits, and risks of procedure were discussed with patient. Written informed consent for procedure was obtained. Time out protocol followed. Adequate collection of ascites localized by ultrasound in RIGHT lower quadrant. Skin prepped and draped in usual sterile fashion. Skin and soft tissues anesthetized with 10 mL of 1% lidocaine. 5 [place] Yueh catheter placed into peritoneal cavity. 3.5 L of yellow ascitic fluid aspirated by vacuum bottle suction. Procedure tolerated well by patient without immediate complication. FINDINGS: A total of approximately 3.5 L of ascitic fluid was removed. Samples were sent to the laboratory as requested by the clinical team. IMPRESSION: Successful ultrasound-guided paracentesis yielding 3.5 liters of peritoneal fluid. Electronically Signed   By: Lavonia Dana M.D.   On: 12/23/2016 16:16   Dg Chest Port 1 View  Result Date: 12/27/2016 CLINICAL DATA:  56 year old male with increasing shortness of breath and abdominal swelling.  Subsequent encounter. EXAM: PORTABLE CHEST 1 VIEW COMPARISON:  12/25/2016 chest x-ray.  12/22/2016 chest CT. FINDINGS: Very large right-sided pleural effusion has increased minimally in size compared to prior exam. Aeration right upper lobe. Central pulmonary vascular prominence. Mediastinal and cardiac silhouette incompletely assessed secondary to the large pleural effusion. There may be minimal shift to the left secondary to the large right sided pleural effusion. Right acromioclavicular joint degenerative changes. IMPRESSION: Very large right-sided pleural effusion has increased minimally in size compared to prior exam. Electronically Signed   By: Genia Del M.D.   On: 12/27/2016 10:34   Dg Chest Port 1 View  Result Date: 12/25/2016 CLINICAL DATA:  Shortness of breath. Right thoracentesis on 12/23/2016 EXAM: PORTABLE CHEST 1 VIEW COMPARISON:  12/23/2016 FINDINGS: Cardiomediastinal silhouette is normal. Mediastinal contours appear intact. There is a decreased in size, still large right pleural effusion. Secondary atelectatic changes in the right lung. The aerated left lung demonstrates  mild increase of the interstitial markings. Osseous structures are without acute abnormality. Soft tissues are grossly normal. IMPRESSION: Mild decrease in size of large right pleural effusion. No evidence of pneumothorax. Electronically Signed   By: Fidela Salisbury M.D.   On: 12/25/2016 07:34   Dg Chest Port 1 View  Result Date: 12/23/2016 CLINICAL DATA:  Post RIGHT thoracentesis, recurrent RIGHT pleural effusion, cirrhosis EXAM: PORTABLE CHEST 1 VIEW COMPARISON:  Portable exam 1204 hours compared to 12/22/2016 FINDINGS: Enlargement of cardiac silhouette with pulmonary vascular congestion. Large RIGHT pleural effusion despite preceding removal of 2.1 L of RIGHT pleural effusion. Significant atelectasis of RIGHT lung. Slight crowding of perihilar markings on LEFT likely related expiratory technique. LEFT lung grossly  clear. No pneumothorax. IMPRESSION: No pneumothorax following RIGHT thoracentesis. Persistent large RIGHT pleural effusion and basilar atelectasis despite removal of 2.1 L of RIGHT pleural fluid. Electronically Signed   By: Lavonia Dana M.D.   On: 12/23/2016 12:45   Dg Chest Port 1 View  Result Date: 12/22/2016 CLINICAL DATA:  Cough for 4 days. EXAM: PORTABLE CHEST 1 VIEW COMPARISON:  Chest radiograph 12/06/2016. FINDINGS: Monitoring leads overlie the patient. Stable cardiomegaly. Interval increase in size of large right pleural effusion. Left lung is clear. Minimal aerated pulmonary tissue right lung apex. No pneumothorax. IMPRESSION: Interval increase in size of large right pleural effusion with minimal aerated pulmonary parenchyma right lung apex. Cardiomegaly. Electronically Signed   By: Lovey Newcomer M.D.   On: 12/22/2016 20:21   US Thoracentesis Asp Pleural Space W/img Guide  Result Date: 12/23/2016 INDICATION: Recurrent RIGHT pleural effusion EXAM: ULTRASOUND GUIDED DIAGNOSTIC AND THERAPEUTIC RIGHT THORACENTESIS MEDICATIONS: None. COMPLICATIONS: None immediate. PROCEDURE: Procedure, benefits, and risks of procedure were discussed with patient. Written informed consent for procedure was obtained. Time out protocol followed. Procedure performed in ICU. Pleural effusion localized by ultrasound at the posterior RIGHT hemithorax. Skin prepped and draped in usual sterile fashion. Skin and soft tissues anesthetized with 10 mL of 1% lidocaine. 8 French thoracentesis catheter placed into the RIGHT pleural space. 2.1 L of yellow RIGHT pleural fluid aspirated by syringe pump. Procedure tolerated well by patient without immediate complication. FINDINGS: A total of approximately 2.1 L of RIGHT pleural fluid was removed. Samples were sent to the laboratory as requested by the clinical team. IMPRESSION: Successful ultrasound guided RIGHT thoracentesis yielding 2.1 L of of pleural fluid. Electronically Signed   By: Lavonia Dana M.D.   On: 12/23/2016 12:43   US Thoracentesis Asp Pleural Space W/img Guide  Result Date: 12/06/2016 INDICATION: Cirrhosis, ascites, RIGHT pleural effusion EXAM: ULTRASOUND GUIDED DIAGNOSTIC AND THERAPEUTIC RIGHT THORACENTESIS MEDICATIONS: None. COMPLICATIONS: None immediate. PROCEDURE: Procedure, benefits, and risks of procedure were discussed with patient. Written informed consent for procedure was obtained. Time out protocol followed. Pleural effusion localized by ultrasound at the posterior RIGHT hemithorax. Skin prepped and draped in usual sterile fashion. Skin and soft tissues anesthetized with 10 mL of 1% lidocaine. 8 French thoracentesis catheter placed into the RIGHT pleural space. 2.0 L of clear yellow RIGHT pleural fluid aspirated by syringe pump. Procedure tolerated well by patient without immediate complication. FINDINGS: A total of approximately 2 L of RIGHT pleural fluid was removed. Samples were sent to the laboratory as requested by the clinical team. IMPRESSION: Successful ultrasound guided RIGHT thoracentesis yielding 2 L of pleural fluid. Electronically Signed   By: Lavonia Dana M.D.   On: 12/06/2016 10:18    Labs:  CBC: Recent Labs    12/25/16 0352  12/26/16 0350 12/27/16 0355 12/28/16 0347  WBC 4.1 6.6 8.1 8.4  HGB 11.2* 11.2* 11.6* 11.7*  HCT 32.5* 33.4* 34.3* 34.3*  PLT 30* 30* 28* 31*    COAGS: Recent Labs    07/13/16 1009 08/03/16 2131 11/12/16 0653 12/22/16 1957  INR 1.2* 1.46 1.44 1.20  APTT  --  32  --   --     BMP: Recent Labs    12/25/16 0352 12/26/16 0350 12/27/16 0355 12/28/16 0347  NA 138 137 138 135  K 3.7 3.4* 3.6 3.7  CL 102 99* 98* 96*  CO2 29 29 34* 34*  GLUCOSE 143* 176* 120* 125*  BUN 16 20 20 17   CALCIUM 7.8* 7.9* 8.3* 8.5*  CREATININE 0.89 0.92 0.74 0.71  GFRNONAA >60 >60 >60 >60  GFRAA >60 >60 >60 >60    LIVER FUNCTION TESTS: Recent Labs    12/25/16 0352 12/26/16 0350 12/27/16 0355 12/28/16 0347  BILITOT  2.2* 2.1* 2.2* 1.9*  AST 52* 46* 37 35  ALT 20 19 19 18   ALKPHOS 147* 132* 117 127*  PROT 6.0* 5.9* 6.1* 5.6*  ALBUMIN 2.3* 2.6* 2.9* 2.9*    TUMOR MARKERS: Recent Labs    07/13/16 1009  AFPTM 4.3    Assessment and Plan:  ETOH Cirrhosis Recurrent ascites Recurrent Rt pleural effusion Request made for TIPS discussion with pt. Will need dedicated consult with IR Radiologist --to determine best procedure to offer this pt. He is agreeable to proceed (pts mother just passed today--- he feels he will need at least a week before he can comfortably attend consult. Orders in place for OP IR Clinic to contact him.)  Thank you for this interesting consult.  I greatly enjoyed meeting WILBERTH DAMON and look forward to participating in their care.  A copy of this report was sent to the requesting provider on this date.  Electronically Signed: Lavonia Drafts, PA-C 12/28/2016, 10:36 AM   I spent a total of 40 Minutes    in face to face in clinical consultation, greater than 50% of which was counseling/coordinating care for possible TIPS

## 2016-12-28 NOTE — Procedures (Signed)
   Rt thoracentesis US guided  2.4 L yellow fluid  Tolerated well CXR Pending

## 2016-12-29 LAB — CBC
HEMATOCRIT: 38.6 % — AB (ref 39.0–52.0)
HEMOGLOBIN: 13.2 g/dL (ref 13.0–17.0)
MCH: 36.7 pg — ABNORMAL HIGH (ref 26.0–34.0)
MCHC: 34.2 g/dL (ref 30.0–36.0)
MCV: 107.2 fL — ABNORMAL HIGH (ref 78.0–100.0)
Platelets: 32 10*3/uL — ABNORMAL LOW (ref 150–400)
RBC: 3.6 MIL/uL — AB (ref 4.22–5.81)
RDW: 15.9 % — ABNORMAL HIGH (ref 11.5–15.5)
WBC: 6.1 10*3/uL (ref 4.0–10.5)

## 2016-12-29 LAB — COMPREHENSIVE METABOLIC PANEL
ALBUMIN: 2.8 g/dL — AB (ref 3.5–5.0)
ALK PHOS: 144 U/L — AB (ref 38–126)
ALT: 23 U/L (ref 17–63)
ANION GAP: 6 (ref 5–15)
AST: 45 U/L — ABNORMAL HIGH (ref 15–41)
BILIRUBIN TOTAL: 2 mg/dL — AB (ref 0.3–1.2)
BUN: 21 mg/dL — ABNORMAL HIGH (ref 6–20)
CALCIUM: 8.7 mg/dL — AB (ref 8.9–10.3)
CO2: 35 mmol/L — ABNORMAL HIGH (ref 22–32)
Chloride: 95 mmol/L — ABNORMAL LOW (ref 101–111)
Creatinine, Ser: 0.93 mg/dL (ref 0.61–1.24)
GFR calc non Af Amer: >60 mL/min (ref >60)
Glucose, Bld: 107 mg/dL — ABNORMAL HIGH (ref 65–99)
POTASSIUM: 4.1 mmol/L (ref 3.5–5.1)
Sodium: 136 mmol/L (ref 135–145)
TOTAL PROTEIN: 6.1 g/dL — AB (ref 6.5–8.1)

## 2016-12-29 MED ORDER — DILTIAZEM HCL 30 MG PO TABS: 30.0000 mg | ORAL_TABLET | Freq: Three times a day (TID) | ORAL | 1 refills | Status: AC

## 2016-12-29 MED ORDER — SPIRONOLACTONE 100 MG PO TABS: 100.0000 mg | ORAL_TABLET | Freq: Two times a day (BID) | ORAL | 0 refills | Status: AC

## 2016-12-29 MED ORDER — FUROSEMIDE 40 MG PO TABS
60.0000 mg | ORAL_TABLET | Freq: Two times a day (BID) | ORAL | 1 refills | Status: AC
Start: 2016-12-29 — End: 2017-01-28

## 2016-12-29 MED ORDER — RIFAXIMIN 550 MG PO TABS: 550.0000 mg | ORAL_TABLET | Freq: Two times a day (BID) | ORAL | 0 refills | Status: AC

## 2016-12-29 NOTE — Care Management Important Message (Signed)
Important Message  Patient Details  Name: [name] MRN: [id number] Date of Birth: February 03, 1961   Medicare Important Message Given:  Yes    Kyliah Deanda, Chauncey Reading, RN 12/29/2016, 12:39 PM

## 2016-12-29 NOTE — Care Management Note (Signed)
Case Management Note  Patient Details  Name: [name] MRN: [id number] Date of Birth: 03-Aug-1960   If discussed at Long Length of Stay Meetings, dates discussed:  12/29/2016  Additional Comments:  Jenniferlynn Saad, Chauncey Reading, RN 12/29/2016, 1:56 PM

## 2016-12-29 NOTE — Progress Notes (Signed)
1415 d/c instructions and paperwork given to patient. Patient aware to pick up Rxs at [facility]. Patient aware to f/u Jannifer Franklin in IR. IV catheter removed from RIGHT FA, intact w/no s/s of infection/infiltration noted. Patient's friend at bedside to transport patient home. Patient requested to ambulate out of facility w/o assistance.

## 2016-12-29 NOTE — Discharge Summary (Signed)
Physician Discharge Summary  JANZEN SACKS SEG:[id number] DOB: May 30, 1960 DOA: 12/22/2016  PCP: [name], MD  Admit date: 12/22/2016 Discharge date: 12/29/2016  Admitted From: home Disposition:  home  Recommendations for Outpatient Follow-up:  1. Follow up with PCP in 1-2 weeks 2. Please obtain BMP/CBC in one week 3. Patient will follow up with GI next week to evaluate for possible repeat thoracentesis 4. Patient will follow up with interventional radiology next week to be evaluated for possible TIPS  Discharge Condition: stable CODE STATUS: full code Diet recommendation: Heart Healthy   Brief/Interim Summary: LonnieLargenis a56 y.o.male,w Hypertension, Hyperlipidemia, CAD, Stroke, Cirrhosis, Recurrent R pleural effusion, apparently c/o increase in dyspnea. Pt presented to ED with palpitations . He also complained of slight chest pain substernal , "sharp" Without radiation. Slight nausea Slight emesis, pt noted slight blood. Denies fever, chills, abd pain, diarrhea, brbpr, black stool, dysuria, hematuria.  Pt noted to have large right pleural effusion.    Discharge Diagnoses:  Principal Problem:   Atrial fibrillation ([place]) Active Problems:   Atrial fibrillation with RVR ([facility])   Pleural effusion, right   Thrombocytopenia ([facility])   Anemia   Abnormal liver function  1. Atrial Fibrillation with RVR - Pt was started on IV diltiazem infusion with good results.  He was able to be weaned off and maintain good heart rate control on oral medications. Added oral diltiazem 11/11 and HR seems to be better controlled.  Anticoagulation was held secondary to concerns for hematemesis and GI bleeding.  He does have a history of bleeding upper GI ulcers.  Patient will be high risk for anticoagulation with concurrent thrombocytopenia 2. Hematemesis -patient had one episode of hematemesis prior to admission.  He is not had any further episodes.  Hemoglobin is stable. 3. Atypical chest  pain-likely secondary to A. fib with RVR, troponins negative.  Continue carvedilol. Resolved now.   4. 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  This is recurrent and was treated with an ultrasound-guided thoracentesis on 11/9.  Follow-up chest x-ray indicated persistent large right pleural effusion.  He underwent repeat thoracentesis on 11/14.  He has been referred to interventional radiology to be considered for TIPS procedure.  May also need to be evaluated for Pleurx catheter.    On 11/14, patient received news that his mother had passed away.  He wanted to hold off on any further workup and wished to discharge home in order to tend to his mother's affairs.  Due to his family situation, he wishes to have this evaluation done as an outpatient.  He will plan on following up with interventional radiology in the next week.    He has had excellent urine output with intravenous Lasix.  He does report compliance at home with diuretics.  We will increase his Lasix from 60 mg daily to 60 mg twice daily.  Continue on spironolactone.  He will follow-up with GI next week to see if repeat thoracentesis is necessary..   5. Ascites - s/p US paracentesis 11/9 with good results, feeling much better clinically. He is overall less volume overloaded with the recent increase in diuretics.  6. Hepatic encephalopathy- continue on lactulose and rifaximin.  Clinically, he is alert and oriented.  No evidence of encephalopathy..  7. Chronic depression-continue home Prozac and BuSpar. 8. 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 It gave him hives      Medication List    TAKE these medications   busPIRone 7.5 MG tablet Commonly known as:  BUSPAR Take 1 tablet (7.5 mg total) by mouth 2 (two) times daily. FOR ANXIETY   CARAFATE 1 GM/10ML suspension Generic drug:  sucralfate TAKE TWO TEASPOONSFUL (10 ML) BY MOUTH FOUR TIMES DAILY - WITH MEALS AND AT BEDTIME   carvedilol 6.25 MG tablet Commonly known as:  COREG Take 1 tablet (6.25 mg total) by mouth 2 (two) times daily.   diltiazem 30 MG tablet Commonly known as:  CARDIZEM Take 1 tablet (30 mg total) every 8 (eight) hours by mouth.   EPINEPHrine 0.3 mg/0.3 mL Soaj injection Commonly known as:  EPI-PEN Inject 0.3 mLs (0.3 mg total) into the muscle once.   FLUoxetine 20 MG tablet Commonly known as:  PROZAC Take 1 tablet (20 mg total) by mouth daily.   furosemide 40 MG tablet Commonly known as:  LASIX Take 1.5 tablets (60 mg total) 2 (two) times daily by mouth. What changed:  how much to take   lactulose 10 GM/15ML solution Commonly known as:  CHRONULAC TAKE 22.5ML BY MOUTH TWICE DAILY   omeprazole 20 MG capsule Commonly known as:  PRILOSEC Take 1 capsule (20 mg total) by mouth 2 (two) times daily.   rifaximin 550 MG Tabs tablet Commonly known as:  XIFAXAN Take 1 tablet (550 mg total) 2 (two) times daily by mouth.   sildenafil 100 MG tablet Commonly known as:  VIAGRA Take 0.5 tablets (50 mg total) by mouth daily as needed for erectile dysfunction. Take 1/2 tablet ( 50 mg ) 30 minutes prior to sexual engagement.   spironolactone 100 MG tablet Commonly known as:  ALDACTONE Take 1 tablet (100 mg total) 2 (two) times daily by mouth. What changed:    medication strength  how much to take  when to take this   thiamine 50 MG tablet Commonly known as:  VITAMIN B-1 Take by mouth daily.      Follow-up  Information    Greggory Keen, MD Follow up in 2 week(s).   Specialties:  Interventional Radiology, Radiology Why:  pt will hear from [facility] with time and date of consultation. call [phone] if any needs Contact information: [address]  [place] [address] [phone]          Allergies  Allergen Reactions  . Albumin (Human) Hives and Itching  . Bee Venom Anaphylaxis and Nausea Only  . Bee Venom Anaphylaxis, Nausea And Vomiting and Swelling    Throat swelling  . Other Hives    Pt states there was a fluid through IV he was getting while getting fluid taken off his stomach. It gave him hives    Consultations:  Gastroenterology  cardiology   Procedures/Studies: Dg Chest 1 View  Result Date: 12/06/2016 CLINICAL DATA:  Cirrhosis, ascites, RIGHT pleural effusion post thoracentesis EXAM: CHEST 1 VIEW COMPARISON:  11/10/2016 FINDINGS: Expiratory technique. Enlargement of cardiac  silhouette with pulmonary vascular congestion. Large RIGHT pleural effusion despite preceding removal of 2.0 L of fluid by thoracentesis. Significant atelectasis of the lower RIGHT lung. Minimal LEFT basilar atelectasis. No pneumothorax. IMPRESSION: No pneumothorax following RIGHT thoracentesis. Persistent large RIGHT pleural effusion despite preceding removal of 2 L of fluid. Electronically Signed   By: Lavonia Dana M.D.   On: 12/06/2016 10:19   Ct Chest Wo Contrast  Result Date: 12/23/2016 CLINICAL DATA:  Pleural effusion EXAM: CT CHEST WITHOUT CONTRAST TECHNIQUE: Multidetector CT imaging of the chest was performed following the standard protocol without IV contrast. COMPARISON:  Chest radiograph 12/22/2016 Chest CT 07/18/2016 FINDINGS: Cardiovascular: Heart size is normal. No pericardial effusion. There are coronary artery calcifications. Normal course and caliber of the aorta. Mild aortic calcific atherosclerosis. Mediastinum/Nodes: The mediastinum is shifted to the left. There is  no mediastinal adenopathy. The thyroid gland is normal. Lungs/Pleura: There is a massive right pleural effusion occupying nearly all of the right hemithorax. There is minimal maintained right upper lobe aeration. There is complete collapse of the right middle and lower lobes. The left lung is clear. Upper Abdomen: The liver is shrunken and nodular with large volume perihepatic ascites. There is moderate volume splenic ascites. Visualized portions of the kidneys are normal. Normal adrenal glands. The visible pancreas is unremarkable. Musculoskeletal: No chest wall mass or suspicious bone lesions identified. IMPRESSION: 1. Massive right pleural effusion occupying approximately 90% of the right hemithoracic volume and causing leftward shift of mediastinal structures. There is mild persistent aeration of the right upper lobe, but the right middle and lower lobes are completely collapsed. 2. Shrunken, nodular liver with large volume upper abdominal ascites, consistent with hepatic cirrhosis. 3. Coronary artery and aortic atherosclerosis (ICD10-I70.0). Electronically Signed   By: Ulyses Jarred M.D.   On: 12/23/2016 00:10   US Paracentesis  Result Date: 12/23/2016 INDICATION: Cirrhosis, ascites EXAM: ULTRASOUND GUIDED DIAGNOSTIC AND THERAPEUTIC PARACENTESIS MEDICATIONS: None. COMPLICATIONS: None immediate. PROCEDURE: Procedure, benefits, and risks of procedure were discussed with patient. Written informed consent for procedure was obtained. Time out protocol followed. Adequate collection of ascites localized by ultrasound in RIGHT lower quadrant. Skin prepped and draped in usual sterile fashion. Skin and soft tissues anesthetized with 10 mL of 1% lidocaine. 5 [place] Yueh catheter placed into peritoneal cavity. 3.5 L of yellow ascitic fluid aspirated by vacuum bottle suction. Procedure tolerated well by patient without immediate complication. FINDINGS: A total of approximately 3.5 L of ascitic fluid was removed. Samples  were sent to the laboratory as requested by the clinical team. IMPRESSION: Successful ultrasound-guided paracentesis yielding 3.5 liters of peritoneal fluid. Electronically Signed   By: Lavonia Dana M.D.   On: 12/23/2016 16:16   Dg Chest Port 1 View  Result Date: 12/28/2016 CLINICAL DATA:  Status post right-sided thoracentesis. Recurrent right pleural effusion. History of cirrhosis, cardiomyopathy. EXAM: PORTABLE CHEST 1 VIEW COMPARISON:  Chest x-ray of December 27, 2016 FINDINGS: There remains a large right pleural effusion. The pleural fluid volume present does not appear significantly reduced. There is no pneumothorax. The left lung is well-expanded and clear. The cardiac silhouette is enlarged. The pulmonary vascularity is engorged. IMPRESSION: Persistent large right-sided pleural effusion. No postprocedure pneumothorax following thoracentesis. CHF. Electronically Signed   By: David  Martinique M.D.   On: 12/28/2016 13:28   Dg Chest Port 1 View  Result Date: 12/27/2016 CLINICAL DATA:  56 year old male with increasing shortness of breath and abdominal swelling. Subsequent encounter. EXAM: PORTABLE CHEST 1 VIEW COMPARISON:  12/25/2016 chest x-ray.  12/22/2016 chest CT. FINDINGS: Very large right-sided pleural effusion has increased minimally in size compared to prior exam. Aeration right upper lobe. Central pulmonary vascular prominence. Mediastinal and cardiac silhouette incompletely assessed secondary to the large pleural effusion. There may be minimal shift to the left secondary to the large right sided pleural effusion. Right acromioclavicular joint degenerative changes. IMPRESSION: Very large right-sided pleural effusion has increased minimally in size compared to prior exam. Electronically Signed   By: Genia Del M.D.   On: 12/27/2016 10:34   Dg Chest Port 1 View  Result Date: 12/25/2016 CLINICAL DATA:  Shortness of breath. Right thoracentesis on 12/23/2016 EXAM: PORTABLE CHEST 1 VIEW  COMPARISON:  12/23/2016 FINDINGS: Cardiomediastinal silhouette is normal. Mediastinal contours appear intact. There is a decreased in size, still large right pleural effusion. Secondary atelectatic changes in the right lung. The aerated left lung demonstrates mild increase of the interstitial markings. Osseous structures are without acute abnormality. Soft tissues are grossly normal. IMPRESSION: Mild decrease in size of large right pleural effusion. No evidence of pneumothorax. Electronically Signed   By: Fidela Salisbury M.D.   On: 12/25/2016 07:34   Dg Chest Port 1 View  Result Date: 12/23/2016 CLINICAL DATA:  Post RIGHT thoracentesis, recurrent RIGHT pleural effusion, cirrhosis EXAM: PORTABLE CHEST 1 VIEW COMPARISON:  Portable exam 1204 hours compared to 12/22/2016 FINDINGS: Enlargement of cardiac silhouette with pulmonary vascular congestion. Large RIGHT pleural effusion despite preceding removal of 2.1 L of RIGHT pleural effusion. Significant atelectasis of RIGHT lung. Slight crowding of perihilar markings on LEFT likely related expiratory technique. LEFT lung grossly clear. No pneumothorax. IMPRESSION: No pneumothorax following RIGHT thoracentesis. Persistent large RIGHT pleural effusion and basilar atelectasis despite removal of 2.1 L of RIGHT pleural fluid. Electronically Signed   By: Lavonia Dana M.D.   On: 12/23/2016 12:45   Dg Chest Port 1 View  Result Date: 12/22/2016 CLINICAL DATA:  Cough for 4 days. EXAM: PORTABLE CHEST 1 VIEW COMPARISON:  Chest radiograph 12/06/2016. FINDINGS: Monitoring leads overlie the patient. Stable cardiomegaly. Interval increase in size of large right pleural effusion. Left lung is clear. Minimal aerated pulmonary tissue right lung apex. No pneumothorax. IMPRESSION: Interval increase in size of large right pleural effusion with minimal aerated pulmonary parenchyma right lung apex. Cardiomegaly. Electronically Signed   By: Lovey Newcomer M.D.   On: 12/22/2016 20:21   US  Thoracentesis Asp Pleural Space W/img Guide  Result Date: 12/28/2016 INDICATION: Symptomatic right sided pleural effusion EXAM: US THORACENTESIS ASP PLEURAL SPACE W/IMG GUIDE COMPARISON:  Previous thoracentesis. MEDICATIONS: 10 cc 1% lidocaine. COMPLICATIONS: None immediate. TECHNIQUE: Informed written consent was obtained from the patient after a discussion of the risks, benefits and alternatives to treatment. A timeout was performed prior to the initiation of the procedure. Initial ultrasound scanning demonstrates a right pleural effusion. The lower chest was prepped and draped in the usual sterile fashion. 1% lidocaine was used for local anesthesia. Under direct ultrasound guidance, a 19 gauge, 7-cm, Yueh catheter was introduced. An ultrasound image was saved for documentation purposes. The thoracentesis was performed. The catheter was removed and a dressing was applied. The patient tolerated the procedure well without immediate post procedural complication. The patient was escorted to have an upright chest radiograph. FINDINGS: A total of approximately 2.4 liters of yellow fluid was removed. IMPRESSION: Successful ultrasound-guided Rt sided thoracentesis yielding 2.4 liters of pleural fluid. Read by Lavonia Drafts [facility] Electronically Signed   By: Lorriane Shire M.D.  On: 12/28/2016 13:10   US Thoracentesis Asp Pleural Space W/img Guide  Result Date: 12/23/2016 INDICATION: Recurrent RIGHT pleural effusion EXAM: ULTRASOUND GUIDED DIAGNOSTIC AND THERAPEUTIC RIGHT THORACENTESIS MEDICATIONS: None. COMPLICATIONS: None immediate. PROCEDURE: Procedure, benefits, and risks of procedure were discussed with patient. Written informed consent for procedure was obtained. Time out protocol followed. Procedure performed in ICU. Pleural effusion localized by ultrasound at the posterior RIGHT hemithorax. Skin prepped and draped in usual sterile fashion. Skin and soft tissues anesthetized with 10 mL of 1% lidocaine. 8  French thoracentesis catheter placed into the RIGHT pleural space. 2.1 L of yellow RIGHT pleural fluid aspirated by syringe pump. Procedure tolerated well by patient without immediate complication. FINDINGS: A total of approximately 2.1 L of RIGHT pleural fluid was removed. Samples were sent to the laboratory as requested by the clinical team. IMPRESSION: Successful ultrasound guided RIGHT thoracentesis yielding 2.1 L of of pleural fluid. Electronically Signed   By: Lavonia Dana M.D.   On: 12/23/2016 12:43   US Thoracentesis Asp Pleural Space W/img Guide  Result Date: 12/06/2016 INDICATION: Cirrhosis, ascites, RIGHT pleural effusion EXAM: ULTRASOUND GUIDED DIAGNOSTIC AND THERAPEUTIC RIGHT THORACENTESIS MEDICATIONS: None. COMPLICATIONS: None immediate. PROCEDURE: Procedure, benefits, and risks of procedure were discussed with patient. Written informed consent for procedure was obtained. Time out protocol followed. Pleural effusion localized by ultrasound at the posterior RIGHT hemithorax. Skin prepped and draped in usual sterile fashion. Skin and soft tissues anesthetized with 10 mL of 1% lidocaine. 8 French thoracentesis catheter placed into the RIGHT pleural space. 2.0 L of clear yellow RIGHT pleural fluid aspirated by syringe pump. Procedure tolerated well by patient without immediate complication. FINDINGS: A total of approximately 2 L of RIGHT pleural fluid was removed. Samples were sent to the laboratory as requested by the clinical team. IMPRESSION: Successful ultrasound guided RIGHT thoracentesis yielding 2 L of pleural fluid. Electronically Signed   By: Lavonia Dana M.D.   On: 12/06/2016 10:18    Echo: - Normal LV wall thickness with LVEF approximately 45%. There is   diffuse hypokinesis, more prominent in the basal inferior wall.   Indeterminate diastolic function. Severe left atrial enlargement.   Mildly thickened mitral leaflets with systolic bowing and mild   mitral regurgitation. Mild aortic  regurgitation. Mildly dilated   right ventricle. Mild tricuspid regurgitation with PASP 29 mmHg.   Small left-to-right shunt. In region of secundum ASD. Large right   pleural effusion noted. No pericardial effusion.  11/9 Right Thoracentesis with removal of 2.1L of fluid  11/9 Paracentesis with removal of 3.5L of fluid  11/14 Right Thoracentesis with removal of 2.4L of fluid  Subjective: Feeling better today after thoracentesis performed yesterday. Shortness of breath is better. Wants to go home.  Discharge Exam: Vitals:   12/29/16 0820 12/29/16 1105  BP:  117/85  Pulse: 78   Resp: (!) 28   Temp:  97.8 F (36.6 C)  SpO2: 94%    Vitals:   12/29/16 0750 12/29/16 0800 12/29/16 0820 12/29/16 1105  BP:    117/85  Pulse: 76  78   Resp: 19 18 (!) 28   Temp: 98.2 F (36.8 C)   97.8 F (36.6 C)  TempSrc: Oral   Oral  SpO2: 96%  94%   Weight:      Height:        General: Pt is alert, awake, not in acute distress Cardiovascular: RRR, S1/S2 +, no rubs, no gallops Respiratory: diminished breath sounds on right side Abdominal: Soft,  NT,mild distention, bowel sounds + Extremities: trace edema, no cyanosis    The results of significant diagnostics from this hospitalization (including imaging, microbiology, ancillary and laboratory) are listed below for reference.     Microbiology: Recent Results (from the past 240 hour(s))  MRSA PCR Screening     Status: None   Collection Time: 12/22/16 11:05 PM  Result Value Ref Range Status   MRSA by PCR NEGATIVE NEGATIVE Final    Comment:        The GeneXpert MRSA Assay (FDA approved for NASAL specimens only), is one component of a comprehensive MRSA colonization surveillance program. It is not intended to diagnose MRSA infection nor to guide or monitor treatment for MRSA infections.   Gram stain     Status: None   Collection Time: 12/23/16 11:00 AM  Result Value Ref Range Status   Specimen Description PLEURAL  Final   Special  Requests NONE  Final   Gram Stain   Final    NO ORGANISMS SEEN WBC PRESENT, PREDOMINANTLY MONONUCLEAR CYTOSPIN SMEAR    Report Status 12/23/2016 FINAL  Final  Acid Fast Smear (AFB)     Status: None   Collection Time: 12/23/16 12:15 PM  Result Value Ref Range Status   AFB Specimen Processing Concentration  Final   Acid Fast Smear Negative  Final    Comment: (NOTE) Performed At: [facility] [place], [place] [id number] Rush Farmer MD [id number]    Source (AFB) PLEURAL  Final  Culture, body fluid-bottle     Status: None   Collection Time: 12/23/16 12:15 PM  Result Value Ref Range Status   Specimen Description PLEURAL COLLECTED BY DOCTOR  Final   Special Requests BOTTLES DRAWN AEROBIC AND ANAEROBIC 10CC  Final   Culture NO GROWTH 5 DAYS  Final   Report Status 12/28/2016 FINAL  Final  Culture, body fluid-bottle     Status: None   Collection Time: 12/23/16  3:30 PM  Result Value Ref Range Status   Specimen Description ASCITIC COLLECTED BY DOCTOR  Final   Special Requests BOTTLES DRAWN AEROBIC AND ANAEROBIC 10 CC EACH  Final   Culture NO GROWTH 5 DAYS  Final   Report Status 12/28/2016 FINAL  Final  Gram stain     Status: None   Collection Time: 12/23/16  3:30 PM  Result Value Ref Range Status   Specimen Description PERITONEAL  Final   Special Requests NONE  Final   Gram Stain   Final    CYTOSPIN SMEAR NO ORGANISMS SEEN WBC SEEN WBC PRESENT, PREDOMINANTLY MONONUCLEAR    Report Status 12/23/2016 FINAL  Final     Labs: BNP (last 3 results) Recent Labs    08/03/16 2131 11/09/16 1048 12/22/16 1957  BNP 216.0* 725.0* 403.4*   Basic Metabolic Panel: Recent Labs  Lab 12/25/16 0352 12/26/16 0350 12/27/16 0355 12/28/16 0347 12/29/16 0431  NA 138 137 138 135 136  K 3.7 3.4* 3.6 3.7 4.1  CL 102 99* 98* 96* 95*  CO2 29 29 34* 34* 35*  GLUCOSE 143* 176* 120* 125* 107*  BUN 16 20 20 17  21*  CREATININE 0.89 0.92 0.74 0.71 0.93  CALCIUM 7.8*  7.9* 8.3* 8.5* 8.7*  MG  --  1.4*  --   --   --    Liver Function Tests: Recent Labs  Lab 12/25/16 0352 12/26/16 0350 12/27/16 0355 12/28/16 0347 12/29/16 0431  AST 52* 46* 37 35 45*  ALT 20 19 19 18  23  ALKPHOS 147* 132* 117 127* 144*  BILITOT 2.2* 2.1* 2.2* 1.9* 2.0*  PROT 6.0* 5.9* 6.1* 5.6* 6.1*  ALBUMIN 2.3* 2.6* 2.9* 2.9* 2.8*   Recent Labs  Lab 12/22/16 1957  LIPASE 40   Recent Labs  Lab 12/24/16 0413 12/25/16 0352 12/26/16 0351 12/27/16 0355  AMMONIA 76* 71* 39* 69*   CBC: Recent Labs  Lab 12/22/16 1957  12/25/16 0352 12/26/16 0350 12/27/16 0355 12/28/16 0347 12/29/16 0431  WBC 5.6   < > 4.1 6.6 8.1 8.4 6.1  NEUTROABS 3.1  --   --   --   --   --   --   HGB 12.6*   < > 11.2* 11.2* 11.6* 11.7* 13.2  HCT 37.4*   < > 32.5* 33.4* 34.3* 34.3* 38.6*  MCV 104.8*   < > 105.9* 106.7* 106.2* 104.6* 107.2*  PLT 100*   < > 30* 30* 28* 31* 32*   < > = values in this interval not displayed.   Cardiac Enzymes: Recent Labs  Lab 12/22/16 1957 12/23/16 0427 12/23/16 0948  TROPONINI <0.03 <0.03 <0.03   BNP: Invalid input(s): POCBNP CBG: No results for input(s): GLUCAP in the last 168 hours. D-Dimer No results for input(s): DDIMER in the last 72 hours. Hgb A1c No results for input(s): HGBA1C in the last 72 hours. Lipid Profile No results for input(s): CHOL, HDL, LDLCALC, TRIG, CHOLHDL, LDLDIRECT in the last 72 hours. Thyroid function studies No results for input(s): TSH, T4TOTAL, T3FREE, THYROIDAB in the last 72 hours.  Invalid input(s): FREET3 Anemia work up No results for input(s): VITAMINB12, FOLATE, FERRITIN, TIBC, IRON, RETICCTPCT in the last 72 hours. Urinalysis    Component Value Date/Time   COLORURINE AMBER (A) 12/14/2015 1439   APPEARANCEUR CLEAR 12/14/2015 1439   LABSPEC 1.015 12/14/2015 1439   PHURINE 6.0 12/14/2015 1439   GLUCOSEU TRACE (A) 12/14/2015 1439   HGBUR 3+ (A) 12/14/2015 1439   BILIRUBINUR NEGATIVE 12/14/2015 1439   KETONESUR  TRACE (A) 12/14/2015 1439   PROTEINUR 1+ (A) 12/14/2015 1439   UROBILINOGEN 1.0 07/26/2014 2231   NITRITE NEGATIVE 12/14/2015 1439   LEUKOCYTESUR NEGATIVE 12/14/2015 1439   Sepsis Labs Invalid input(s): PROCALCITONIN,  WBC,  LACTICIDVEN Microbiology Recent Results (from the past 240 hour(s))  MRSA PCR Screening     Status: None   Collection Time: 12/22/16 11:05 PM  Result Value Ref Range Status   MRSA by PCR NEGATIVE NEGATIVE Final    Comment:        The GeneXpert MRSA Assay (FDA approved for NASAL specimens only), is one component of a comprehensive MRSA colonization surveillance program. It is not intended to diagnose MRSA infection nor to guide or monitor treatment for MRSA infections.   Gram stain     Status: None   Collection Time: 12/23/16 11:00 AM  Result Value Ref Range Status   Specimen Description PLEURAL  Final   Special Requests NONE  Final   Gram Stain   Final    NO ORGANISMS SEEN WBC PRESENT, PREDOMINANTLY MONONUCLEAR CYTOSPIN SMEAR    Report Status 12/23/2016 FINAL  Final  Acid Fast Smear (AFB)     Status: None   Collection Time: 12/23/16 12:15 PM  Result Value Ref Range Status   AFB Specimen Processing Concentration  Final   Acid Fast Smear Negative  Final    Comment: (NOTE) Performed At: [facility] [address] [place], [place] [id number] Rush Farmer MD [id number]    Source (AFB) PLEURAL  Final  Culture, body fluid-bottle     Status: None   Collection Time: 12/23/16 12:15 PM  Result Value Ref Range Status   Specimen Description PLEURAL COLLECTED BY DOCTOR  Final   Special Requests BOTTLES DRAWN AEROBIC AND ANAEROBIC 10CC  Final   Culture NO GROWTH 5 DAYS  Final   Report Status 12/28/2016 FINAL  Final  Culture, body fluid-bottle     Status: None   Collection Time: 12/23/16  3:30 PM  Result Value Ref Range Status   Specimen Description ASCITIC COLLECTED BY DOCTOR  Final   Special Requests BOTTLES DRAWN AEROBIC AND ANAEROBIC 10  CC EACH  Final   Culture NO GROWTH 5 DAYS  Final   Report Status 12/28/2016 FINAL  Final  Gram stain     Status: None   Collection Time: 12/23/16  3:30 PM  Result Value Ref Range Status   Specimen Description PERITONEAL  Final   Special Requests NONE  Final   Gram Stain   Final    CYTOSPIN SMEAR NO ORGANISMS SEEN WBC SEEN WBC PRESENT, PREDOMINANTLY MONONUCLEAR    Report Status 12/23/2016 FINAL  Final     Time coordinating discharge: Over 30 minutes  SIGNED:   Kathie Dike, MD  [facility] 12/29/2016, 12:31 PM Pager   If 7PM-7AM, please contact night-coverage [email or web address] Password [email or web address]

## 2017-01-04 ENCOUNTER — Other Ambulatory Visit (INDEPENDENT_AMBULATORY_CARE_PROVIDER_SITE_OTHER): Payer: Self-pay | Admitting: Internal Medicine

## 2017-01-04 ENCOUNTER — Telehealth (INDEPENDENT_AMBULATORY_CARE_PROVIDER_SITE_OTHER): Payer: Self-pay | Admitting: *Deleted

## 2017-01-04 DIAGNOSIS — J90 Pleural effusion, not elsewhere classified: Secondary | ICD-10-CM

## 2017-01-04 NOTE — Telephone Encounter (Signed)
noted 

## 2017-01-04 NOTE — Telephone Encounter (Signed)
Patient left message -- stated you said for him to call today so you can do a standing order to have fluid drawn off lungs

## 2017-01-06 ENCOUNTER — Ambulatory Visit (HOSPITAL_COMMUNITY)
Admission: RE | Admit: 2017-01-06 | Discharge: 2017-01-06 | Disposition: A | Discharge status: Home / Self Care | Payer: Medicare Other | Source: Ambulatory Visit | Attending: Internal Medicine | Admitting: Internal Medicine

## 2017-01-06 ENCOUNTER — Other Ambulatory Visit (INDEPENDENT_AMBULATORY_CARE_PROVIDER_SITE_OTHER): Payer: Self-pay | Admitting: Internal Medicine

## 2017-01-06 DIAGNOSIS — Z9889 Other specified postprocedural states: Secondary | ICD-10-CM

## 2017-01-06 DIAGNOSIS — J918 Pleural effusion in other conditions classified elsewhere: Secondary | ICD-10-CM

## 2017-01-06 DIAGNOSIS — J90 Pleural effusion, not elsewhere classified: Secondary | ICD-10-CM | POA: Insufficient documentation

## 2017-01-06 DIAGNOSIS — K769 Liver disease, unspecified: Secondary | ICD-10-CM

## 2017-01-06 LAB — BODY FLUID CELL COUNT WITH DIFFERENTIAL
Eos, Fluid: 0 %
LYMPHS FL: 15 %
MONOCYTE-MACROPHAGE-SEROUS FLUID: 81 % (ref 50–90)
Neutrophil Count, Fluid: 4 % (ref 0–25)
WBC FLUID: 74 uL (ref 0–1000)

## 2017-01-06 LAB — GRAM STAIN

## 2017-01-09 LAB — PATHOLOGIST SMEAR REVIEW

## 2017-01-11 ENCOUNTER — Other Ambulatory Visit (INDEPENDENT_AMBULATORY_CARE_PROVIDER_SITE_OTHER): Payer: Self-pay | Admitting: Internal Medicine

## 2017-01-11 ENCOUNTER — Telehealth (INDEPENDENT_AMBULATORY_CARE_PROVIDER_SITE_OTHER): Payer: Self-pay | Admitting: *Deleted

## 2017-01-11 DIAGNOSIS — K7460 Unspecified cirrhosis of liver: Secondary | ICD-10-CM

## 2017-01-11 LAB — CULTURE, BODY FLUID-BOTTLE

## 2017-01-11 LAB — CULTURE, BODY FLUID W GRAM STAIN -BOTTLE: Culture: NO GROWTH

## 2017-01-11 NOTE — Telephone Encounter (Signed)
Patient wants to have fluid drawn off lungs Friday, if ok, need order

## 2017-01-12 ENCOUNTER — Encounter (HOSPITAL_COMMUNITY): Payer: Self-pay | Admitting: Emergency Medicine

## 2017-01-12 ENCOUNTER — Other Ambulatory Visit: Payer: Self-pay

## 2017-01-12 ENCOUNTER — Ambulatory Visit (HOSPITAL_COMMUNITY)
Admission: RE | Admit: 2017-01-12 | Discharge: 2017-01-12 | Disposition: A | Discharge status: Home / Self Care | Payer: Medicare Other | Source: Ambulatory Visit | Attending: Internal Medicine | Admitting: Internal Medicine

## 2017-01-12 ENCOUNTER — Inpatient Hospital Stay (HOSPITAL_COMMUNITY)
Admission: EM | Admit: 2017-01-12 | Discharge: 2017-01-13 | DRG: 308 | Disposition: A | Discharge status: Home / Self Care | Payer: Medicare Other | Attending: Internal Medicine | Admitting: Internal Medicine

## 2017-01-12 ENCOUNTER — Emergency Department (HOSPITAL_COMMUNITY): Payer: Medicare Other

## 2017-01-12 DIAGNOSIS — I4891 Unspecified atrial fibrillation: Secondary | ICD-10-CM | POA: Diagnosis present

## 2017-01-12 DIAGNOSIS — K7460 Unspecified cirrhosis of liver: Secondary | ICD-10-CM | POA: Diagnosis not present

## 2017-01-12 DIAGNOSIS — Z888 Allergy status to other drugs, medicaments and biological substances status: Secondary | ICD-10-CM | POA: Diagnosis not present

## 2017-01-12 DIAGNOSIS — Z8349 Family history of other endocrine, nutritional and metabolic diseases: Secondary | ICD-10-CM | POA: Diagnosis not present

## 2017-01-12 DIAGNOSIS — Z87891 Personal history of nicotine dependence: Secondary | ICD-10-CM | POA: Diagnosis not present

## 2017-01-12 DIAGNOSIS — I5043 Acute on chronic combined systolic (congestive) and diastolic (congestive) heart failure: Secondary | ICD-10-CM | POA: Diagnosis present

## 2017-01-12 DIAGNOSIS — Z808 Family history of malignant neoplasm of other organs or systems: Secondary | ICD-10-CM | POA: Diagnosis not present

## 2017-01-12 DIAGNOSIS — Z8249 Family history of ischemic heart disease and other diseases of the circulatory system: Secondary | ICD-10-CM | POA: Diagnosis not present

## 2017-01-12 DIAGNOSIS — F329 Major depressive disorder, single episode, unspecified: Secondary | ICD-10-CM | POA: Diagnosis present

## 2017-01-12 DIAGNOSIS — J9601 Acute respiratory failure with hypoxia: Secondary | ICD-10-CM

## 2017-01-12 DIAGNOSIS — M5137 Other intervertebral disc degeneration, lumbosacral region: Secondary | ICD-10-CM | POA: Diagnosis present

## 2017-01-12 DIAGNOSIS — I480 Paroxysmal atrial fibrillation: Principal | ICD-10-CM | POA: Diagnosis present

## 2017-01-12 DIAGNOSIS — R188 Other ascites: Secondary | ICD-10-CM | POA: Diagnosis not present

## 2017-01-12 DIAGNOSIS — Z91030 Bee allergy status: Secondary | ICD-10-CM

## 2017-01-12 DIAGNOSIS — M549 Dorsalgia, unspecified: Secondary | ICD-10-CM | POA: Diagnosis present

## 2017-01-12 DIAGNOSIS — I110 Hypertensive heart disease with heart failure: Secondary | ICD-10-CM | POA: Diagnosis present

## 2017-01-12 DIAGNOSIS — E785 Hyperlipidemia, unspecified: Secondary | ICD-10-CM | POA: Diagnosis present

## 2017-01-12 DIAGNOSIS — F419 Anxiety disorder, unspecified: Secondary | ICD-10-CM | POA: Diagnosis present

## 2017-01-12 DIAGNOSIS — J90 Pleural effusion, not elsewhere classified: Secondary | ICD-10-CM | POA: Diagnosis not present

## 2017-01-12 DIAGNOSIS — I429 Cardiomyopathy, unspecified: Secondary | ICD-10-CM | POA: Diagnosis present

## 2017-01-12 DIAGNOSIS — D696 Thrombocytopenia, unspecified: Secondary | ICD-10-CM | POA: Diagnosis not present

## 2017-01-12 DIAGNOSIS — K7290 Hepatic failure, unspecified without coma: Secondary | ICD-10-CM | POA: Diagnosis present

## 2017-01-12 DIAGNOSIS — I10 Essential (primary) hypertension: Secondary | ICD-10-CM | POA: Diagnosis not present

## 2017-01-12 DIAGNOSIS — Z8673 Personal history of transient ischemic attack (TIA), and cerebral infarction without residual deficits: Secondary | ICD-10-CM

## 2017-01-12 DIAGNOSIS — Z9889 Other specified postprocedural states: Secondary | ICD-10-CM

## 2017-01-12 DIAGNOSIS — G8929 Other chronic pain: Secondary | ICD-10-CM | POA: Diagnosis present

## 2017-01-12 DIAGNOSIS — I5042 Chronic combined systolic (congestive) and diastolic (congestive) heart failure: Secondary | ICD-10-CM | POA: Diagnosis present

## 2017-01-12 LAB — CBC
HCT: 40.3 % (ref 39.0–52.0)
HEMOGLOBIN: 13.3 g/dL (ref 13.0–17.0)
MCH: 35.2 pg — ABNORMAL HIGH (ref 26.0–34.0)
MCHC: 33 g/dL (ref 30.0–36.0)
MCV: 106.6 fL — AB (ref 78.0–100.0)
PLATELETS: 76 10*3/uL — AB (ref 150–400)
RBC: 3.78 MIL/uL — AB (ref 4.22–5.81)
RDW: 16.1 % — ABNORMAL HIGH (ref 11.5–15.5)
WBC: 5.5 10*3/uL (ref 4.0–10.5)

## 2017-01-12 LAB — COMPREHENSIVE METABOLIC PANEL
ALK PHOS: 168 U/L — AB (ref 38–126)
ALT: 28 U/L (ref 17–63)
AST: 59 U/L — AB (ref 15–41)
Albumin: 3.1 g/dL — ABNORMAL LOW (ref 3.5–5.0)
Anion gap: 12 (ref 5–15)
BUN: 15 mg/dL (ref 6–20)
CHLORIDE: 103 mmol/L (ref 101–111)
CO2: 23 mmol/L (ref 22–32)
CREATININE: 0.87 mg/dL (ref 0.61–1.24)
Calcium: 8.1 mg/dL — ABNORMAL LOW (ref 8.9–10.3)
GFR calc Af Amer: >60 mL/min (ref >60)
Glucose, Bld: 123 mg/dL — ABNORMAL HIGH (ref 65–99)
Potassium: 3.9 mmol/L (ref 3.5–5.1)
SODIUM: 138 mmol/L (ref 135–145)
Total Bilirubin: 4.6 mg/dL — ABNORMAL HIGH (ref 0.3–1.2)
Total Protein: 7 g/dL (ref 6.5–8.1)

## 2017-01-12 LAB — I-STAT TROPONIN, ED: Troponin i, poc: 0.01 ng/mL (ref 0.00–0.08)

## 2017-01-12 LAB — PROTIME-INR
INR: 1.28
Prothrombin Time: 15.9 seconds — ABNORMAL HIGH (ref 11.4–15.2)

## 2017-01-12 MED ORDER — SODIUM CHLORIDE 0.9% FLUSH
3.0000 mL | Freq: Two times a day (BID) | INTRAVENOUS | Status: DC
  Administered 2017-01-12 – 2017-01-13 (×2): 3 mL via INTRAVENOUS

## 2017-01-12 MED ORDER — ONDANSETRON HCL 4 MG/2ML IJ SOLN: 4.0000 mg | Freq: Four times a day (QID) | INTRAMUSCULAR | Status: DC | PRN

## 2017-01-12 MED ORDER — SPIRONOLACTONE 100 MG PO TABS
100.0000 mg | ORAL_TABLET | Freq: Two times a day (BID) | ORAL | Status: DC
  Administered 2017-01-12 – 2017-01-13 (×2): 100 mg via ORAL
  Filled 2017-01-12 (×2): qty 1

## 2017-01-12 MED ORDER — GUAIFENESIN-DM 100-10 MG/5ML PO SYRP
5.0000 mL | ORAL_SOLUTION | ORAL | Status: DC | PRN
  Administered 2017-01-12: 5 mL via ORAL
  Filled 2017-01-12: qty 5

## 2017-01-12 MED ORDER — HYDROCODONE-ACETAMINOPHEN 5-325 MG PO TABS
1.0000 | ORAL_TABLET | ORAL | Status: DC | PRN
  Administered 2017-01-12: 1 via ORAL
  Filled 2017-01-12: qty 1

## 2017-01-12 MED ORDER — ONDANSETRON HCL 4 MG/2ML IJ SOLN
4.0000 mg | Freq: Once | INTRAMUSCULAR | Status: AC
  Administered 2017-01-12: 4 mg via INTRAVENOUS
  Filled 2017-01-12: qty 2

## 2017-01-12 MED ORDER — FLUOXETINE HCL 20 MG PO CAPS
20.0000 mg | ORAL_CAPSULE | Freq: Every day | ORAL | Status: DC
  Administered 2017-01-13: 20 mg via ORAL
  Filled 2017-01-12 (×5): qty 1

## 2017-01-12 MED ORDER — ONDANSETRON HCL 4 MG PO TABS: 4.0000 mg | ORAL_TABLET | Freq: Four times a day (QID) | ORAL | Status: DC | PRN

## 2017-01-12 MED ORDER — DILTIAZEM HCL 30 MG PO TABS
30.0000 mg | ORAL_TABLET | Freq: Three times a day (TID) | ORAL | Status: DC
  Administered 2017-01-12 – 2017-01-13 (×2): 30 mg via ORAL
  Filled 2017-01-12 (×2): qty 1

## 2017-01-12 MED ORDER — FUROSEMIDE 20 MG PO TABS
60.0000 mg | ORAL_TABLET | Freq: Two times a day (BID) | ORAL | Status: DC
  Administered 2017-01-12 – 2017-01-13 (×2): 60 mg via ORAL
  Filled 2017-01-12 (×2): qty 1

## 2017-01-12 MED ORDER — VITAMIN B-1 100 MG PO TABS
50.0000 mg | ORAL_TABLET | Freq: Every day | ORAL | Status: DC
  Administered 2017-01-12 – 2017-01-13 (×2): 50 mg via ORAL
  Filled 2017-01-12 (×2): qty 1

## 2017-01-12 MED ORDER — CARVEDILOL 3.125 MG PO TABS
6.2500 mg | ORAL_TABLET | Freq: Two times a day (BID) | ORAL | Status: DC
  Administered 2017-01-12 – 2017-01-13 (×2): 6.25 mg via ORAL
  Filled 2017-01-12 (×2): qty 2

## 2017-01-12 MED ORDER — PANTOPRAZOLE SODIUM 40 MG PO TBEC
40.0000 mg | DELAYED_RELEASE_TABLET | Freq: Every day | ORAL | Status: DC
  Administered 2017-01-12 – 2017-01-13 (×2): 40 mg via ORAL
  Filled 2017-01-12 (×2): qty 1

## 2017-01-12 MED ORDER — SENNOSIDES-DOCUSATE SODIUM 8.6-50 MG PO TABS: 1.0000 | ORAL_TABLET | Freq: Every evening | ORAL | Status: DC | PRN

## 2017-01-12 MED ORDER — DILTIAZEM LOAD VIA INFUSION
15.0000 mg | Freq: Once | INTRAVENOUS | Status: AC
  Administered 2017-01-12: 15 mg via INTRAVENOUS
  Filled 2017-01-12: qty 15

## 2017-01-12 MED ORDER — SODIUM CHLORIDE 0.9 % IV SOLN: 250.0000 mL | INTRAVENOUS | Status: DC | PRN

## 2017-01-12 MED ORDER — SODIUM CHLORIDE 0.9% FLUSH: 3.0000 mL | INTRAVENOUS | Status: DC | PRN

## 2017-01-12 MED ORDER — ADULT MULTIVITAMIN W/MINERALS CH
1.0000 | ORAL_TABLET | Freq: Every day | ORAL | Status: DC
  Administered 2017-01-12 – 2017-01-13 (×2): 1 via ORAL
  Filled 2017-01-12 (×2): qty 1

## 2017-01-12 MED ORDER — BUSPIRONE HCL 5 MG PO TABS
7.5000 mg | ORAL_TABLET | Freq: Two times a day (BID) | ORAL | Status: DC
  Administered 2017-01-12 – 2017-01-13 (×2): 7.5 mg via ORAL
  Filled 2017-01-12 (×2): qty 2

## 2017-01-12 MED ORDER — LACTULOSE 10 GM/15ML PO SOLN
20.0000 g | Freq: Two times a day (BID) | ORAL | Status: DC
  Administered 2017-01-12 – 2017-01-13 (×2): 20 g via ORAL
  Filled 2017-01-12 (×2): qty 30

## 2017-01-12 MED ORDER — DILTIAZEM HCL-DEXTROSE 100-5 MG/100ML-% IV SOLN (PREMIX)
5.0000 mg/h | INTRAVENOUS | Status: DC
  Administered 2017-01-12: 5 mg/h via INTRAVENOUS
  Filled 2017-01-12: qty 100

## 2017-01-12 MED ORDER — RIFAXIMIN 550 MG PO TABS
550.0000 mg | ORAL_TABLET | Freq: Two times a day (BID) | ORAL | Status: DC
  Administered 2017-01-12 – 2017-01-13 (×2): 550 mg via ORAL
  Filled 2017-01-12 (×2): qty 1

## 2017-01-12 NOTE — ED Notes (Signed)
Pt is difficult stick and unable to obtain blood with IV stick.  Phlebotomy aware.
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
## 2017-01-12 NOTE — Sedation Documentation (Signed)
Procedure not completed. Pt hr became tachy with irregular rhythm. PA assessed along with Dr. Delbert Phenix and sent to ER for evaluation

## 2017-01-12 NOTE — ED Notes (Signed)
Took pt off O2. O2 sats now at 100%

## 2017-01-12 NOTE — H&P (Signed)
History and Physical    DIMAS SCHECK IOX:[id number] DOB: [birth year] DOA: 01/12/2017  Referring MD/NP/PA: Dorie Rank, EDP PCP: [name], MD  Patient coming from: Home  Chief Complaint: Fast heart rate  HPI: [name] is a 56 y.o. male with history of cirrhosis, atrial fibrillation, anxiety, depression, hypertension among other issues who presents to the hospital today to the radiology department for a scheduled thoracentesis.  While there he was noted to be tachycardic and sent to the ED for evaluation.  While in the emergency department he was noted to be in A. fib with rates in the 140s-150s, has been started on a Cardizem drip and his rates have decreased into the 70s-80s since.  Thoracentesis was delayed.  We are asked to admit him for further evaluation.  He states he did notice his heart racing and had some palpitations, his main complaint however has been that he has been short of breath.  It is important to know that he was just discharged from the hospital on November 15 at which time he did have an ultrasound-guided thoracentesis on 11/9.  Admission requested  Past Medical/Surgical History: Past Medical History:  Diagnosis Date  . Anxiety   . Cardiomyopathy ([place])   . Chronic back pain   . Cirrhosis ([place])   . DDD (degenerative disc disease), lumbosacral   . Depression   . History of alcohol abuse   . Hx of cardiac cath 1996  . Hyperlipidemia   . Hypertension   . Liver failure ([place])   . PAF (paroxysmal atrial fibrillation) ([place])   . Recurrent pleural effusion on right   . Stroke [facility]) 2003    Past Surgical History:  Procedure Laterality Date  . CARDIAC CATHETERIZATION  1996  . COLONOSCOPY N/A 03/25/2015   Procedure: COLONOSCOPY;  Surgeon: Rogene Houston, MD;  Location: [facility];  Service: Endoscopy;  Laterality: N/A;  1130  . ESOPHAGOGASTRODUODENOSCOPY N/A 03/25/2015   Procedure: ESOPHAGOGASTRODUODENOSCOPY (EGD);  Surgeon: Rogene Houston, MD;   Location: [facility];  Service: Endoscopy;  Laterality: N/A;    Social History:  reports that  has never smoked. He quit smokeless tobacco use about 2 years ago. His smokeless tobacco use included chew. He reports that he does not drink alcohol or use drugs.  Allergies: Allergies  Allergen Reactions  . Albumin (Human) Hives and Itching  . Bee Venom Anaphylaxis and Nausea Only  . Bee Venom Anaphylaxis, Nausea And Vomiting and Swelling    Throat swelling  . Other Hives    Pt states there was a fluid through IV he was getting while getting fluid taken off his stomach. It gave him hives    Family History:  Family History  Problem Relation Age of Onset  . Heart disease Mother   . Cancer Father        throat cancer  . Cancer Brother        melanoma  . Thyroid disease Sister     Prior to Admission medications   Medication Sig Start Date End Date Taking? Authorizing Provider  busPIRone (BUSPAR) 7.5 MG tablet Take 1 tablet (7.5 mg total) by mouth 2 (two) times daily. FOR ANXIETY 07/06/16  Yes Coffee, Modena Nunnery, MD  CARAFATE 1 GM/10ML suspension TAKE TWO TEASPOONSFUL (10 ML) BY MOUTH FOUR TIMES DAILY - WITH MEALS AND AT BEDTIME 07/27/16  Yes Cushman, Modena Nunnery, MD  carvedilol (COREG) 6.25 MG tablet Take 1 tablet (6.25 mg total) by mouth 2 (two)  times daily. 07/06/16  Yes [place], Modena Nunnery, MD  diltiazem (CARDIZEM) 30 MG tablet Take 1 tablet (30 mg total) every 8 (eight) hours by mouth. 12/29/16  Yes Memon, Jolaine Artist, MD  EPINEPHrine 0.3 mg/0.3 mL IJ SOAJ injection Inject 0.3 mLs (0.3 mg total) into the muscle once. 06/11/15  Yes [place], Modena Nunnery, MD  FLUoxetine (PROZAC) 20 MG tablet Take 1 tablet (20 mg total) by mouth daily. 07/06/16  Yes Bowman, Modena Nunnery, MD  furosemide (LASIX) 40 MG tablet Take 1.5 tablets (60 mg total) 2 (two) times daily by mouth. 12/29/16 01/28/17 Yes Memon, Jolaine Artist, MD  lactulose (CHRONULAC) 10 GM/15ML solution TAKE 22.5ML BY MOUTH TWICE DAILY 08/29/16  Yes Bishop,  Modena Nunnery, MD  Multiple Vitamin (MULTIVITAMIN WITH MINERALS) TABS tablet Take 1 tablet by mouth daily.   Yes [provider]  omeprazole (PRILOSEC) 20 MG capsule Take 1 capsule (20 mg total) by mouth 2 (two) times daily. 07/06/16  Yes [place], Modena Nunnery, MD  rifaximin (XIFAXAN) 550 MG TABS tablet Take 1 tablet (550 mg total) 2 (two) times daily by mouth. 12/29/16  Yes Memon, Jolaine Artist, MD  sildenafil (VIAGRA) 100 MG tablet Take 0.5 tablets (50 mg total) by mouth daily as needed for erectile dysfunction. Take 1/2 tablet ( 50 mg ) 30 minutes prior to sexual engagement. 09/07/16  Yes BranchAlphonse Guild, MD  spironolactone (ALDACTONE) 100 MG tablet Take 1 tablet (100 mg total) 2 (two) times daily by mouth. 12/29/16  Yes Memon, Jolaine Artist, MD  thiamine (VITAMIN B-1) 50 MG tablet Take 50 mg by mouth daily.    Yes [provider]    Review of Systems:  Constitutional: Denies fever, chills, diaphoresis, appetite change and fatigue.  HEENT: Denies photophobia, eye pain, redness, hearing loss, ear pain, congestion, sore throat, rhinorrhea, sneezing, mouth sores, trouble swallowing, neck pain, neck stiffness and tinnitus.   Respiratory: Denies  cough, chest tightness,  and wheezing.   Cardiovascular: Denies chest pain, Gastrointestinal: Denies nausea, vomiting, abdominal pain, diarrhea, constipation, blood in stool and abdominal distention.  Genitourinary: Denies dysuria, urgency, frequency, hematuria, flank pain and difficulty urinating.  Endocrine: Denies: hot or cold intolerance, sweats, changes in hair or nails, polyuria, polydipsia. Musculoskeletal: Denies myalgias, back pain, joint swelling, arthralgias and gait problem.  Skin: Denies pallor, rash and wound.  Neurological: Denies dizziness, seizures, syncope, weakness, light-headedness, numbness and headaches.  Hematological: Denies adenopathy. Easy bruising, personal or family bleeding history  Psychiatric/Behavioral: Denies suicidal  ideation, mood changes, confusion, nervousness, sleep disturbance and agitation    Physical Exam: Vitals:   01/12/17 1500 01/12/17 1530 01/12/17 1641 01/12/17 1700  BP: 133/90 134/83  (!) 126/93  Pulse: 91 92  93  Resp: 15 14  (!) 27  Temp:   (!) 97.4 F (36.3 C)   TempSrc:   Oral   SpO2: 97% 97%  94%  Weight:      Height:         Constitutional: NAD, calm, comfortable Eyes: PERRL, lids and conjunctivae normal ENMT: Mucous membranes are moist. Posterior pharynx clear of any exudate or lesions.Normal dentition.  Neck: normal, supple, no masses, no thyromegaly Respiratory: Decreased breath sounds to entire right lung fields Cardiovascular: Irregular rhythm, rate currently in the 70s, no murmurs, rubs or gallops Abdomen: no tenderness, no masses palpated. No hepatosplenomegaly. Bowel sounds positive.  Musculoskeletal: no clubbing / cyanosis. No joint deformity upper and lower extremities. Good ROM, no contractures. Normal muscle tone.  2+ pitting edema bilaterally Skin: no rashes, lesions,  ulcers. No induration Neurologic: CN 2-12 grossly intact. Sensation intact, DTR normal. Strength 5/5 in all 4.  Psychiatric: Normal judgment and insight. Alert and oriented x 3. Normal mood.    Labs on Admission: I have personally reviewed the following labs and imaging studies  CBC: Recent Labs  Lab 01/12/17 0959  WBC 5.5  HGB 13.3  HCT 40.3  MCV 106.6*  PLT 76*   Basic Metabolic Panel: Recent Labs  Lab 01/12/17 0959  NA 138  K 3.9  CL 103  CO2 23  GLUCOSE 123*  BUN 15  CREATININE 0.87  CALCIUM 8.1*   GFR: Estimated Creatinine Clearance: 112 mL/min (by C-G formula based on SCr of 0.87 mg/dL). Liver Function Tests: Recent Labs  Lab 01/12/17 0959  AST 59*  ALT 28  ALKPHOS 168*  BILITOT 4.6*  PROT 7.0  ALBUMIN 3.1*   No results for input(s): LIPASE, AMYLASE in the last 168 hours. No results for input(s): AMMONIA in the last 168 hours. Coagulation Profile: Recent  Labs  Lab 01/12/17 0959  INR 1.28   Cardiac Enzymes: No results for input(s): CKTOTAL, CKMB, CKMBINDEX, TROPONINI in the last 168 hours. BNP (last 3 results) No results for input(s): PROBNP in the last 8760 hours. HbA1C: No results for input(s): HGBA1C in the last 72 hours. CBG: No results for input(s): GLUCAP in the last 168 hours. Lipid Profile: No results for input(s): CHOL, HDL, LDLCALC, TRIG, CHOLHDL, LDLDIRECT in the last 72 hours. Thyroid Function Tests: No results for input(s): TSH, T4TOTAL, FREET4, T3FREE, THYROIDAB in the last 72 hours. Anemia Panel: No results for input(s): VITAMINB12, FOLATE, FERRITIN, TIBC, IRON, RETICCTPCT in the last 72 hours. Urine analysis:    Component Value Date/Time   COLORURINE AMBER (A) 12/14/2015 1439   APPEARANCEUR CLEAR 12/14/2015 1439   LABSPEC 1.015 12/14/2015 1439   PHURINE 6.0 12/14/2015 1439   GLUCOSEU TRACE (A) 12/14/2015 1439   HGBUR 3+ (A) 12/14/2015 1439   BILIRUBINUR NEGATIVE 12/14/2015 1439   KETONESUR TRACE (A) 12/14/2015 1439   PROTEINUR 1+ (A) 12/14/2015 1439   UROBILINOGEN 1.0 07/26/2014 2231   NITRITE NEGATIVE 12/14/2015 1439   LEUKOCYTESUR NEGATIVE 12/14/2015 1439   Sepsis Labs: @LABRCNTIP (procalcitonin:4,lacticidven:4) ) Recent Results (from the past 240 hour(s))  Culture, body fluid-bottle     Status: None   Collection Time: 01/06/17 10:20 AM  Result Value Ref Range Status   Specimen Description PLEURAL COLLECTED BY DOCTOR DR MAXWELL  Final   Special Requests BOTTLES DRAWN AEROBIC AND ANAEROBIC 10CC EACH  Final   Culture NO GROWTH 5 DAYS  Final   Report Status 01/11/2017 FINAL  Final  Gram stain     Status: None   Collection Time: 01/06/17 10:52 AM  Result Value Ref Range Status   Specimen Description PLEURAL  Final   Special Requests NONE  Final   Gram Stain   Final    WBC PRESENT, PREDOMINANTLY MONONUCLEAR NO ORGANISMS SEEN CYTOSPIN SMEAR    Report Status 01/06/2017 FINAL  Final     Radiological  Exams on Admission: [place] Chest (pleural Effusion)  Result Date: 01/12/2017 CLINICAL DATA:  Hepatic cirrhosis, shortness of breath, recurrent RIGHT pleural effusion EXAM: CHEST ULTRASOUND COMPARISON:  01/06/2017 FINDINGS: Large recurrent RIGHT pleural effusion is identified. However, due to presence of atrial fibrillation with rapid ventricular response at presentation, patient is unable to undergo RIGHT thoracentesis at the current time. Patient was taken to the emergency room for assessment. RIGHT thoracentesis will be performed under ultrasound guidance when the  patient's clinical condition permits. IMPRESSION: Recurrent large RIGHT pleural effusion. Electronically Signed   By: Lavonia Dana M.D.   On: 01/12/2017 09:51   Dg Chest Portable 1 View  Result Date: 01/12/2017 CLINICAL DATA:  Shortness of breath.  History of pleural effusion EXAM: PORTABLE CHEST 1 VIEW COMPARISON:  01/06/2017 FINDINGS: There is a large right pleural effusion. Right lower lobe atelectasis or infiltrate. Cardiomegaly. No confluent opacity or effusion on the left. No acute bony abnormality. IMPRESSION: Large right pleural effusion, slightly increased since prior study. Right lower lobe atelectasis or infiltrate. Cardiomegaly. Electronically Signed   By: Rolm Baptise M.D.   On: 01/12/2017 09:59    EKG: Independently reviewed.  A. fib at a rate of 177  Assessment/Plan Principal Problem:   Atrial fibrillation with RVR (HCC) Active Problems:   Acute hypoxemic respiratory failure (HCC)   Essential hypertension   Anxiety and depression   Hyperlipidemia   Cirrhosis of liver with ascites (HCC)   Chronic combined systolic and diastolic CHF (congestive heart failure) (HCC)   Thrombocytopenia (HCC)    Atrial fibrillation with RVR -Patient is known to have A. fib. -He has not missed doses of his medications. -Likely due to large right thoracentesis and hypoxemia. -Started on Cardizem drip in the ED, by the time I see him he  is already in the ICU with heart rates in the 70s-80s, will resume his home doses of Coreg and Cardizem and wean off Cardizem drip. -He is not anticoagulated due to his history of cirrhosis and GI bleed.  Acute hypoxemic respiratory failure -On account of large right pleural effusion. 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 -We will schedule repeat thoracentesis for tomorrow. -Oxygen as required.  Cirrhosis -With significant tense ascites. -We will also schedule for paracentesis tomorrow for therapeutic purposes. -There has been discussions with GI for a TIPS procedure.  Continue to follow-up outpatient with GI for scheduling.  Thrombocytopenia -Due to cirrhosis. -Platelet count on admission is 76 up from 20s-30s on prior hospitalization. -Avoid heparin products.  Acute on chronic combined CHF -Echo from earlier this month shows an ejection fraction of 45% and not technically sufficient to evaluate diastolic function  -Continue home dose of oral Lasix 60 mg twice daily, suspect most of his respiratory distress is due to pleural effusion more so than pulmonary edema.   DVT prophylaxis: SCDs Code Status: Full code Family Communication: Patient only Disposition Plan: Keep in ICU tonight, for thoracentesis and paracentesis in a.m., home once medically stable Consults called: None Admission status: Inpatient   Time Spent: 90 minutes  [name] Isaac Bliss MD [facility] Pager [phone]  If 7PM-7AM, please contact night-coverage [email or web address] Password [facility]  01/12/2017, 6:59 PM

## 2017-01-12 NOTE — Sedation Documentation (Signed)
Procedure not done d/t pt

## 2017-01-12 NOTE — Progress Notes (Signed)
Patient ID: [name], male   DOB: 03-02-1960, 56 y.o.   MRN: [id number] Patient presented today for his scheduled outpatient thoracentesis.  He states he is not feeling well and is shaking significantly more than usual.  He states he is having some N/V and difficulty eating.  He is SOB.  When he was placed on a monitor his HR was noted to be elevated.  Upon auscultation his HR was noted to be irregular and very tachycardic.  His primary GI was contacted and agreed upon sending him to the ED prior to proceeding with his thoracentesis.  We will be available to do his thoracentesis later today if possible once his HR has been addressed.  Henreitta Cea 9:53 AM 01/12/2017

## 2017-01-12 NOTE — ED Triage Notes (Signed)
Pt was in radiology to have thorocentesis this am and was found in Afib with RVR. Pt states some SOB, denies pain.

## 2017-01-13 ENCOUNTER — Inpatient Hospital Stay (HOSPITAL_COMMUNITY): Payer: Medicare Other

## 2017-01-13 LAB — COMPREHENSIVE METABOLIC PANEL
ALBUMIN: 2.5 g/dL — AB (ref 3.5–5.0)
ALK PHOS: 133 U/L — AB (ref 38–126)
ALT: 21 U/L (ref 17–63)
AST: 48 U/L — AB (ref 15–41)
Anion gap: 8 (ref 5–15)
BILIRUBIN TOTAL: 4 mg/dL — AB (ref 0.3–1.2)
BUN: 18 mg/dL (ref 6–20)
CO2: 25 mmol/L (ref 22–32)
Calcium: 7.4 mg/dL — ABNORMAL LOW (ref 8.9–10.3)
Chloride: 104 mmol/L (ref 101–111)
Creatinine, Ser: 1.06 mg/dL (ref 0.61–1.24)
GFR calc Af Amer: >60 mL/min (ref >60)
GFR calc non Af Amer: >60 mL/min (ref >60)
GLUCOSE: 104 mg/dL — AB (ref 65–99)
POTASSIUM: 4 mmol/L (ref 3.5–5.1)
Sodium: 137 mmol/L (ref 135–145)
TOTAL PROTEIN: 5.7 g/dL — AB (ref 6.5–8.1)

## 2017-01-13 LAB — CBC
HEMATOCRIT: 33.3 % — AB (ref 39.0–52.0)
Hemoglobin: 11.1 g/dL — ABNORMAL LOW (ref 13.0–17.0)
MCH: 35.7 pg — AB (ref 26.0–34.0)
MCHC: 33.3 g/dL (ref 30.0–36.0)
MCV: 107.1 fL — AB (ref 78.0–100.0)
Platelets: 71 10*3/uL — ABNORMAL LOW (ref 150–400)
RBC: 3.11 MIL/uL — ABNORMAL LOW (ref 4.22–5.81)
RDW: 16.2 % — AB (ref 11.5–15.5)
WBC: 5.6 10*3/uL (ref 4.0–10.5)

## 2017-01-13 NOTE — Progress Notes (Signed)
AVS reviewed with patient.  Verbalized understanding of discharge instructions, physician follow-up, medications.  IVs removed.  Sites WNL.  Patient stable awaiting transportation home.

## 2017-01-13 NOTE — Discharge Summary (Signed)
Physician Discharge Summary  [name] CBS:[id number] DOB: 03/22/1960 DOA: 01/12/2017  PCP: [name]  Admit date: 01/12/2017 Discharge date: 01/13/2017  Time spent: 45 minutes  Recommendations for Outpatient Follow-up:  -Will be discharged home today. -Advised to follow-up with GI as scheduled.  Discharge Diagnoses:  Principal Problem:   Atrial fibrillation with RVR ([name]) Active Problems:   Acute hypoxemic respiratory failure (HCC)   Essential hypertension   Anxiety and depression   Hyperlipidemia   Cirrhosis of liver with ascites (HCC)   Chronic combined systolic and diastolic CHF (congestive heart failure) (HCC)   Thrombocytopenia (HCC)   Discharge Condition: Stable and improved  Filed Weights   01/12/17 0916 01/13/17 0500  Weight: 99.8 kg (220 lb) 97.5 kg (214 lb 15.2 oz)    History of present illness:   [name] is a 56 y.o. male with history of cirrhosis, atrial fibrillation, anxiety, depression, hypertension among other issues who presents to the hospital today to the radiology department for a scheduled thoracentesis.  While there he was noted to be tachycardic and sent to the ED for evaluation.  While in the emergency department he was noted to be in A. fib with rates in the 140s-150s, has been started on a Cardizem drip and his rates have decreased into the 70s-80s since.  Thoracentesis was delayed.  We are asked to admit him for further evaluation.  He states he did notice his heart racing and had some palpitations, his main complaint however has been that he has been short of breath.  It is important to know that he was just discharged from the hospital on November 15 at which time he did have an ultrasound-guided thoracentesis on 11/9.  Admission requested    Hospital Course:   Atrial fibrillation with rapid ventricular response -Likely due to large right thoracentesis and hypoxemia. -Was initially placed on a Cardizem drip  subsequently weaned to his home dose of Cardizem and Coreg, rates have been stable. -He is not anticoagulated due to his history of cirrhosis and GI bleeds  Acute hypoxemic respiratory failure -On account of large recurrent right pleural effusion. 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 -Wean down to nasal cannula.  Cirrhosis -With significant tense ascites. -He is status post paracentesis with removal of 3 L. -Continue outpatient follow-up with GI.  Thrombocytopenia -Due to cirrhosis -Stable, avoid heparin products in the future.  Procedures:  Paracentesis and thoracentesis  Consultations:  Radiology  GI  Discharge Instructions  Discharge Instructions    Diet - low sodium heart healthy   Complete by:  As directed    Increase activity slowly   Complete by:  As directed      Allergies as of 01/13/2017      Reactions   Albumin (human) Hives, Itching   Bee Venom Anaphylaxis, Nausea Only   Bee Venom Anaphylaxis, Nausea And Vomiting, Swelling   Throat swelling   Other Hives   Pt states there was a fluid through IV he was getting while getting fluid taken off his stomach. It gave him hives      Medication List    STOP taking these medications   EPINEPHrine 0.3 mg/0.3 mL Soaj injection Commonly known as:  EPI-PEN     TAKE these medications   busPIRone 7.5 MG tablet Commonly known as:  BUSPAR Take 1 tablet (7.5 mg total) by mouth 2 (two) times daily. FOR ANXIETY  CARAFATE 1 GM/10ML suspension Generic drug:  sucralfate TAKE TWO TEASPOONSFUL (10 ML) BY MOUTH FOUR TIMES DAILY - WITH MEALS AND AT BEDTIME   carvedilol 6.25 MG tablet Commonly known as:  COREG Take 1 tablet (6.25 mg total) by mouth 2 (two) times daily.   diltiazem 30 MG tablet Commonly known as:  CARDIZEM Take 1 tablet (30 mg total) every 8 (eight) hours by mouth.   FLUoxetine 20 MG tablet Commonly known as:  PROZAC Take 1 tablet (20  mg total) by mouth daily.   furosemide 40 MG tablet Commonly known as:  LASIX Take 1.5 tablets (60 mg total) 2 (two) times daily by mouth.   lactulose 10 GM/15ML solution Commonly known as:  CHRONULAC TAKE 22.5ML BY MOUTH TWICE DAILY   multivitamin with minerals Tabs tablet Take 1 tablet by mouth daily.   omeprazole 20 MG capsule Commonly known as:  PRILOSEC Take 1 capsule (20 mg total) by mouth 2 (two) times daily.   rifaximin 550 MG Tabs tablet Commonly known as:  XIFAXAN Take 1 tablet (550 mg total) 2 (two) times daily by mouth.   sildenafil 100 MG tablet Commonly known as:  VIAGRA Take 0.5 tablets (50 mg total) by mouth daily as needed for erectile dysfunction. Take 1/2 tablet ( 50 mg ) 30 minutes prior to sexual engagement.   spironolactone 100 MG tablet Commonly known as:  ALDACTONE Take 1 tablet (100 mg total) 2 (two) times daily by mouth.   thiamine 50 MG tablet Commonly known as:  VITAMIN B-1 Take 50 mg by mouth daily.      Allergies  Allergen Reactions  . Albumin (Human) Hives and Itching  . Bee Venom Anaphylaxis and Nausea Only  . Bee Venom Anaphylaxis, Nausea And Vomiting and Swelling    Throat swelling  . Other Hives    Pt states there was a fluid through IV he was getting while getting fluid taken off his stomach. It gave him hives   Follow-up Information    [place], [name]. Schedule an appointment as soon as possible for a visit in 2 week(s).   Specialty:  Family Medicine Contact information: [address] [place] [address] [phone]            The results of significant diagnostics from this hospitalization (including imaging, microbiology, ancillary and laboratory) are listed below for reference.    Significant Diagnostic Studies: Dg Chest 1 View  Result Date: 01/13/2017 CLINICAL DATA:  Cirrhosis, ascites, recurrent RIGHT pleural effusion, post RIGHT thoracentesis EXAM: CHEST 1 VIEW COMPARISON:  01/12/2017 FINDINGS:  Stable heart size and mediastinal contours. Persistent large at RIGHT pleural effusion and compressive atelectasis of the lower RIGHT lung despite preceding thoracentesis with removal of 1.2 L of RIGHT pleural fluid. No pneumothorax. Small LEFT pleural effusion. No acute osseous findings. IMPRESSION: No pneumothorax following RIGHT thoracentesis. Persistent large RIGHT pleural effusion and basilar atelectasis. Electronically Signed   By: Lavonia Dana M.D.   On: 01/13/2017 12:55   Dg Chest 1 View  Result Date: 01/06/2017 CLINICAL DATA:  Post thoracentesis. EXAM: CHEST 1 VIEW COMPARISON:  Chest x-ray dated 12/28/2016. FINDINGS: Right lower lung opacity is not significantly changed compared to the earlier exam. No pneumothorax seen. Left lung is clear. Heart size and mediastinal contours are stable. IMPRESSION: Status post thoracentesis. The right lower lung opacity is not significantly changed compared to the previous study of 12/28/2016 suggesting residual effusion or consolidation. No pneumothorax seen. Electronically Signed  By: Franki Cabot M.D.   On: 01/06/2017 10:59   Ct Chest Wo Contrast  Result Date: 12/23/2016 CLINICAL DATA:  Pleural effusion EXAM: CT CHEST WITHOUT CONTRAST TECHNIQUE: Multidetector CT imaging of the chest was performed following the standard protocol without IV contrast. COMPARISON:  Chest radiograph 12/22/2016 Chest CT 07/18/2016 FINDINGS: Cardiovascular: Heart size is normal. No pericardial effusion. There are coronary artery calcifications. Normal course and caliber of the aorta. Mild aortic calcific atherosclerosis. Mediastinum/Nodes: The mediastinum is shifted to the left. There is no mediastinal adenopathy. The thyroid gland is normal. Lungs/Pleura: There is a massive right pleural effusion occupying nearly all of the right hemithorax. There is minimal maintained right upper lobe aeration. There is complete collapse of the right middle and lower lobes. The left lung is clear.  Upper Abdomen: The liver is shrunken and nodular with large volume perihepatic ascites. There is moderate volume splenic ascites. Visualized portions of the kidneys are normal. Normal adrenal glands. The visible pancreas is unremarkable. Musculoskeletal: No chest wall mass or suspicious bone lesions identified. IMPRESSION: 1. Massive right pleural effusion occupying approximately 90% of the right hemithoracic volume and causing leftward shift of mediastinal structures. There is mild persistent aeration of the right upper lobe, but the right middle and lower lobes are completely collapsed. 2. Shrunken, nodular liver with large volume upper abdominal ascites, consistent with hepatic cirrhosis. 3. Coronary artery and aortic atherosclerosis (ICD10-I70.0). Electronically Signed   By: Ulyses Jarred M.D.   On: 12/23/2016 00:10   [place] Chest (pleural Effusion)  Result Date: 01/12/2017 CLINICAL DATA:  Hepatic cirrhosis, shortness of breath, recurrent RIGHT pleural effusion EXAM: CHEST ULTRASOUND COMPARISON:  01/06/2017 FINDINGS: Large recurrent RIGHT pleural effusion is identified. However, due to presence of atrial fibrillation with rapid ventricular response at presentation, patient is unable to undergo RIGHT thoracentesis at the current time. Patient was taken to the emergency room for assessment. RIGHT thoracentesis will be performed under ultrasound guidance when the patient's clinical condition permits. IMPRESSION: Recurrent large RIGHT pleural effusion. Electronically Signed   By: Lavonia Dana M.D.   On: 01/12/2017 09:51   US Paracentesis  Result Date: 01/13/2017 INDICATION: Cirrhosis, recurrent ascites EXAM: ULTRASOUND GUIDED THERAPEUTIC PARACENTESIS MEDICATIONS: None. COMPLICATIONS: None immediate. PROCEDURE: Procedure, benefits, and risks of procedure were discussed with patient. Written informed consent for procedure was obtained. Time out protocol followed. Adequate collection of ascites localized by  ultrasound in RIGHT lower quadrant. Skin prepped and draped in usual sterile fashion. Skin and soft tissues anesthetized with 10 mL of 1% lidocaine. 5 [place] Yueh catheter placed into peritoneal cavity. 3 L of yellow colored ascitic fluid aspirated by vacuum bottle suction. Procedure tolerated well by patient without immediate complication. FINDINGS: As above IMPRESSION: Successful ultrasound-guided paracentesis yielding 3 liters of peritoneal fluid. Electronically Signed   By: Lavonia Dana M.D.   On: 01/13/2017 14:06   US Paracentesis  Result Date: 12/23/2016 INDICATION: Cirrhosis, ascites EXAM: ULTRASOUND GUIDED DIAGNOSTIC AND THERAPEUTIC PARACENTESIS MEDICATIONS: None. COMPLICATIONS: None immediate. PROCEDURE: Procedure, benefits, and risks of procedure were discussed with patient. Written informed consent for procedure was obtained. Time out protocol followed. Adequate collection of ascites localized by ultrasound in RIGHT lower quadrant. Skin prepped and draped in usual sterile fashion. Skin and soft tissues anesthetized with 10 mL of 1% lidocaine. 5 [place] Yueh catheter placed into peritoneal cavity. 3.5 L of yellow ascitic fluid aspirated by vacuum bottle suction. Procedure tolerated well by patient without immediate complication. FINDINGS: A total of approximately 3.5 L of  ascitic fluid was removed. Samples were sent to the laboratory as requested by the clinical team. IMPRESSION: Successful ultrasound-guided paracentesis yielding 3.5 liters of peritoneal fluid. Electronically Signed   By: Lavonia Dana M.D.   On: 12/23/2016 16:16   Dg Chest Portable 1 View  Result Date: 01/12/2017 CLINICAL DATA:  Shortness of breath.  History of pleural effusion EXAM: PORTABLE CHEST 1 VIEW COMPARISON:  01/06/2017 FINDINGS: There is a large right pleural effusion. Right lower lobe atelectasis or infiltrate. Cardiomegaly. No confluent opacity or effusion on the left. No acute bony abnormality. IMPRESSION: Large right  pleural effusion, slightly increased since prior study. Right lower lobe atelectasis or infiltrate. Cardiomegaly. Electronically Signed   By: Rolm Baptise M.D.   On: 01/12/2017 09:59   Dg Chest Port 1 View  Result Date: 12/28/2016 CLINICAL DATA:  Status post right-sided thoracentesis. Recurrent right pleural effusion. History of cirrhosis, cardiomyopathy. EXAM: PORTABLE CHEST 1 VIEW COMPARISON:  Chest x-ray of December 27, 2016 FINDINGS: There remains a large right pleural effusion. The pleural fluid volume present does not appear significantly reduced. There is no pneumothorax. The left lung is well-expanded and clear. The cardiac silhouette is enlarged. The pulmonary vascularity is engorged. IMPRESSION: Persistent large right-sided pleural effusion. No postprocedure pneumothorax following thoracentesis. CHF. Electronically Signed   By: David  Martinique M.D.   On: 12/28/2016 13:28   Dg Chest Port 1 View  Result Date: 12/27/2016 CLINICAL DATA:  56 year old male with increasing shortness of breath and abdominal swelling. Subsequent encounter. EXAM: PORTABLE CHEST 1 VIEW COMPARISON:  12/25/2016 chest x-ray.  12/22/2016 chest CT. FINDINGS: Very large right-sided pleural effusion has increased minimally in size compared to prior exam. Aeration right upper lobe. Central pulmonary vascular prominence. Mediastinal and cardiac silhouette incompletely assessed secondary to the large pleural effusion. There may be minimal shift to the left secondary to the large right sided pleural effusion. Right acromioclavicular joint degenerative changes. IMPRESSION: Very large right-sided pleural effusion has increased minimally in size compared to prior exam. Electronically Signed   By: Genia Del M.D.   On: 12/27/2016 10:34   Dg Chest Port 1 View  Result Date: 12/25/2016 CLINICAL DATA:  Shortness of breath. Right thoracentesis on 12/23/2016 EXAM: PORTABLE CHEST 1 VIEW COMPARISON:  12/23/2016 FINDINGS: Cardiomediastinal  silhouette is normal. Mediastinal contours appear intact. There is a decreased in size, still large right pleural effusion. Secondary atelectatic changes in the right lung. The aerated left lung demonstrates mild increase of the interstitial markings. Osseous structures are without acute abnormality. Soft tissues are grossly normal. IMPRESSION: Mild decrease in size of large right pleural effusion. No evidence of pneumothorax. Electronically Signed   By: Fidela Salisbury M.D.   On: 12/25/2016 07:34   Dg Chest Port 1 View  Result Date: 12/23/2016 CLINICAL DATA:  Post RIGHT thoracentesis, recurrent RIGHT pleural effusion, cirrhosis EXAM: PORTABLE CHEST 1 VIEW COMPARISON:  Portable exam 1204 hours compared to 12/22/2016 FINDINGS: Enlargement of cardiac silhouette with pulmonary vascular congestion. Large RIGHT pleural effusion despite preceding removal of 2.1 L of RIGHT pleural effusion. Significant atelectasis of RIGHT lung. Slight crowding of perihilar markings on LEFT likely related expiratory technique. LEFT lung grossly clear. No pneumothorax. IMPRESSION: No pneumothorax following RIGHT thoracentesis. Persistent large RIGHT pleural effusion and basilar atelectasis despite removal of 2.1 L of RIGHT pleural fluid. Electronically Signed   By: Lavonia Dana M.D.   On: 12/23/2016 12:45   Dg Chest Port 1 View  Result Date: 12/22/2016 CLINICAL DATA:  Cough for 4  days. EXAM: PORTABLE CHEST 1 VIEW COMPARISON:  Chest radiograph 12/06/2016. FINDINGS: Monitoring leads overlie the patient. Stable cardiomegaly. Interval increase in size of large right pleural effusion. Left lung is clear. Minimal aerated pulmonary tissue right lung apex. No pneumothorax. IMPRESSION: Interval increase in size of large right pleural effusion with minimal aerated pulmonary parenchyma right lung apex. Cardiomegaly. Electronically Signed   By: Lovey Newcomer M.D.   On: 12/22/2016 20:21   US Thoracentesis Asp Pleural Space W/img  Guide  Result Date: 01/13/2017 INDICATION: Cirrhosis, ascites, recurrent RIGHT PLEURAL EFFUSION EXAM: ULTRASOUND GUIDED THERAPEUTIC RIGHT THORACENTESIS MEDICATIONS: None. COMPLICATIONS: None immediate. PROCEDURE: Procedure, benefits, and risks of procedure were discussed with patient. Written informed consent for procedure was obtained. Time out protocol followed. Pleural effusion localized by ultrasound at the posterior RIGHT hemithorax. Skin prepped and draped in usual sterile fashion. Skin and soft tissues anesthetized with 10 mL of 1% lidocaine. 8 French thoracentesis catheter placed into the RIGHT pleural space. 1.2 L of yellow RIGHT pleural fluid aspirated by syringe pump. Procedure tolerated well by patient without immediate complication. Procedure was stopped following removal of 1.2 L of fluid due to decrease in blood pressure during the procedure. Patient tolerated procedure well without immediate complication and reported significant symptomatic improvement. FINDINGS: As above IMPRESSION: Successful ultrasound guided RIGHT thoracentesis yielding 1.2 L of pleural fluid. Electronically Signed   By: Lavonia Dana M.D.   On: 01/13/2017 14:08   US Thoracentesis Asp Pleural Space W/img Guide  Result Date: 01/06/2017 INDICATION: Right pleural effusion.  Cirrhosis. EXAM: ULTRASOUND GUIDED RIGHT THORACENTESIS MEDICATIONS: None. COMPLICATIONS: None immediate. PROCEDURE: An ultrasound guided thoracentesis was thoroughly discussed with the patient and questions answered. The benefits, risks, alternatives and complications were also discussed. The patient understands and wishes to proceed with the procedure. Written consent was obtained. Ultrasound was performed to localize and mark an adequate pocket of fluid in the right chest. The area was then prepped and draped in the normal sterile fashion. 1% Lidocaine was used for local anesthesia. Under ultrasound guidance a Yueh catheter was introduced. Thoracentesis  was performed. The catheter was removed and a dressing applied. FINDINGS: A total of approximately 2600 cc of clear yellow fluid was removed. Samples were sent to the laboratory as requested by the clinical team. IMPRESSION: Successful ultrasound guided right thoracentesis yielding 2,600 cc of pleural fluid. Electronically Signed   By: Lorriane Shire M.D.   On: 01/06/2017 11:06   US Thoracentesis Asp Pleural Space W/img Guide  Result Date: 12/28/2016 INDICATION: Symptomatic right sided pleural effusion EXAM: US THORACENTESIS ASP PLEURAL SPACE W/IMG GUIDE COMPARISON:  Previous thoracentesis. MEDICATIONS: 10 cc 1% lidocaine. COMPLICATIONS: None immediate. TECHNIQUE: Informed written consent was obtained from the patient after a discussion of the risks, benefits and alternatives to treatment. A timeout was performed prior to the initiation of the procedure. Initial ultrasound scanning demonstrates a right pleural effusion. The lower chest was prepped and draped in the usual sterile fashion. 1% lidocaine was used for local anesthesia. Under direct ultrasound guidance, a 19 gauge, 7-cm, Yueh catheter was introduced. An ultrasound image was saved for documentation purposes. The thoracentesis was performed. The catheter was removed and a dressing was applied. The patient tolerated the procedure well without immediate post procedural complication. The patient was escorted to have an upright chest radiograph. FINDINGS: A total of approximately 2.4 liters of yellow fluid was removed. IMPRESSION: Successful ultrasound-guided Rt sided thoracentesis yielding 2.4 liters of pleural fluid. Read by Lavonia Drafts [facility] Electronically  Signed   By: Lorriane Shire M.D.   On: 12/28/2016 13:10   US Thoracentesis Asp Pleural Space W/img Guide  Result Date: 12/23/2016 INDICATION: Recurrent RIGHT pleural effusion EXAM: ULTRASOUND GUIDED DIAGNOSTIC AND THERAPEUTIC RIGHT THORACENTESIS MEDICATIONS: None. COMPLICATIONS: None immediate.  PROCEDURE: Procedure, benefits, and risks of procedure were discussed with patient. Written informed consent for procedure was obtained. Time out protocol followed. Procedure performed in ICU. Pleural effusion localized by ultrasound at the posterior RIGHT hemithorax. Skin prepped and draped in usual sterile fashion. Skin and soft tissues anesthetized with 10 mL of 1% lidocaine. 8 French thoracentesis catheter placed into the RIGHT pleural space. 2.1 L of yellow RIGHT pleural fluid aspirated by syringe pump. Procedure tolerated well by patient without immediate complication. FINDINGS: A total of approximately 2.1 L of RIGHT pleural fluid was removed. Samples were sent to the laboratory as requested by the clinical team. IMPRESSION: Successful ultrasound guided RIGHT thoracentesis yielding 2.1 L of of pleural fluid. Electronically Signed   By: Lavonia Dana M.D.   On: 12/23/2016 12:43    Microbiology: Recent Results (from the past 240 hour(s))  Culture, body fluid-bottle     Status: None   Collection Time: 01/06/17 10:20 AM  Result Value Ref Range Status   Specimen Description PLEURAL COLLECTED BY DOCTOR DR MAXWELL  Final   Special Requests BOTTLES DRAWN AEROBIC AND ANAEROBIC 10CC EACH  Final   Culture NO GROWTH 5 DAYS  Final   Report Status 01/11/2017 FINAL  Final  Gram stain     Status: None   Collection Time: 01/06/17 10:52 AM  Result Value Ref Range Status   Specimen Description PLEURAL  Final   Special Requests NONE  Final   Gram Stain   Final    WBC PRESENT, PREDOMINANTLY MONONUCLEAR NO ORGANISMS SEEN CYTOSPIN SMEAR    Report Status 01/06/2017 FINAL  Final     Labs: Basic Metabolic Panel: Recent Labs  Lab 01/12/17 0959 01/13/17 0508  NA 138 137  K 3.9 4.0  CL 103 104  CO2 23 25  GLUCOSE 123* 104*  BUN 15 18  CREATININE 0.87 1.06  CALCIUM 8.1* 7.4*   Liver Function Tests: Recent Labs  Lab 01/12/17 0959 01/13/17 0508  AST 59* 48*  ALT 28 21  ALKPHOS 168* 133*  BILITOT  4.6* 4.0*  PROT 7.0 5.7*  ALBUMIN 3.1* 2.5*   No results for input(s): LIPASE, AMYLASE in the last 168 hours. No results for input(s): AMMONIA in the last 168 hours. CBC: Recent Labs  Lab 01/12/17 0959 01/13/17 0508  WBC 5.5 5.6  HGB 13.3 11.1*  HCT 40.3 33.3*  MCV 106.6* 107.1*  PLT 76* 71*   Cardiac Enzymes: No results for input(s): CKTOTAL, CKMB, CKMBINDEX, TROPONINI in the last 168 hours. BNP: BNP (last 3 results) Recent Labs    08/03/16 2131 11/09/16 1048 12/22/16 1957  BNP 216.0* 725.0* 331.0*    ProBNP (last 3 results) No results for input(s): PROBNP in the last 8760 hours.  CBG: No results for input(s): GLUCAP in the last 168 hours.     Signed:  Lelon Frohlich  [facility] Pager: [phone] 01/13/2017, 5:53 PM

## 2017-01-13 NOTE — Care Management Important Message (Signed)
Important Message  Patient Details  Name: [name] MRN: [id number] Date of Birth: 26-Mar-1960   Medicare Important Message Given:  Yes    Kenderick Kobler, Chauncey Reading, RN 01/13/2017, 2:56 PM

## 2017-01-13 NOTE — Sedation Documentation (Signed)
Patient denies pain and is resting comfortably.  

## 2017-01-13 NOTE — Sedation Documentation (Signed)
3L yellow fluid drained without difficulty. Pt tolerated with no issues

## 2017-01-13 NOTE — Sedation Documentation (Signed)
1200cc of yellow fluid drained from lung without difficulty. Pt had no issues during procedure and tolerated well/

## 2017-01-13 NOTE — Procedures (Signed)
PreOperative Dx: Cirrhosis, ascites, recurrent RIGHT pleural effusion Postoperative Dx: Cirrhosis, ascites, recurrent RIGHT pleural effusion Procedure:   US guided paracentesis, US guided RIGHT thoracentesis Radiologist:  Thornton Papas Anesthesia:  20 ml of1% lidocaine total for the 2 procedures Specimen:  3 L of yellow ascitic fluid, 1.2 L of yellow RIGHT pleural fluid EBL:   < 1 ml Complications: None

## 2017-01-25 ENCOUNTER — Inpatient Hospital Stay: Admission: RE | Admit: 2017-01-25 | Payer: Medicare Other | Source: Ambulatory Visit

## 2017-02-08 LAB — ACID FAST CULTURE WITH REFLEXED SENSITIVITIES (MYCOBACTERIA): Acid Fast Culture: NEGATIVE

## 2017-02-14 DEATH — deceased

## 2017-09-04 IMAGING — US US ABDOMEN COMPLETE W/ ELASTOGRAPHY
2 series · 13 of 25 positions shown · non-contrast
Comparison: 07/30/2010.

CLINICAL DATA: Elevated LFTs.  Cirrhosis.



[Series 1: us abdomen complete w/ elastography · 0.21mm/px · 11 of 120 slices shown (1 of 2)]
[im 1/120]
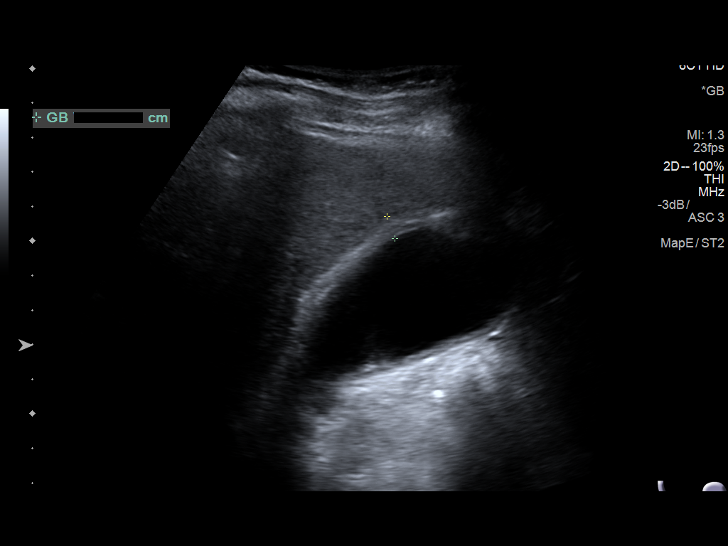
[im 12/120]
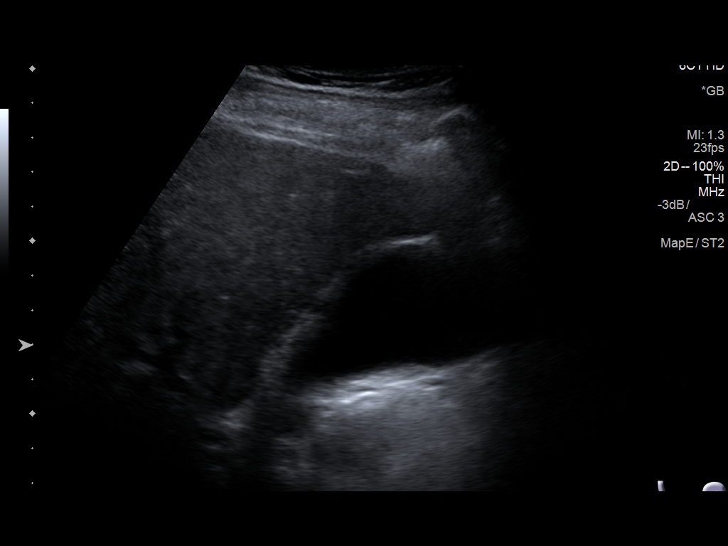
[im 23/120]
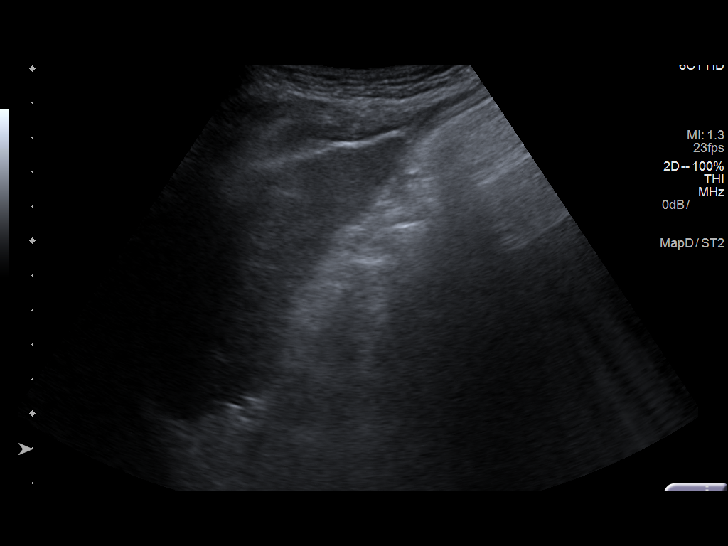
[im 35/120]
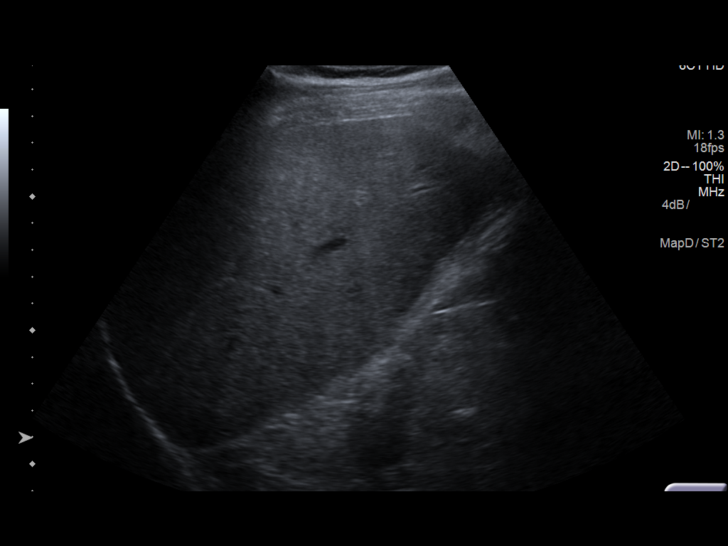
[im 46/120]
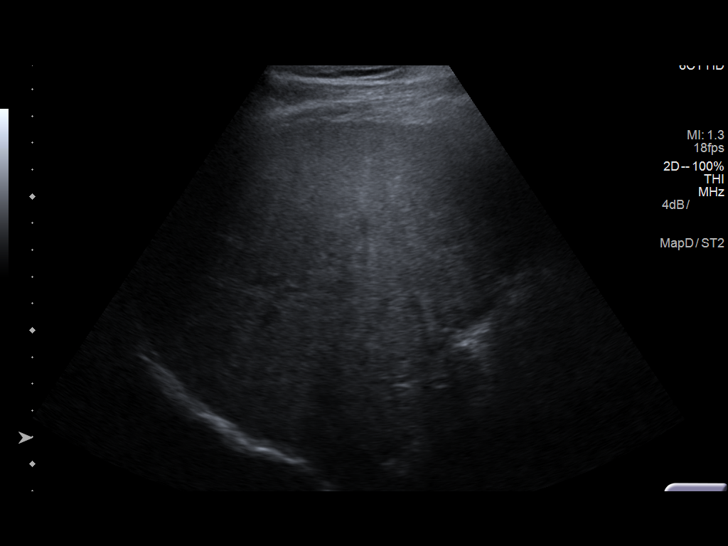
[im 57/120]
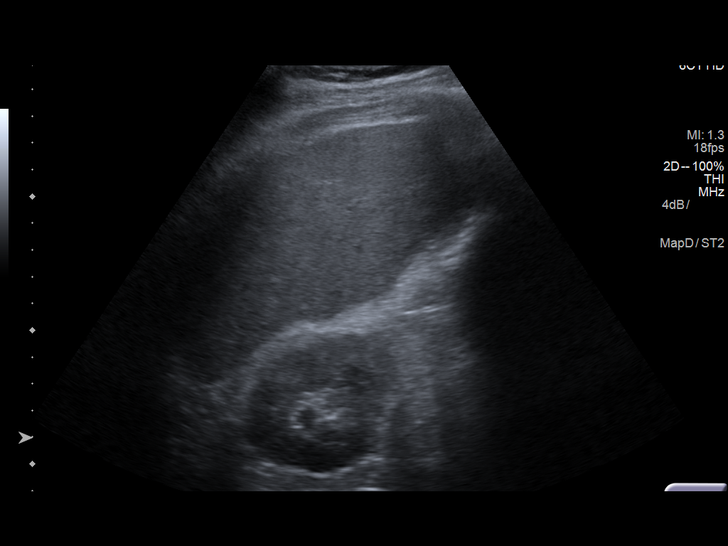
[im 69/120]
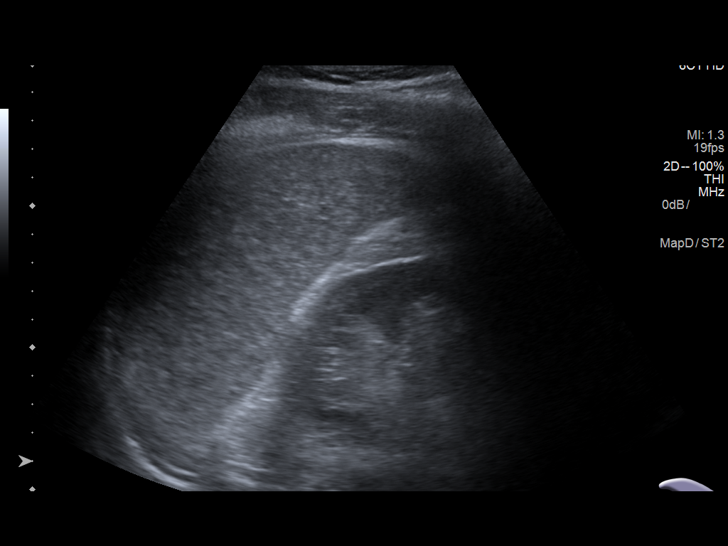
[im 80/120]
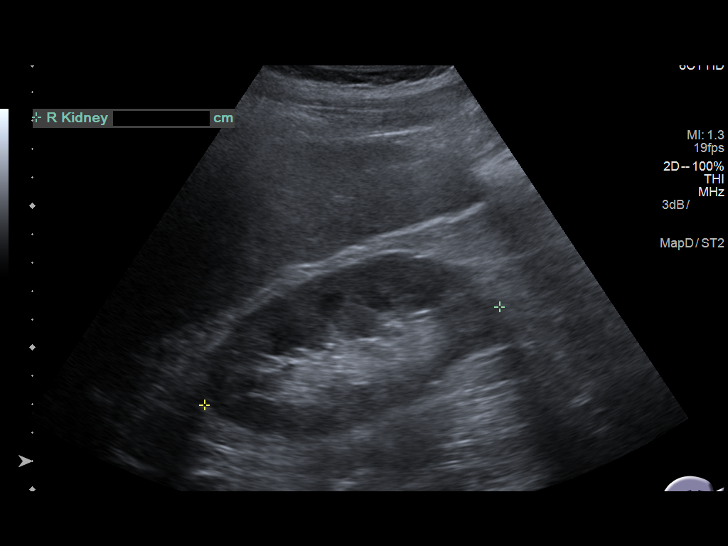
[im 91/120]
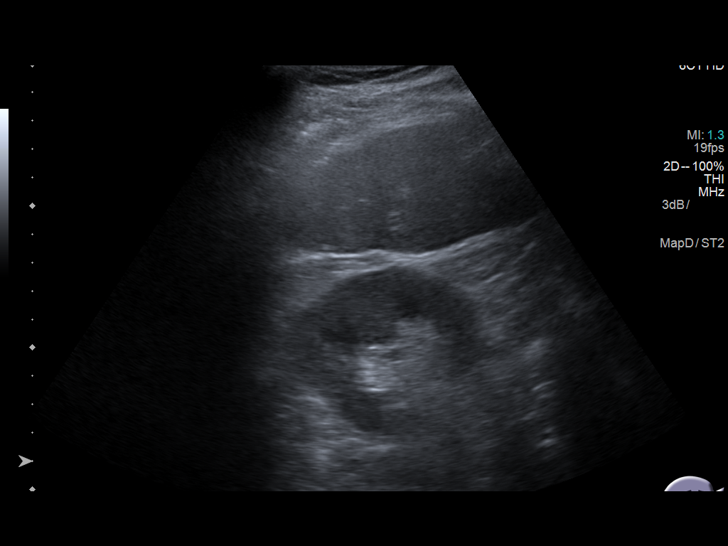
[im 103/120]
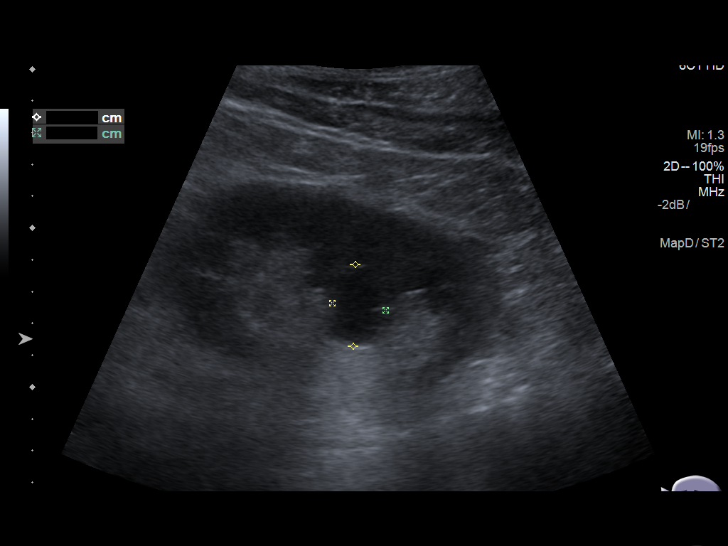
[im 114/120]
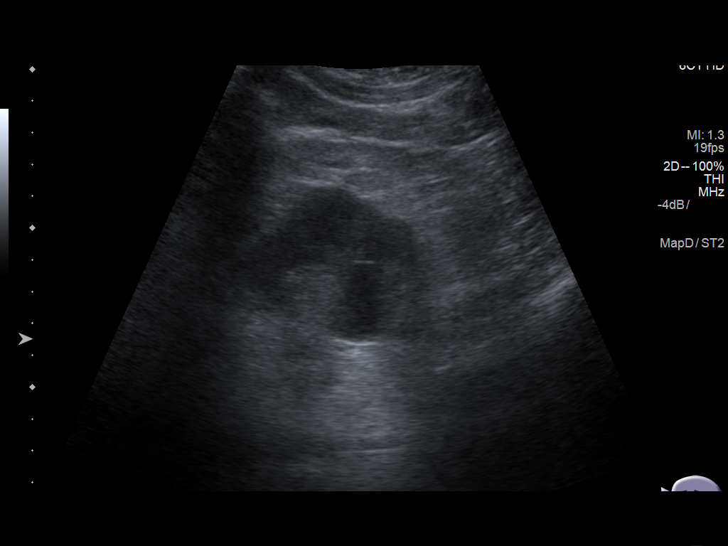

[Series 2001: us abdomen complete w/ elastography · 0.18mm/px · 2 of 17 slices shown (2 of 2)]
[im 1/17]
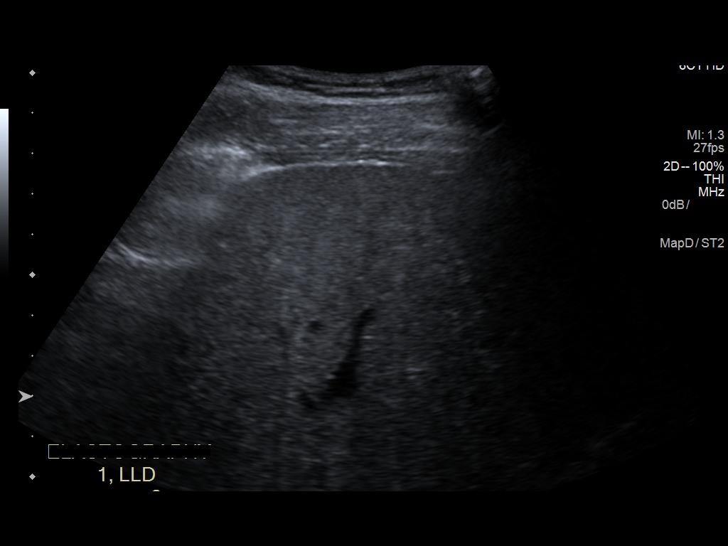
[im 17/17]
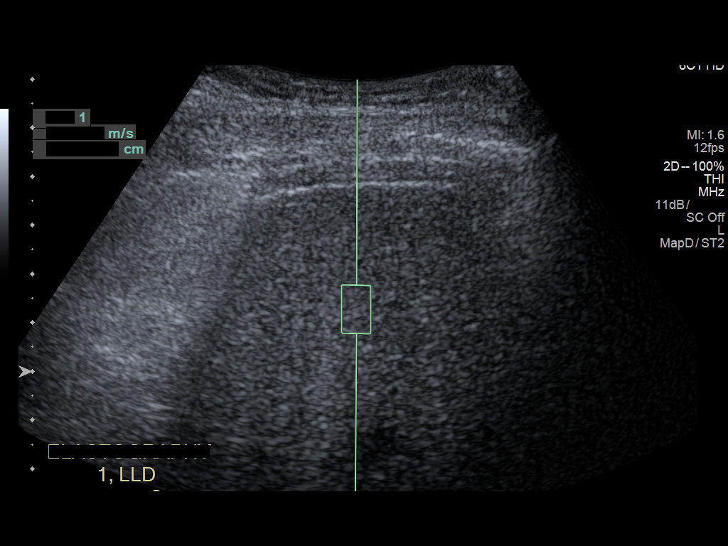

[13 of 25 positions shown; findings below may reference images not displayed]

FINDINGS: ULTRASOUND ABDOMEN

Gallbladder: Bladder wall thickening identified measuring up to
mm. No gallstones or sludge. Negative sonographic Murphy's sign.

Common bile duct: Diameter: 3.8 mm.

Liver: Liver has a diffusely nodular contour or compatible with
cirrhosis. No focal liver abnormality noted.

IVC: Poorly visualize.

Pancreas: Diminished exam detail.

Spleen: Measures 12 cm in length.

Right Kidney: Length: 11.0 cm.. Echogenicity within normal limits.
No mass or hydronephrosis visualized.

Left Kidney: Length: 10.9 cm. Cyst is identified within the midpole
measuring 2.6 x 1.7 x 1.8 cm. Echogenicity within normal limits. No
mass or hydronephrosis visualized.

Abdominal aorta: No aneurysm visualized.

Other findings: None.

ULTRASOUND HEPATIC ELASTOGRAPHY

Device: Siemens Helix VTQ

Patient position: Left lateral decubitus

Transducer 6 C2

Number of measurements:  10

Hepatic Segment:  8

Median velocity:   4.19  m/sec

IQR:

IQR/Median velocity ratio

Corresponding Metavir fibrosis score:  F3 and F4

Risk of fibrosis: High

Limitations of exam: None

Pertinent findings noted on other imaging exams: The morphologic
features of the liver are compatible with cirrhosis.

Please note that abnormal shear wave velocities may also be
identified in clinical settings other than with hepatic fibrosis,
such as: acute hepatitis, elevated right heart and central venous
pressures including use of beta blockers, Esperance disease
(Felner), infiltrative processes such as
mastocytosis/amyloidosis/infiltrative tumor, extrahepatic
cholestasis, in the post-prandial state, and liver transplantation.
Correlation with patient history, laboratory data, and clinical
condition recommended.
IMPRESSION: 1. Cirrhosis.
2. Diffuse gallbladder wall thickening. This may be a nonspecific
finding in the setting of cirrhosis with portal venous hypertension.

Median hepatic shear wave velocity is calculated at 4.19 m/sec.

Corresponding Metavir fibrosis score is F3 and F4.

Risk of fibrosis is high.

Follow-up:  Followup advise.
